# Patient Record
Sex: Male | Born: 1970
Health system: Southern US, Community
[De-identification: ages and names within clinical notes are randomized; demographics above are authoritative.]

## PROBLEM LIST (undated history)

## (undated) DIAGNOSIS — E559 Vitamin D deficiency, unspecified: Secondary | ICD-10-CM

## (undated) DIAGNOSIS — N189 Chronic kidney disease, unspecified: Secondary | ICD-10-CM

## (undated) DIAGNOSIS — I10 Essential (primary) hypertension: Secondary | ICD-10-CM

## (undated) HISTORY — PX: OTHER SURGICAL HISTORY: SHX169

## (undated) HISTORY — DX: Vitamin D deficiency, unspecified: E55.9

## (undated) HISTORY — DX: Essential (primary) hypertension: I10

## (undated) HISTORY — DX: Chronic kidney disease, unspecified: N18.9

---

## 2016-08-06 ENCOUNTER — Emergency Department (HOSPITAL_COMMUNITY)
Admission: EM | Admit: 2016-08-06 | Discharge: 2016-08-06 | Disposition: A | Discharge status: Home / Self Care | Payer: Self-pay | Attending: Emergency Medicine | Admitting: Emergency Medicine

## 2016-08-06 ENCOUNTER — Emergency Department (HOSPITAL_COMMUNITY): Payer: Self-pay

## 2016-08-06 DIAGNOSIS — I160 Hypertensive urgency: Secondary | ICD-10-CM | POA: Insufficient documentation

## 2016-08-06 DIAGNOSIS — I161 Hypertensive emergency: Secondary | ICD-10-CM

## 2016-08-06 DIAGNOSIS — M542 Cervicalgia: Secondary | ICD-10-CM | POA: Insufficient documentation

## 2016-08-06 LAB — CBC WITH DIFFERENTIAL/PLATELET
Basophils Absolute: 0 10*3/uL (ref 0.0–0.1)
Basophils Relative: 0 %
EOS ABS: 0.1 10*3/uL (ref 0.0–0.7)
Eosinophils Relative: 2 %
HCT: 42.8 % (ref 39.0–52.0)
HEMOGLOBIN: 14.2 g/dL (ref 13.0–17.0)
LYMPHS ABS: 1.8 10*3/uL (ref 0.7–4.0)
Lymphocytes Relative: 38 %
MCH: 27.3 pg (ref 26.0–34.0)
MCHC: 33.2 g/dL (ref 30.0–36.0)
MCV: 82.3 fL (ref 78.0–100.0)
MONOS PCT: 11 %
Monocytes Absolute: 0.6 10*3/uL (ref 0.1–1.0)
NEUTROS ABS: 2.4 10*3/uL (ref 1.7–7.7)
NEUTROS PCT: 49 %
Platelets: 260 10*3/uL (ref 150–400)
RBC: 5.2 MIL/uL (ref 4.22–5.81)
RDW: 14.2 % (ref 11.5–15.5)
WBC: 4.9 10*3/uL (ref 4.0–10.5)

## 2016-08-06 LAB — BASIC METABOLIC PANEL
Anion gap: 8 (ref 5–15)
BUN: 15 mg/dL (ref 6–20)
CHLORIDE: 103 mmol/L (ref 101–111)
CO2: 25 mmol/L (ref 22–32)
Calcium: 9.3 mg/dL (ref 8.9–10.3)
Creatinine, Ser: 1.58 mg/dL — ABNORMAL HIGH (ref 0.61–1.24)
GFR calc Af Amer: 59 mL/min — ABNORMAL LOW (ref >60)
GFR calc non Af Amer: 51 mL/min — ABNORMAL LOW (ref >60)
Glucose, Bld: 115 mg/dL — ABNORMAL HIGH (ref 65–99)
POTASSIUM: 4 mmol/L (ref 3.5–5.1)
SODIUM: 136 mmol/L (ref 135–145)

## 2016-08-06 LAB — I-STAT TROPONIN, ED: TROPONIN I, POC: 0.01 ng/mL (ref 0.00–0.08)

## 2016-08-06 LAB — URINALYSIS, ROUTINE W REFLEX MICROSCOPIC
Bilirubin Urine: NEGATIVE
Glucose, UA: NEGATIVE mg/dL
Hgb urine dipstick: NEGATIVE
Ketones, ur: NEGATIVE mg/dL
Leukocytes, UA: NEGATIVE
NITRITE: NEGATIVE
PH: 6 (ref 5.0–8.0)
Protein, ur: NEGATIVE mg/dL
SPECIFIC GRAVITY, URINE: 1.01 (ref 1.005–1.030)

## 2016-08-06 MED ORDER — CLONIDINE HCL 0.2 MG PO TABS
0.2000 mg | ORAL_TABLET | Freq: Once | ORAL | Status: AC
  Administered 2016-08-06: 0.2 mg via ORAL
  Filled 2016-08-06: qty 1

## 2016-08-06 MED ORDER — AMLODIPINE BESYLATE 5 MG PO TABS: 5.0000 mg | ORAL_TABLET | Freq: Every day | ORAL | 0 refills | Status: DC

## 2016-08-06 NOTE — ED Provider Notes (Signed)
[facility] DEPT Provider Note   CSN: [id number] Arrival date & time: 08/06/16  1059     History   Chief Complaint Chief Complaint  Patient presents with  . Generalized Body Aches  . Headache  . Neck Pain    HPI David [name] is a 46 y.o. male.  The history is provided by the patient and medical records. No language interpreter was used.  Headache    Neck Pain   Associated symptoms include headaches. Pertinent negatives include no numbness and no weakness.   David [name] is a 46 y.o. male who presents to the Emergency Department complaining of headache and neck pain that began this morning. Patient states that when he woke up this morning, he didn't feel well. He states that he just felt achy all over, most notably headache and neck pain. Body aches and neck pain feel improved, but headache has been persistent. Headache is aching, left sided. Worse when leaning forward. No visual changes, chest pain, shortness of breath, abdominal pain, n/v, numbness, tingling, weakness, slurred speech, syncopal episodes. No medications taken prior to arrival. He has been told he had high blood pressure back in 2013 in [place]. No other known medical problems. He was started on medication which he took for a month or two, but quit taking it and has not followed up on this. Just moved to [place] and working on getting [employer]. + ppd smoker. No ETOH or elicit drug use.  No past medical history on file.  There are no active problems to display for this patient.   No past surgical history on file.     Home Medications    Prior to Admission medications   Medication Sig Start Date End Date Taking? Authorizing Provider  acetaminophen (TYLENOL) 500 MG tablet Take 1,000 mg by mouth every 6 (six) hours as needed for headache.   Yes [provider]  amLODipine (NORVASC) 5 MG tablet Take 1 tablet (5 mg total) by mouth daily. Take 2.5 mg (half tablet) by mouth daily for 3 days, then  increase to 5 mg (1 tablet) by mouth daily. 08/06/16   Ward, Ozella Almond, PA-C    Family History No family history on file.  Social History Social History  Substance Use Topics  . Smoking status: Not on file  . Smokeless tobacco: Not on file  . Alcohol use Not on file     Allergies   Patient has no known allergies.   Review of Systems Review of Systems  Musculoskeletal: Positive for neck pain.  Neurological: Positive for headaches. Negative for dizziness, syncope, speech difficulty, weakness and numbness.     Physical Exam Updated Vital Signs BP (!) 193/111   Pulse (!) 51   Temp 98.3 F (36.8 C) (Oral)   Resp 16   Ht 5\' 11"  (1.803 m)   Wt 136.1 kg (300 lb)   SpO2 95%   BMI 41.84 kg/m   Physical Exam  Constitutional: He is oriented to person, place, and time. He appears well-developed and well-nourished. No distress.  HENT:  Head: Normocephalic and atraumatic.  Cardiovascular: Normal rate, regular rhythm and normal heart sounds.   No murmur heard. Pulmonary/Chest: Effort normal and breath sounds normal. No respiratory distress. He has no wheezes. He has no rales.  Abdominal: Soft. He exhibits no distension. There is no tenderness.  Musculoskeletal: He exhibits no edema.  Neurological: He is alert and oriented to person, place, and time.  Alert, oriented, thought content appropriate, able to  give a coherent history. Speech is clear and goal oriented, able to follow commands.  Cranial Nerves:  II:  Peripheral visual fields grossly normal, pupils equal, round, reactive to light III, IV, VI: EOM intact bilaterally, ptosis not present V,VII: smile symmetric, eyes kept closed tightly against resistance, facial light touch sensation equal VIII: hearing grossly normal IX, X: symmetric soft palate movement, uvula elevates symmetrically  XI: bilateral shoulder shrug symmetric and strong XII: midline tongue extension 5/5 muscle strength in upper and lower extremities  bilaterally including strong and equal grip strength and dorsiflexion/plantar flexion Sensory to light touch normal in all four extremities.  Normal finger-to-nose and rapid alternating movements; no drift.  Skin: Skin is warm and dry.  Nursing note and vitals reviewed.    ED Treatments / Results  Labs (all labs ordered are listed, but only abnormal results are displayed) Labs Reviewed  BASIC METABOLIC PANEL - Abnormal; Notable for the following:       Result Value   Glucose, Bld 115 (*)    Creatinine, Ser 1.58 (*)    GFR calc non Af Amer 51 (*)    GFR calc Af Amer 59 (*)    All other components within normal limits  URINALYSIS, ROUTINE W REFLEX MICROSCOPIC - Abnormal; Notable for the following:    Color, Urine STRAW (*)    All other components within normal limits  CBC WITH DIFFERENTIAL/PLATELET  I-STAT TROPONIN, ED    EKG  EKG Interpretation None       Radiology Ct Head Wo Contrast  Result Date: 08/06/2016 CLINICAL DATA:  Headache and elevated blood pressure. EXAM: CT HEAD WITHOUT CONTRAST TECHNIQUE: Contiguous axial images were obtained from the base of the skull through the vertex without intravenous contrast. COMPARISON:  None. FINDINGS: Brain: No evidence of acute infarction, hemorrhage, hydrocephalus, extra-axial collection or mass lesion/mass effect. Vascular: No hyperdense vessel or unexpected calcification. Skull: Normal. Negative for fracture or focal lesion. Sinuses/Orbits: No acute finding. Other: None. IMPRESSION: 1. No acute intracranial abnormalities identified.  Normal brain. Electronically Signed   By: Kerby Moors M.D.   On: 08/06/2016 18:24    Procedures Procedures (including critical care time)  Medications Ordered in ED Medications  cloNIDine (CATAPRES) tablet 0.2 mg (0.2 mg Oral Given 08/06/16 1536)     Initial Impression / Assessment and Plan / ED Course  I have reviewed the triage vital signs and the nursing notes.  Pertinent labs & imaging  results that were available during my care of the patient were reviewed by me and considered in my medical decision making (see chart for details).    David [name] is a 46 y.o. male who presents to ED for headache. Hx of HTN and started on medication in [place] back in 2013. Unfortunately he does not know name of medication and took it only for a month or two, then never followed up on BP. Blood pressure of 200/115 upon arrival with pulse of 79. Afebrile. No neuro deficits, however does endorse headache. CT head negative. BMP with creatinine of 1.58, and no baseline comparison. Troponin negative. No protein in urine. Catapres given in ED with improvement of blood pressure to 175/108. Strongly encouraged patient to be admitted for hypertensive urgency. Patient is self-pay and trying to get Medicaid in [place]. He is very concerned about financial cost of admission. I have discussed my concerns as a provider and the possibility that this may worsen. We discussed the nature, risks and benefits to admission. Time was  given to allow the opportunity to ask questions and consider the options and after the discussion, the patient decided to refuse admission. Pt is A&Ox4, his own POA and states understanding of my concerns and the possible consequences. I spoke at length about reasons to return to emergency department immediately and that he could return at any time. Will start patient on Norvasc. Patient does not have a PCP in the area yet. I put in a consult for case management in the morning. Hopefully they can assist with getting PCP appointment sooner. PCP resources provided as well. All questions answered.   Patient discussed with Dr. Sherry Ruffing who agrees with treatment plan.    Final Clinical Impressions(s) / ED Diagnoses   Final diagnoses:  Hypertensive emergency    New Prescriptions New Prescriptions   AMLODIPINE (NORVASC) 5 MG TABLET    Take 1 tablet (5 mg total) by mouth daily. Take 2.5 mg  (half tablet) by mouth daily for 3 days, then increase to 5 mg (1 tablet) by mouth daily.     Ward, Ozella Almond, PA-C 08/06/16 2058    Tegeler, Gwenyth Allegra, MD 08/07/16 1236

## 2016-08-06 NOTE — Discharge Instructions (Signed)
It was my pleasure taking care of you today!    Your blood pressure was extremely elevated today. I recommended that you be admitted to the hospital, but understand your concerns. We are starting you on blood pressure medication but it is VERY important that you return to the ER for chest pain, trouble breathing, worsening/return of headache, new symptoms or any additional concerns.   You will also need to follow up with a primary care provider. Our case manager should be trying to get you an appointment, so be expecting a phone call some time Monday or Tuesday. If you do not hear from them, please see the information below to call and schedule an appointment.   To find a primary care or specialty doctor please call [phone] or [phone] to access "[facility] Doctor Service."  You may also go on the [facility] website at [email or web address]  There are also multiple [facility] practices throughout the [place] that are frequently accepting new patients. You may find a clinic that is close to your home and contact them.  [facility] - [place] [phone]  [facility] in [place] (also locations in [place] and [place]) - [place] [phone]  [place] [phone]

## 2016-08-06 NOTE — ED Triage Notes (Signed)
Pt. Stated, I woke up this morning with my body hurt all over , my neck and head.

## 2016-08-06 NOTE — ED Notes (Signed)
Pt resting at this time, st's headache is better.  No complaints voiced.

## 2016-08-06 NOTE — ED Notes (Signed)
Pt currently in bathroom

## 2016-08-06 NOTE — ED Notes (Signed)
Pt resting at this time with eyes closed 

## 2016-08-08 ENCOUNTER — Telehealth: Payer: Self-pay | Admitting: Emergency Medicine

## 2016-08-08 NOTE — Telephone Encounter (Signed)
CM consulted on pt with no ins and no PCP.  Called pt, [phone], who verified no ins or PCP and was agreeable with help to establish PCP.  Pt chose [facility] for follow up of the 3 clinic choices.  Appointment made for pt on Monday Aug 6th at 1 pm. Called pt back with appointment time.  No further CM needs noted at this time.

## 2016-08-15 ENCOUNTER — Encounter: Payer: Self-pay | Admitting: Family Medicine

## 2016-08-15 ENCOUNTER — Ambulatory Visit (INDEPENDENT_AMBULATORY_CARE_PROVIDER_SITE_OTHER): Payer: Self-pay | Admitting: Family Medicine

## 2016-08-15 VITALS — BP 140/96 | HR 68 | Temp 97.6°F | Resp 14 | Ht 71.0 in | Wt 226.0 lb

## 2016-08-15 DIAGNOSIS — I10 Essential (primary) hypertension: Secondary | ICD-10-CM

## 2016-08-15 DIAGNOSIS — R809 Proteinuria, unspecified: Secondary | ICD-10-CM

## 2016-08-15 LAB — LIPID PANEL
CHOL/HDL RATIO: 4.2 ratio (ref <5.0)
CHOLESTEROL: 202 mg/dL — AB (ref <200)
HDL: 48 mg/dL (ref >40)
LDL Cholesterol: 134 mg/dL — ABNORMAL HIGH (ref <100)
Triglycerides: 100 mg/dL (ref <150)
VLDL: 20 mg/dL (ref <30)

## 2016-08-15 LAB — POCT URINALYSIS DIP (DEVICE)
Bilirubin Urine: NEGATIVE
Glucose, UA: NEGATIVE mg/dL
HGB URINE DIPSTICK: NEGATIVE
Ketones, ur: NEGATIVE mg/dL
Leukocytes, UA: NEGATIVE
NITRITE: NEGATIVE
PH: 7 (ref 5.0–8.0)
PROTEIN: 30 mg/dL — AB
Specific Gravity, Urine: 1.015 (ref 1.005–1.030)
Urobilinogen, UA: 0.2 mg/dL (ref 0.0–1.0)

## 2016-08-15 LAB — BASIC METABOLIC PANEL
BUN: 13 mg/dL (ref 7–25)
CO2: 25 mmol/L (ref 20–32)
Calcium: 9.4 mg/dL (ref 8.6–10.3)
Chloride: 103 mmol/L (ref 98–110)
Creat: 1.38 mg/dL — ABNORMAL HIGH (ref 0.60–1.35)
GLUCOSE: 95 mg/dL (ref 65–99)
Potassium: 4.6 mmol/L (ref 3.5–5.3)
SODIUM: 137 mmol/L (ref 135–146)

## 2016-08-15 LAB — POCT GLYCOSYLATED HEMOGLOBIN (HGB A1C): HEMOGLOBIN A1C: 5.6

## 2016-08-15 MED ORDER — LISINOPRIL 10 MG PO TABS: 20.0000 mg | ORAL_TABLET | Freq: Every day | ORAL | 1 refills | Status: DC

## 2016-08-15 MED ORDER — AMLODIPINE BESYLATE 10 MG PO TABS: 10.0000 mg | ORAL_TABLET | Freq: Every day | ORAL | 1 refills | Status: DC

## 2016-08-15 MED ORDER — ASPIRIN EC 81 MG PO TBEC: 81.0000 mg | DELAYED_RELEASE_TABLET | Freq: Every day | ORAL | 2 refills | Status: DC

## 2016-08-15 MED ORDER — LISINOPRIL 10 MG PO TABS: 10.0000 mg | ORAL_TABLET | Freq: Every day | ORAL | 1 refills | Status: DC

## 2016-08-15 MED FILL — AMLODIPINE BESYLATE 10 MG T: 10 | 90 days supply | Qty: 90 | Fill #0

## 2016-08-15 MED FILL — LISINOPRIL 10 MG TABLET: 10 | 90 days supply | Qty: 180 | Fill #0

## 2016-08-15 NOTE — Patient Instructions (Addendum)
Hypertension: I am increasing your Amlodipine 10 mg once daily. I am also started on lisinopril 20 mg once daily for blood pressure. Cardiovascular health began taking 81 mg of aspirin once daily. I encourage you to stop smoking and increase vigorous physical activity, to a minimum 150 minutes per week.     Steps to Quit Smoking Smoking tobacco can be bad for your health. It can also affect almost every organ in your body. Smoking puts you and people around you at risk for many serious long-lasting (chronic) diseases. Quitting smoking is hard, but it is one of the best things that you can do for your health. It is never too late to quit. What are the benefits of quitting smoking? When you quit smoking, you lower your risk for getting serious diseases and conditions. They can include:  Lung cancer or lung disease.  Heart disease.  Stroke.  Heart attack.  Not being able to have children (infertility).  Weak bones (osteoporosis) and broken bones (fractures).  If you have coughing, wheezing, and shortness of breath, those symptoms may get better when you quit. You may also get sick less often. If you are pregnant, quitting smoking can help to lower your chances of having a baby of low birth weight. What can I do to help me quit smoking? Talk with your doctor about what can help you quit smoking. Some things you can do (strategies) include:  Quitting smoking totally, instead of slowly cutting back how much you smoke over a period of time.  Going to in-person counseling. You are more likely to quit if you go to many counseling sessions.  Using resources and support systems, such as: ? Database administrator with a Social worker. ? Phone quitlines. ? Careers information officer. ? Support groups or group counseling. ? Text messaging programs. ? Mobile phone apps or applications.  Taking medicines. Some of these medicines may have nicotine in them. If you are pregnant or breastfeeding, do not take  any medicines to quit smoking unless your doctor says it is okay. Talk with your doctor about counseling or other things that can help you.  Talk with your doctor about using more than one strategy at the same time, such as taking medicines while you are also going to in-person counseling. This can help make quitting easier. What things can I do to make it easier to quit? Quitting smoking might feel very hard at first, but there is a lot that you can do to make it easier. Take these steps:  Talk to your family and friends. Ask them to support and encourage you.  Call phone quitlines, reach out to support groups, or work with a Social worker.  Ask people who smoke to not smoke around you.  Avoid places that make you want (trigger) to smoke, such as: ? Bars. ? Parties. ? Smoke-break areas at work.  Spend time with people who do not smoke.  Lower the stress in your life. Stress can make you want to smoke. Try these things to help your stress: ? Getting regular exercise. ? Deep-breathing exercises. ? Yoga. ? Meditating. ? Doing a body scan. To do this, close your eyes, focus on one area of your body at a time from head to toe, and notice which parts of your body are tense. Try to relax the muscles in those areas.  Download or buy apps on your mobile phone or tablet that can help you stick to your quit plan. There are many free apps, such  as QuitGuide from the [employer] Office manager for Disease Control and Prevention). You can find more support from smokefree.gov and other websites.  This information is not intended to replace advice given to you by your health care provider. Make sure you discuss any questions you have with your health care provider. Document Released: 10/23/2008 Document Revised: 08/25/2015 Document Reviewed: 05/13/2014 Elsevier Interactive Patient Education  2018 [employer].  Hypertension Hypertension is another name for high blood pressure. High blood pressure forces your heart  to work harder to pump blood. This can cause problems over time. There are two numbers in a blood pressure reading. There is a top number (systolic) over a bottom number (diastolic). It is best to have a blood pressure below 120/80. Healthy choices can help lower your blood pressure. You may need medicine to help lower your blood pressure if:  Your blood pressure cannot be lowered with healthy choices.  Your blood pressure is higher than 130/80.  Follow these instructions at home: Eating and drinking  If directed, follow the DASH eating plan. This diet includes: ? Filling half of your plate at each meal with fruits and vegetables. ? Filling one quarter of your plate at each meal with whole grains. Whole grains include whole wheat pasta, brown rice, and whole grain bread. ? Eating or drinking low-fat dairy products, such as skim milk or low-fat yogurt. ? Filling one quarter of your plate at each meal with low-fat (lean) proteins. Low-fat proteins include fish, skinless chicken, eggs, beans, and tofu. ? Avoiding fatty meat, cured and processed meat, or chicken with skin. ? Avoiding premade or processed food.  Eat less than 1,500 mg of salt (sodium) a day.  Limit alcohol use to no more than 1 drink a day for nonpregnant women and 2 drinks a day for men. One drink equals 12 oz of beer, 5 oz of wine, or 1 oz of hard liquor. Lifestyle  Work with your doctor to stay at a healthy weight or to lose weight. Ask your doctor what the best weight is for you.  Get at least 30 minutes of exercise that causes your heart to beat faster (aerobic exercise) most days of the week. This may include walking, swimming, or biking.  Get at least 30 minutes of exercise that strengthens your muscles (resistance exercise) at least 3 days a week. This may include lifting weights or pilates.  Do not use any products that contain nicotine or tobacco. This includes cigarettes and e-cigarettes. If you need help  quitting, ask your doctor.  Check your blood pressure at home as told by your doctor.  Keep all follow-up visits as told by your doctor. This is important. Medicines  Take over-the-counter and prescription medicines only as told by your doctor. Follow directions carefully.  Do not skip doses of blood pressure medicine. The medicine does not work as well if you skip doses. Skipping doses also puts you at risk for problems.  Ask your doctor about side effects or reactions to medicines that you should watch for. Contact a doctor if:  You think you are having a reaction to the medicine you are taking.  You have headaches that keep coming back (recurring).  You feel dizzy.  You have swelling in your ankles.  You have trouble with your vision. Get help right away if:  You get a very bad headache.  You start to feel confused.  You feel weak or numb.  You feel faint.  You get very bad pain  in your: ? Chest. ? Belly (abdomen).  You throw up (vomit) more than once.  You have trouble breathing. Summary  Hypertension is another name for high blood pressure.  Making healthy choices can help lower blood pressure. If your blood pressure cannot be controlled with healthy choices, you may need to take medicine. This information is not intended to replace advice given to you by your health care provider. Make sure you discuss any questions you have with your health care provider. Document Released: 06/15/2007 Document Revised: 11/25/2015 Document Reviewed: 11/25/2015 Elsevier Interactive Patient Education  [employer].

## 2016-08-15 NOTE — Progress Notes (Signed)
Patient ID: David [name], male    DOB: 1970/11/28, 46 y.o.   MRN: [id number]  PCP: Scot Jun, FNP  Chief Complaint  Patient presents with  . Establish Care  . Hospitalization Follow-up    blood pressure    Subjective:  HPI David [name] is a 46 y.o. male presents to establish care and hospital follow-up. Pankaj Ruben reports at least a 10 year history of hypertension. He is current, everyday smoker. He previously was able to acquire medications from his home country, however ran out sometime ago. He is uncertain of medication he was previously prescribed for blood pressure control. David [name] presented to the emergency department on 08/06/2016 with a complaint of headache with associated neck pain. While at the ED, patient blood pressure was 193/111. He had a CT of head which was negative of acute findings. [name] was prescribed amlodipine 5 mg and discharged home. Denies experiencing any side effects of medication. He reports today improvement of headache. He is has not experienced any dizziness or visual disturbances.  Social History   Social History  . Marital status: Married    Spouse name: N/A  . Number of children: N/A  . Years of education: N/A   Occupational History  . Not on file.   Social History Main Topics  . Smoking status: Current Every Day Smoker  . Smokeless tobacco: Never Used  . Alcohol use No  . Drug use: No  . Sexual activity: Not on file   Other Topics Concern  . Not on file   Social History Narrative  . No narrative on file   Family History  Problem Relation Age of Onset  . Family history unknown: Yes   Review of Systems See HPI Prior to Admission medications   Medication Sig Start Date End Date Taking? Authorizing Provider  acetaminophen (TYLENOL) 500 MG tablet Take 1,000 mg by mouth every 6 (six) hours as needed for headache.   Yes [provider]  amLODipine (NORVASC) 5 MG tablet Take 1 tablet (5 mg total) by mouth daily. Take 2.5 mg  (half tablet) by mouth daily for 3 days, then increase to 5 mg (1 tablet) by mouth daily. 08/06/16  Yes Ward, Ozella Almond, PA-C   Past Medical, Surgical Family and Social History reviewed and updated.  Objective:   Today's Vitals   08/15/16 1306  BP: (!) 140/96  Pulse: 68  Resp: 14  Temp: 97.6 F (36.4 C)  TempSrc: Oral  SpO2: 99%  Weight: 226 lb (102.5 kg)  Height: 5\' 11"  (1.803 m)    Wt Readings from Last 3 Encounters:  08/15/16 226 lb (102.5 kg)  08/06/16 300 lb (136.1 kg)   Physical Exam  Constitutional: He is oriented to person, place, and time. He appears well-developed and well-nourished.  HENT:  Head: Normocephalic and atraumatic.  Eyes: Pupils are equal, round, and reactive to light. Conjunctivae and EOM are normal.  Neck: Normal range of motion. Neck supple.  Cardiovascular: Normal rate, regular rhythm, normal heart sounds and intact distal pulses.   Pulmonary/Chest: Effort normal and breath sounds normal.  Musculoskeletal: Normal range of motion.  Neurological: He is oriented to person, place, and time. Coordination normal.  Skin: Skin is warm and dry.  Psychiatric: He has a normal mood and affect. His behavior is normal. Thought content normal.   Assessment & Plan:  1. Essential hypertension, stable  - Basic metabolic panel - Thyroid Panel With TSH - Lipid panel - POCT glycosylated hemoglobin (Hb A1C)  Meds ordered this encounter  Medications  . amLODipine (NORVASC) 10 MG tablet    Sig: Take 1 tablet (10 mg total) by mouth daily.    Dispense:  90 tablet    Refill:  1    Order Specific Question:   Supervising Provider    Answer:   Tresa Garter [id number]  . DISCONTD: lisinopril (PRINIVIL,ZESTRIL) 10 MG tablet    Sig: Take 1 tablet (10 mg total) by mouth daily.    Dispense:  90 tablet    Refill:  1    Order Specific Question:   Supervising Provider    Answer:   Tresa Garter [id number]  . aspirin EC 81 MG tablet    Sig: Take 1 tablet  (81 mg total) by mouth daily.    Dispense:  90 tablet    Refill:  2    Order Specific Question:   Supervising Provider    Answer:   Tresa Garter [id number]  . lisinopril (PRINIVIL,ZESTRIL) 10 MG tablet    Sig: Take 2 tablets (20 mg total) by mouth daily.    Dispense:  90 tablet    Refill:  1    Order Specific Question:   Supervising Provider    Answer:   Tresa Garter [id number]    RTC: 4 weeks for blood pressure recheck and 3 months for hypertension follow-up  Carroll Sage. Kenton Kingfisher, MSN, FNP-C [facility]  [address] C[address] [place], [place] [address] [phone]

## 2016-08-15 NOTE — Progress Notes (Deleted)
   Patient ID: David [name], male    DOB: Apr 20, 1970, 46 y.o.   MRN: [id number]  PCP: Scot Jun, FNP    Subjective:  HPI  David [name] is a 46 y.o. male presents for evaluation of No chief complaint on file.    Social History   Social History  . Marital status: Married    Spouse name: N/A  . Number of children: N/A  . Years of education: N/A   Occupational History  . Not on file.   Social History Main Topics  . Smoking status: Current Every Day Smoker  . Smokeless tobacco: Never Used  . Alcohol use No  . Drug use: No  . Sexual activity: Not on file   Other Topics Concern  . Not on file   Social History Narrative  . No narrative on file    No family history on file.   Review of Systems  There are no active problems to display for this patient.   No Known Allergies  Prior to Admission medications   Medication Sig Start Date End Date Taking? Authorizing Provider  acetaminophen (TYLENOL) 500 MG tablet Take 1,000 mg by mouth every 6 (six) hours as needed for headache.   Yes [provider]  amLODipine (NORVASC) 5 MG tablet Take 1 tablet (5 mg total) by mouth daily. Take 2.5 mg (half tablet) by mouth daily for 3 days, then increase to 5 mg (1 tablet) by mouth daily. 08/06/16  Yes Ward, Ozella Almond, PA-C    Past Medical, Surgical Family and Social History reviewed and updated.    Objective:   Today's Vitals   08/15/16 1306  BP: (!) 140/96  Pulse: 68  Resp: 14  Temp: 97.6 F (36.4 C)  TempSrc: Oral  SpO2: 99%  Weight: 226 lb (102.5 kg)  Height: 5\' 11"  (1.803 m)    Wt Readings from Last 3 Encounters:  08/15/16 226 lb (102.5 kg)  08/06/16 300 lb (136.1 kg)    Physical Exam         Assessment & Plan:  There are no diagnoses linked to this encounter.   Carroll Sage. Kenton Kingfisher, MSN, FNP-C [facility]  [address] [place], [place] [address] [phone]

## 2016-08-16 LAB — THYROID PANEL WITH TSH
FREE THYROXINE INDEX: 2.2 (ref 1.4–3.8)
T3 Uptake: 26 % (ref 22–35)
T4 TOTAL: 8.4 ug/dL (ref 4.9–10.5)
TSH: 0.72 m[IU]/L (ref 0.40–4.50)

## 2016-08-18 ENCOUNTER — Other Ambulatory Visit: Payer: Self-pay | Admitting: Family Medicine

## 2016-08-18 MED ORDER — PRAVASTATIN SODIUM 40 MG PO TABS: 40.0000 mg | ORAL_TABLET | Freq: Every evening | ORAL | 2 refills | Status: DC

## 2016-11-18 ENCOUNTER — Ambulatory Visit: Payer: Self-pay | Admitting: Family Medicine

## 2018-02-26 ENCOUNTER — Encounter (HOSPITAL_COMMUNITY): Payer: Self-pay

## 2018-02-26 ENCOUNTER — Ambulatory Visit (HOSPITAL_COMMUNITY)
Admission: EM | Admit: 2018-02-26 | Discharge: 2018-02-26 | Disposition: A | Discharge status: Home / Self Care | Payer: Self-pay

## 2018-02-26 ENCOUNTER — Encounter (HOSPITAL_COMMUNITY): Payer: Self-pay | Admitting: Emergency Medicine

## 2018-02-26 ENCOUNTER — Other Ambulatory Visit: Payer: Self-pay

## 2018-02-26 ENCOUNTER — Inpatient Hospital Stay (HOSPITAL_COMMUNITY): Payer: Self-pay

## 2018-02-26 ENCOUNTER — Inpatient Hospital Stay (HOSPITAL_COMMUNITY)
Admission: EM | Admit: 2018-02-26 | Discharge: 2018-03-03 | DRG: 305 | Disposition: A | Discharge status: Home / Self Care | Payer: Self-pay | Attending: Internal Medicine | Admitting: Internal Medicine

## 2018-02-26 DIAGNOSIS — Z8249 Family history of ischemic heart disease and other diseases of the circulatory system: Secondary | ICD-10-CM

## 2018-02-26 DIAGNOSIS — R310 Gross hematuria: Secondary | ICD-10-CM | POA: Diagnosis present

## 2018-02-26 DIAGNOSIS — E7220 Disorder of urea cycle metabolism, unspecified: Secondary | ICD-10-CM | POA: Diagnosis present

## 2018-02-26 DIAGNOSIS — D696 Thrombocytopenia, unspecified: Secondary | ICD-10-CM | POA: Diagnosis present

## 2018-02-26 DIAGNOSIS — E875 Hyperkalemia: Secondary | ICD-10-CM | POA: Diagnosis present

## 2018-02-26 DIAGNOSIS — I161 Hypertensive emergency: Principal | ICD-10-CM

## 2018-02-26 DIAGNOSIS — Z9114 Patient's other noncompliance with medication regimen: Secondary | ICD-10-CM

## 2018-02-26 DIAGNOSIS — R319 Hematuria, unspecified: Secondary | ICD-10-CM | POA: Diagnosis present

## 2018-02-26 DIAGNOSIS — N179 Acute kidney failure, unspecified: Secondary | ICD-10-CM

## 2018-02-26 DIAGNOSIS — Z823 Family history of stroke: Secondary | ICD-10-CM

## 2018-02-26 DIAGNOSIS — I129 Hypertensive chronic kidney disease with stage 1 through stage 4 chronic kidney disease, or unspecified chronic kidney disease: Secondary | ICD-10-CM | POA: Diagnosis present

## 2018-02-26 DIAGNOSIS — N183 Chronic kidney disease, stage 3 (moderate): Secondary | ICD-10-CM | POA: Diagnosis present

## 2018-02-26 DIAGNOSIS — Z716 Tobacco abuse counseling: Secondary | ICD-10-CM

## 2018-02-26 DIAGNOSIS — I10 Essential (primary) hypertension: Secondary | ICD-10-CM

## 2018-02-26 DIAGNOSIS — E876 Hypokalemia: Secondary | ICD-10-CM | POA: Diagnosis present

## 2018-02-26 DIAGNOSIS — F17200 Nicotine dependence, unspecified, uncomplicated: Secondary | ICD-10-CM | POA: Diagnosis present

## 2018-02-26 DIAGNOSIS — F17210 Nicotine dependence, cigarettes, uncomplicated: Secondary | ICD-10-CM | POA: Diagnosis present

## 2018-02-26 DIAGNOSIS — N289 Disorder of kidney and ureter, unspecified: Secondary | ICD-10-CM

## 2018-02-26 LAB — URINALYSIS, ROUTINE W REFLEX MICROSCOPIC

## 2018-02-26 LAB — CBC WITH DIFFERENTIAL/PLATELET
Abs Immature Granulocytes: 0.02 10*3/uL (ref 0.00–0.07)
Basophils Absolute: 0 10*3/uL (ref 0.0–0.1)
Basophils Relative: 0 %
Eosinophils Absolute: 0.1 10*3/uL (ref 0.0–0.5)
Eosinophils Relative: 1 %
HCT: 44.3 % (ref 39.0–52.0)
Hemoglobin: 14.1 g/dL (ref 13.0–17.0)
Immature Granulocytes: 0 %
Lymphocytes Relative: 26 %
Lymphs Abs: 1.5 10*3/uL (ref 0.7–4.0)
MCH: 27.4 pg (ref 26.0–34.0)
MCHC: 31.8 g/dL (ref 30.0–36.0)
MCV: 86 fL (ref 80.0–100.0)
Monocytes Absolute: 0.8 10*3/uL (ref 0.1–1.0)
Monocytes Relative: 14 %
Neutro Abs: 3.4 10*3/uL (ref 1.7–7.7)
Neutrophils Relative %: 59 %
Platelets: 100 10*3/uL — ABNORMAL LOW (ref 150–400)
RBC: 5.15 MIL/uL (ref 4.22–5.81)
RDW: 15.1 % (ref 11.5–15.5)
WBC: 5.7 10*3/uL (ref 4.0–10.5)
nRBC: 0 % (ref 0.0–0.2)

## 2018-02-26 LAB — URINALYSIS, MICROSCOPIC (REFLEX): Squamous Epithelial / LPF: NONE SEEN (ref 0–5)

## 2018-02-26 LAB — COMPREHENSIVE METABOLIC PANEL
ALT: 12 U/L (ref 0–44)
AST: 21 U/L (ref 15–41)
Albumin: 3.9 g/dL (ref 3.5–5.0)
Alkaline Phosphatase: 66 U/L (ref 38–126)
Anion gap: 11 (ref 5–15)
BUN: 35 mg/dL — AB (ref 6–20)
CO2: 26 mmol/L (ref 22–32)
CREATININE: 4.06 mg/dL — AB (ref 0.61–1.24)
Calcium: 9.1 mg/dL (ref 8.9–10.3)
Chloride: 97 mmol/L — ABNORMAL LOW (ref 98–111)
GFR calc Af Amer: 19 mL/min — ABNORMAL LOW (ref >60)
GFR calc non Af Amer: 16 mL/min — ABNORMAL LOW (ref >60)
Glucose, Bld: 159 mg/dL — ABNORMAL HIGH (ref 70–99)
POTASSIUM: 3.2 mmol/L — AB (ref 3.5–5.1)
SODIUM: 134 mmol/L — AB (ref 135–145)
Total Bilirubin: 1.1 mg/dL (ref 0.3–1.2)
Total Protein: 6.5 g/dL (ref 6.5–8.1)

## 2018-02-26 LAB — CK TOTAL AND CKMB (NOT AT ARMC)
CK, MB: 2.4 ng/mL (ref 0.5–5.0)
Relative Index: 1.7 (ref 0.0–2.5)
Total CK: 141 U/L (ref 49–397)

## 2018-02-26 MED ORDER — HEPARIN SODIUM (PORCINE) 5000 UNIT/ML IJ SOLN
5000.0000 [IU] | Freq: Three times a day (TID) | INTRAMUSCULAR | Status: DC
  Administered 2018-02-26 – 2018-03-03 (×13): 5000 [IU] via SUBCUTANEOUS
  Filled 2018-02-26 (×13): qty 1

## 2018-02-26 MED ORDER — LABETALOL HCL 5 MG/ML IV SOLN
20.0000 mg | Freq: Once | INTRAVENOUS | Status: AC
  Administered 2018-02-26: 20 mg via INTRAVENOUS
  Filled 2018-02-26: qty 4

## 2018-02-26 MED ORDER — NICARDIPINE HCL IN NACL 20-0.86 MG/200ML-% IV SOLN
3.0000 mg/h | INTRAVENOUS | Status: DC
  Administered 2018-02-26 – 2018-02-27 (×3): 5 mg/h via INTRAVENOUS
  Administered 2018-02-27: 2.5 mg/h via INTRAVENOUS
  Filled 2018-02-26 (×5): qty 200

## 2018-02-26 MED ORDER — POTASSIUM CHLORIDE 20 MEQ PO PACK
20.0000 meq | PACK | Freq: Once | ORAL | Status: AC
  Administered 2018-02-26: 20 meq via ORAL
  Filled 2018-02-26: qty 1

## 2018-02-26 MED ORDER — SODIUM CHLORIDE 0.9% FLUSH
3.0000 mL | Freq: Two times a day (BID) | INTRAVENOUS | Status: DC
  Administered 2018-02-26 – 2018-03-03 (×9): 3 mL via INTRAVENOUS

## 2018-02-26 MED ORDER — KETOROLAC TROMETHAMINE 15 MG/ML IJ SOLN
10.0000 mg | Freq: Once | INTRAMUSCULAR | Status: AC
  Administered 2018-02-26: 10 mg via INTRAVENOUS
  Filled 2018-02-26: qty 1

## 2018-02-26 MED ORDER — HYDROMORPHONE HCL 1 MG/ML IJ SOLN
0.5000 mg | Freq: Once | INTRAMUSCULAR | Status: AC
  Administered 2018-02-26: 0.5 mg via INTRAVENOUS
  Filled 2018-02-26: qty 1

## 2018-02-26 MED ORDER — SODIUM CHLORIDE 0.9 % IV BOLUS
500.0000 mL | Freq: Once | INTRAVENOUS | Status: AC
  Administered 2018-02-26: 500 mL via INTRAVENOUS

## 2018-02-26 MED ORDER — SODIUM CHLORIDE 0.9% FLUSH
3.0000 mL | Freq: Once | INTRAVENOUS | Status: AC
  Administered 2018-02-26: 3 mL via INTRAVENOUS

## 2018-02-26 NOTE — ED Notes (Signed)
Pt instructed to use urinal or bathroom to provide urine sample when possible. Pt verbalized understanding.

## 2018-02-26 NOTE — ED Notes (Signed)
Paged admitting to [facility] @ [phone]

## 2018-02-26 NOTE — ED Triage Notes (Signed)
Pt sts HA and hematuria x 3 days

## 2018-02-26 NOTE — ED Triage Notes (Signed)
Pt reports he has had nightly fever and over the weekend he began noticing blood in his urine. Vitals stable in triage, afebrile.

## 2018-02-26 NOTE — ED Triage Notes (Signed)
Pt to go to ED for further eval per MM

## 2018-02-26 NOTE — H&P (Addendum)
History and Physical   David [name] BUL:[id number] DOB: Jan 30, 1970 DOA: 02/26/2018  PCP: Scot Jun, FNP  Chief Complaint: Headache and peeing blood  HPI: This is a 48 year old man with medical problems including current everyday smoker and hypertension presenting with gross hematuria and headaches.  History is obtained via chart review as well as patient and family report in addition to talking to the emergency medicine team.  The patient reports his native country as being the [place] in [place].  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  Lives at home with his 5 children and spouse, does not drink alcohol, is a current everyday smoker.  He reports being diagnosed with hypertension in the distant past but has not been taking medications in over a year.  He reports not having any physical symptoms that affect his daily life.  However, he developed onset of headache 2 days prior to admission, this recurred despite use of acetaminophen.  He started to develop what he perceived as gross hematuria which prompted him to seek medical care.  He presented to the urgent care who recommended emergency department evaluation for further management.  He specifically denies chest pain, shortness of breath, fevers, chills, nausea, vomiting, diarrhea.  He reports that his mother and father both died of problems related to hypertension.  He reports being fluent in [place] and prefers to converse in this language when interpreter services are offered.  Chart review reveals a creatinine of 1.4 in August 2018.    ED Course: Presenting blood pressure was 226/149, normal heart rate.  CMP remarkable for potassium of 3.2, creatinine of 4.1, BUN of 35.  CK within normal limits.  CBC with plt of 100,000. The patient was given IV hydromorphone, Toradol, IV labetalol, and hospital medicine was consulted for further management.  Urinalysis revealed red appearance, there was  interference to the test, but RBCs were noted.  Review of Systems: A complete ROS was obtained; pertinent positives negatives are denoted in the HPI. Otherwise, all systems are negative.   Past Medical History:  Diagnosis Date  . Hypertension    Social History   Socioeconomic History  . Marital status: Married    Spouse name: Not on file  . Number of children: Not on file  . Years of education: Not on file  . Highest education level: Not on file  Occupational History  . Not on file  Social Needs  . Financial resource strain: Not on file  . Food insecurity:    Worry: Not on file    Inability: Not on file  . Transportation needs:    Medical: Not on file    Non-medical: Not on file  Tobacco Use  . Smoking status: Current Every Day Smoker  . Smokeless tobacco: Never Used  Substance and Sexual Activity  . Alcohol use: No  . Drug use: No  . Sexual activity: Not on file  Lifestyle  . Physical activity:    Days per week: Not on file    Minutes per session: Not on file  . Stress: Not on file  Relationships  . Social connections:    Talks on phone: Not on file    Gets together: Not on file    Attends religious service: Not on file    Active member of club or organization: Not on file    Attends meetings of clubs or organizations: Not on file    Relationship status: Not  on file  . Intimate partner violence:    Fear of current or ex partner: Not on file    Emotionally abused: Not on file    Physically abused: Not on file    Forced sexual activity: Not on file  Other Topics Concern  . Not on file  Social History Narrative  . Not on file   Family hx: Father died at the age of 26 from complications related to high blood pressure, mother died at age 69 of complications from hypertension.  Physical Exam: Vitals:   02/26/18 2115 02/26/18 2230 02/26/18 2245 02/26/18 2300  BP: (!) 195/171 (!) 206/133 (!) 177/129 (!) 178/121  Pulse: 74 61 70 73  Resp:      Temp:        TempSrc:      SpO2: 95% 99% 99% 99%   General: Appears calm and comfortable, pleasant black man ENT: Grossly normal hearing, MMM. Cardiovascular: S1, S2 present, questionable S4, no M/R/G. No LE edema.  Respiratory: CTA bilaterally. No wheezes or crackles. Normal respiratory effort.  Breathing room air. Abdomen: Soft, non-tender no guarding or rebound. Skin: No rash or induration seen on limited exam. Musculoskeletal: Grossly normal tone BUE/BLE. Appropriate ROM.  Psychiatric: Grossly normal mood and affect.  Is pleasant and cooperative. Neurologic: Moves all extremities in coordinated fashion.  I have personally reviewed the following labs, culture data, and imaging studies.  Assessment/Plan:  #Hypertensive emergency with acute kidney injury / acute renal failure, suspected Course: Cr in 3818 of 1.5; uncertain recent baseline, presenting with SBP in 220-230 mm Hg range with associated end organ damage of Cr of 4.5, gross hematuria per report, RBCs found on UA. A/P: After discussing the case with nephrology consult service, have initiated parenteral anti-HTN with nicardipine with goal of decreasing SBP by 10-20% in first 24 hours.  Will obtain EKG, as well as other studies as outlined by nephrology team (aldosterone, renin, TSH, cortisol, renal duplex).  Renal diet.  Strict I and Os, Foley catheter for close monitoring.  Avoidance of nephrotoxins.  While some of his elevated Cr can be attributed to possible CKD, believe there is a component related to HTN renal crisis / emergency at this time.  After writing admission orders, nursing staff relayed that the ICU setting is required for therapeutic titratable nicardipine gtt; therefore, ICU team consulted who will evaluate the patient and take to the ICU setting if they deem appropriate.  #Other problems: -Hypokalemia, mild: K of 3.2 on admission; will provide 30 MEQ and continue to monitor -Smoking: continue cessation  efforts -Thrombocytopenia: mild at 100,000; monitor, normal hb, clinical picture fits more HTN process than TMA - nephrology concurs  DVT prophylaxis: Subq heparin Code Status: full Disposition Plan: Anticipate D/C home when medically ready Consults called: nephrology and ICU team Admission status: originally admitted to progressive care / step down unit, but ICU team evaluating for transfer / re-triage to the ICU setting   Cheri Rous, MD [facility] Page:[phone]  If 7PM-7AM, please contact night-coverage [email or web address] Password [email or web address]  This document was created using the aid of voice recognition / dication software.

## 2018-02-26 NOTE — Consult Note (Signed)
Reason for Consult: AKI/CKD in setting of hypertensive emergency Referring Physician: Stana Bunting, MD  David [name] is an 48 y.o. male.  HPI: David [name] is a 48 yo male from the [place] with a PMH significant for longstanding, poorly controlled HTN and CKD stage 3 (Cr 1.4-1.6 in 2018) who presented to Urgent Care earlier this morning with a  3 day history of headache not relieved with tylenol and gross hematuria last night.  He reports that he wasn't feeling well Saturday and then had an headache that radiated from the top of his head down to his neck.  He took tylenol with little benefit and then it persisted into Sunday and today.  He noticed blood in his urine around midnight and knew that he needed to see someone so he went to Urgent Care this morning and was noted to have a BP of 226/149 and was transferred to [facility] ED for further evaluation and management.  We were consulted due to the development of gross hematuria, AKI/CKD, and assistance with managing his hypertensive emergency.  The trend in Scr is seen below.    He denies any family history of CKD but both of his parents died from strokes related to HTN.  He does smoke 1 1/2 packs of cigarettes a day but denies any etoh or illicit drugs.  He stopped drinking coffee a month ago.  He also stopped his BP meds over a year ago and has not seen a physician since.  He denies any epistaxis, blurred vision, nausea, vomiting, chest pain/pressure, shortness of breath, dysuria, pyuria, urgency, frequency, retention, or flank pain.  He also denies any edema or NSAID use.  Trend in Creatinine: Creatinine, Ser  Date/Time Value Ref Range Status  02/26/2018 05:15 PM 4.06 (H) 0.61 - 1.24 mg/dL Final  08/15/2016 01:32 PM 1.38 (H) 0.60 - 1.35 mg/dL Final  08/06/2016 11:35 AM 1.58 (H) 0.61 - 1.24 mg/dL Final    PMH:   Past Medical History:  Diagnosis Date  . Hypertension     PSH:  History reviewed. No pertinent surgical history.  Allergies: No Known  Allergies  Medications:   Prior to Admission medications   Medication Sig Start Date End Date Taking? Authorizing Provider  amLODipine (NORVASC) 10 MG tablet Take 1 tablet (10 mg total) by mouth daily. Patient not taking: Reported on 02/26/2018 08/15/16   Scot Jun, FNP  aspirin EC 81 MG tablet Take 1 tablet (81 mg total) by mouth daily. Patient not taking: Reported on 02/26/2018 08/15/16   Scot Jun, FNP  lisinopril (PRINIVIL,ZESTRIL) 10 MG tablet Take 2 tablets (20 mg total) by mouth daily. Patient not taking: Reported on 02/26/2018 08/15/16   Scot Jun, FNP    Inpatient medications: . heparin  5,000 Units Subcutaneous Q8H  . sodium chloride flush  3 mL Intravenous Q12H    Discontinued Meds:   Medications Discontinued During This Encounter  Medication Reason  . acetaminophen (TYLENOL) 500 MG tablet Patient Preference  . pravastatin (PRAVACHOL) 40 MG tablet Patient Preference    Social History:  reports that he has been smoking. He has never used smokeless tobacco. He reports that he does not drink alcohol or use drugs.  Family History:   Family History  Family history unknown: Yes    Pertinent items are noted in HPI. Weight change:  No intake or output data in the 24 hours ending 02/26/18 2212 BP (!) 191/130   Pulse 61   Temp 98.4 F (36.9 C) (  Oral)   Resp 18   SpO2 98%  Vitals:   02/26/18 1845 02/26/18 1915 02/26/18 1930 02/26/18 1945  BP: (!) 217/148 (!) 194/133 (!) 193/131 (!) 191/130  Pulse: 68 (!) 59 62 61  Resp:      Temp:      TempSrc:      SpO2: 100% 98% 98% 98%     General appearance: alert, cooperative and no distress Head: Normocephalic, without obvious abnormality, atraumatic Eyes: negative findings: lids and lashes normal, conjunctivae and sclerae normal, corneas clear, pupils equal, round, reactive to light and accomodation and optic nerve appearance unremarkable, positive findings: fundi: A-V nicking both eyes, copper wiring but  no hemorrhages Neck: no adenopathy, no carotid bruit, no JVD, supple, symmetrical, trachea midline and thyroid not enlarged, symmetric, no tenderness/mass/nodules Resp: clear to auscultation bilaterally Cardio: regular rate and rhythm and no rub GI: soft, non-tender; bowel sounds normal; no masses,  no organomegaly Extremities: extremities normal, atraumatic, no cyanosis or edema  Labs: Basic Metabolic Panel: Recent Labs  Lab 02/26/18 1715  NA 134*  K 3.2*  CL 97*  CO2 26  GLUCOSE 159*  BUN 35*  CREATININE 4.06*  ALBUMIN 3.9  CALCIUM 9.1   Liver Function Tests: Recent Labs  Lab 02/26/18 1715  AST 21  ALT 12  ALKPHOS 66  BILITOT 1.1  PROT 6.5  ALBUMIN 3.9   No results for input(s): LIPASE, AMYLASE in the last 168 hours. No results for input(s): AMMONIA in the last 168 hours. CBC: Recent Labs  Lab 02/26/18 1302  WBC 5.7  NEUTROABS 3.4  HGB 14.1  HCT 44.3  MCV 86.0  PLT 100*   PT/INR: @LABRCNTIP (inr:5) Cardiac Enzymes: ) Recent Labs  Lab 02/26/18 1820  CKTOTAL 141  CKMB 2.4   CBG: No results for input(s): GLUCAP in the last 168 hours.  Iron Studies: No results for input(s): IRON, TIBC, TRANSFERRIN, FERRITIN in the last 168 hours.  Xrays/Other Studies: No results found.   Assessment/Plan: 1.  AKI/CKD vs progressive CKD due to poorly controlled HTN.  Unclear if this will be reversible and his renal function will likely get worse with BP control.  No indication for HD at this time and will continue to follow closely.  Unfortunately he also received toradol in the ED and would avoid any further use of NSAIDs, or COX-II I's.   2. Gross hematuria- presumably due to hypertensive renal emergency.  Will order renal US and consider renal artery duplex to r/o RAS.  Need to slowly lower BP to prevent ischemic ATN.  Will hold off on acute GN workup for now and follow.  3. Hypertensive emergency- agree with admission to SDU/ICU to initiate nicardipine drip with goal  BP of 190/120 for the first hour and gradual reduction to 180/100 for the next 23 hours to help prevent worsening renal function and ischemic damage.  Ok to use 5mg /hr and titrate as needed.  Avoid sudden drops in BP.  Hopefully can convert to oral agents in the next 24 hours.  He will also need further cardiac workup and smoking cessation.  I also stressed the importance of compliance with BP medications and physician follow up. 4. Thrombocytopenia- presumably due to hypertensive emergency (TMA).  Doubt HUS/TTP as he is otherwise asymptomatic.  Continue to follow with BP control. Further workup per primary svc 5. Hypokalemia- will check renin and aldosterone levels, replete gently and follow.    Donetta Potts, MD [facility] [phone] 02/26/2018, 10:12 PM

## 2018-02-26 NOTE — Consult Note (Signed)
NAME:  Rohan Juenger, MRN:  [id number], DOB:  September 11, 1970, LOS: 0 ADMISSION DATE:  02/26/2018, CONSULTATION DATE:  02/26/18 REFERRING MD:  Shana Chute, CHIEF COMPLAINT:  HA  Brief History   48 year old male with PMH who presented to the ED 2/17 with HA and hematuria, after initially presenting to Urgent Care and noted to have BP 226/149. He was given 1x labetalol and later started on cardene gtt. PCCM asked to consult   History of present illness   48 year old male PMH HTN CKD and tobacco use disorder who presented to ED 2/17 with HA and hematuria. HA began two 2/15, and did not resolve with acetaminophen. He later developed hematuria at which time he presented to urgent care. Urgent care referred patient to ED for further care for BP 226/149 Of note the patient ceased taking antihypertensive medications over 1 year ago. He smokes 1.5 ppd. He denies chest pain, dyspnea, blurred vision, slurred speech, urinary frequency, n/v.  In the ED patient found to be hypertensive. Patient given 1x labetalol and later started on cardene gtt for hypertension.   PCCM asked to consult after patient started on cardene gtt.    Past Medical History  HTN  Significant Hospital Events   2/17 > admitted  Consults:  PCCM  Nephrology  Procedures:    Significant Diagnostic Tests:  2/17 Renal US >>>   Micro Data:  2/17 UCx>>>   Antimicrobials:    Interim history/subjective:  PCCM asked to consult after patient started on cardene gtt for BP 178/121  Objective   Blood pressure (!) 177/119, pulse 76, temperature 98.4 F (36.9 C), temperature source Oral, resp. rate 18, SpO2 98 %.       No intake or output data in the 24 hours ending 02/26/18 2338 There were no vitals filed for this visit.  Examination: General: WDWN adult male, NAD  HENT: NCAT, mmm, anicteric sclera, trachea midline  Lungs: CTA bilaterally, no accessory muscle recruitment or increased work of breathing Cardiovascular: RRR, s1s2  no r/g/m. 2+ radial pulses Abdomen: soft, non-tender, non-distended, normoactive x4  Extremities: symmetrical bulk and one, no obvious joint deformity, no peripheral edema Neuro: AAO x4 following commands. 5/5 BUE BLE strength.  Skin: clean, dry, warm, intact   Resolved Hospital Problem list     Assessment & Plan:   Hypertensive Crisis  -underlying HTN, non-compliant with home medication  -initial SBP>220, received 1x labetalol then started on cardene gtt P -Admit to ICU, anticipate transfer to SDU after able to control BP on PO regimen, hopefully tomorrow -continue cardene gtt, with goal of reducing BP by 10-20% in first 24 hours -Goal SBP for next 23 hours 180 -plan to convert to PO reg in AM, will stay on cardene gtt overnight  -As per nephrology, follow up aldosterone, renin, TSH, cortisol -Continue tele monitoring  -ECHO -Will need plan for outpatient management of HTN  (previously taking norvasc 10mg  qD and lisinopril 20mg  qD, however has not taken >1 year)    Acute Kidney Injury -possible underlying CKD vs end organ damage associated with hypertensive emergency  -Presenting Cr 4.5 (prior from 2018 1.5, unknown recent baseline however) -hematuria, with RBCs on UA P Nephrology following, appreciate recs  SBP goals as above, important to slowly lower BP to goal to prevent ischemic damage. Renal US ordered to rule out RAS Avoid nephrotoxic medication, including further use of NSAIDs/Cox II-inhibitors  Trend CMP Strict I/O  If requiring fluids, favor PO however if requiring  IVF, favor LR instead of NS Renal Diet   Electrolyte Abnormality -Hypokalemia P -s/p repletion -follow up CMP -Replace PRN   Thrombocytopenia -possibly due to hypertensive emergency  -Some RBC in urine, no known bleeding otherwise  P Follow CBC If worsening, may warrant further investigation or OP workup.   Best practice:  Diet: Renal diet  Pain/Anxiety/Delirium protocol (if indicated):  n/a VAP protocol (if indicated): n/a DVT prophylaxis: heparin sq GI prophylaxis: n/a Glucose control: monitor Mobility: assist Code Status: Full Family Communication: none at bedside Disposition: admit to ICU  Labs   CBC: Recent Labs  Lab 02/26/18 1302  WBC 5.7  NEUTROABS 3.4  HGB 14.1  HCT 44.3  MCV 86.0  PLT 100*    Basic Metabolic Panel: Recent Labs  Lab 02/26/18 1715  NA 134*  K 3.2*  CL 97*  CO2 26  GLUCOSE 159*  BUN 35*  CREATININE 4.06*  CALCIUM 9.1   GFR: CrCl cannot be calculated (Unknown ideal weight.). Recent Labs  Lab 02/26/18 1302  WBC 5.7    Liver Function Tests: Recent Labs  Lab 02/26/18 1715  AST 21  ALT 12  ALKPHOS 66  BILITOT 1.1  PROT 6.5  ALBUMIN 3.9   No results for input(s): LIPASE, AMYLASE in the last 168 hours. No results for input(s): AMMONIA in the last 168 hours.  ABG No results found for: PHART, PCO2ART, PO2ART, HCO3, TCO2, ACIDBASEDEF, O2SAT   Coagulation Profile: No results for input(s): INR, PROTIME in the last 168 hours.  Cardiac Enzymes: Recent Labs  Lab 02/26/18 1820  CKTOTAL 141  CKMB 2.4    HbA1C: Hemoglobin A1C  Date/Time Value Ref Range Status  08/15/2016 01:27 PM 5.6  Final    CBG: No results for input(s): GLUCAP in the last 168 hours.  Review of Systems:   Endorses HA beginning 2 days ago Endorses hematuria Denies dizziness, weakness, confusion, slurred speech Denies chest pain, palpitations, shortness of breath Denies urinary urgency, urinary frequency Denies epistaxis Denies n/v/d Denies recent illness or exposure to ill persons   Past Medical History  He,  has a past medical history of Hypertension.   Surgical History   History reviewed. No pertinent surgical history.   Social History   reports that he has been smoking. He has never used smokeless tobacco. He reports that he does not drink alcohol or use drugs.   Family History   His Family history is unknown by patient.    Allergies No Known Allergies   Home Medications  Prior to Admission medications   Medication Sig Start Date End Date Taking? Authorizing Provider  amLODipine (NORVASC) 10 MG tablet Take 1 tablet (10 mg total) by mouth daily. Patient not taking: Reported on 02/26/2018 08/15/16   Scot Jun, FNP  aspirin EC 81 MG tablet Take 1 tablet (81 mg total) by mouth daily. Patient not taking: Reported on 02/26/2018 08/15/16   Scot Jun, FNP  lisinopril (PRINIVIL,ZESTRIL) 10 MG tablet Take 2 tablets (20 mg total) by mouth daily. Patient not taking: Reported on 02/26/2018 08/15/16   Scot Jun, FNP     Critical care time: 30 minutes     Eliseo Gum MSN, AGACNP-BC [place] 02/26/2018, 11:38 PM

## 2018-02-27 ENCOUNTER — Inpatient Hospital Stay (HOSPITAL_COMMUNITY): Payer: Self-pay

## 2018-02-27 DIAGNOSIS — I161 Hypertensive emergency: Secondary | ICD-10-CM

## 2018-02-27 DIAGNOSIS — N179 Acute kidney failure, unspecified: Secondary | ICD-10-CM | POA: Diagnosis present

## 2018-02-27 DIAGNOSIS — N171 Acute kidney failure with acute cortical necrosis: Secondary | ICD-10-CM

## 2018-02-27 LAB — CBC
HCT: 42.3 % (ref 39.0–52.0)
Hemoglobin: 13.8 g/dL (ref 13.0–17.0)
MCH: 27.5 pg (ref 26.0–34.0)
MCHC: 32.6 g/dL (ref 30.0–36.0)
MCV: 84.3 fL (ref 80.0–100.0)
PLATELETS: 97 10*3/uL — AB (ref 150–400)
RBC: 5.02 MIL/uL (ref 4.22–5.81)
RDW: 15 % (ref 11.5–15.5)
WBC: 4.8 10*3/uL (ref 4.0–10.5)
nRBC: 0 % (ref 0.0–0.2)

## 2018-02-27 LAB — COMPREHENSIVE METABOLIC PANEL
ALT: 13 U/L (ref 0–44)
ANION GAP: 11 (ref 5–15)
AST: 20 U/L (ref 15–41)
Albumin: 4 g/dL (ref 3.5–5.0)
Alkaline Phosphatase: 70 U/L (ref 38–126)
BUN: 31 mg/dL — ABNORMAL HIGH (ref 6–20)
CO2: 25 mmol/L (ref 22–32)
Calcium: 9.3 mg/dL (ref 8.9–10.3)
Chloride: 99 mmol/L (ref 98–111)
Creatinine, Ser: 3.77 mg/dL — ABNORMAL HIGH (ref 0.61–1.24)
GFR calc Af Amer: 21 mL/min — ABNORMAL LOW (ref >60)
GFR, EST NON AFRICAN AMERICAN: 18 mL/min — AB (ref >60)
Glucose, Bld: 117 mg/dL — ABNORMAL HIGH (ref 70–99)
Potassium: 3.1 mmol/L — ABNORMAL LOW (ref 3.5–5.1)
Sodium: 135 mmol/L (ref 135–145)
Total Bilirubin: 1.2 mg/dL (ref 0.3–1.2)
Total Protein: 7.2 g/dL (ref 6.5–8.1)

## 2018-02-27 LAB — HIV ANTIBODY (ROUTINE TESTING W REFLEX): HIV Screen 4th Generation wRfx: NONREACTIVE

## 2018-02-27 LAB — ECHOCARDIOGRAM COMPLETE
Height: 67.323 in
Weight: 3365.1 oz

## 2018-02-27 LAB — URINE CULTURE: Culture: NO GROWTH

## 2018-02-27 LAB — CORTISOL: Cortisol, Plasma: 10 ug/dL

## 2018-02-27 LAB — PROTIME-INR
INR: 1
Prothrombin Time: 13.1 seconds (ref 11.4–15.2)

## 2018-02-27 LAB — TSH: TSH: 3.071 u[IU]/mL (ref 0.350–4.500)

## 2018-02-27 LAB — MRSA PCR SCREENING: MRSA BY PCR: NEGATIVE

## 2018-02-27 LAB — GLUCOSE, CAPILLARY: Glucose-Capillary: 127 mg/dL — ABNORMAL HIGH (ref 70–99)

## 2018-02-27 MED ORDER — CARVEDILOL 12.5 MG PO TABS
12.5000 mg | ORAL_TABLET | Freq: Two times a day (BID) | ORAL | Status: DC
  Administered 2018-02-27 – 2018-02-28 (×3): 12.5 mg via ORAL
  Filled 2018-02-27 (×3): qty 1

## 2018-02-27 MED ORDER — AMLODIPINE BESYLATE 10 MG PO TABS
10.0000 mg | ORAL_TABLET | Freq: Every day | ORAL | Status: DC
  Administered 2018-02-27 – 2018-02-28 (×2): 10 mg via ORAL
  Filled 2018-02-27 (×2): qty 1

## 2018-02-27 MED ORDER — HYDRALAZINE HCL 20 MG/ML IJ SOLN
10.0000 mg | INTRAMUSCULAR | Status: DC | PRN
  Administered 2018-02-27 – 2018-02-28 (×2): 10 mg via INTRAVENOUS
  Filled 2018-02-27 (×4): qty 1

## 2018-02-27 NOTE — Progress Notes (Signed)
NAME:  David [name], MRN:  [id number], DOB:  Apr 25, 1970, LOS: 1 ADMISSION DATE:  02/26/2018, CONSULTATION DATE:  02/26/18 REFERRING MD:  Shana Chute, CHIEF COMPLAINT:  HA  Brief History   48 year old male with PMH who presented to the ED 2/17 with HA and hematuria, after initially presenting to Urgent Care and noted to have BP 226/149. He was given 1x labetalol and later started on cardene gtt.   Past Medical History  HTN, CKD, tobacco abuse  Significant Hospital Events   2/17 > admitted  Consults:  PCCM  Nephrology  Procedures:    Significant Diagnostic Tests:  2/17 Renal US >>>  1. Diffusely increased echogenicity within the renal parenchyma, compatible with medical renal disease. 2. No hydronephrosis.  Micro Data:  2/17 UCx>>>  2/17 MRSA PCR >> neg  Antimicrobials:   Interim history/subjective:  Almost off cardene, SBP at goal No complaints from patient  Objective   Blood pressure (!) 196/125, pulse 84, temperature 98.4 F (36.9 C), temperature source Oral, resp. rate 20, height 5' 7.32" (1.71 m), weight 95.4 kg, SpO2 98 %.        Intake/Output Summary (Last 24 hours) at 02/27/2018 1244 Last data filed at 02/27/2018 1136 Gross per 24 hour  Intake 450 ml  Output 1000 ml  Net -550 ml   Filed Weights   02/27/18 0306  Weight: 95.4 kg    Examination: General:  AA adult male sitting upright in bed in NAD HEENT: MM pink/moist Neuro: Awake, oriented, MAE, non focal CV:  rrr, no m/r/g PULM: even/non-labored, lungs bilaterally clear, on room air CH:ENID, non-tender, bs active  Extremities: warm/dry, no peripheral edema  Skin: no rashes   Resolved Hospital Problem list     Assessment & Plan:   Hypertensive Crisis  -underlying HTN, non-compliant with home medication  -initial SBP>220, received 1x labetalol then started on cardene gtt - neg trop P tele monitoring continue cardene gtt, with goal of reducing BP by 10-20% in first 24 hours with SBP goal  ~180, on minimal dose currently- will stop after po meds start norvasc 10 mg daily/ coreg 12.5mg  daily per renal  TTE ordered  ongoing education given prior non-compliance with meds  Tobacco cessation counseling  Acute Kidney Injury -possible underlying CKD vs end organ damage associated with hypertensive emergency  -Presenting Cr 4.5 (prior from 2018 1.5, unknown recent baseline however) -hematuria, with RBCs on UA - renal US showing diffuse increased echogenicity, no hydronephrosis  P Nephrology following, appreciate recs  SBP goals as above Pending aldosterone, renin TSH 3.071, cortisol 10 Trend renal function/ UOP Avoid nephrotoxic meds   Electrolyte Abnormality -Hypokalemia P -s/p repletion Trend on BMP  Thrombocytopenia -possibly due to hypertensive emergency  -Some RBC in urine, no known bleeding otherwise  P Follow CBC If worsening, may warrant further investigation or OP workup.  Best practice:  Diet: Renal diet  Pain/Anxiety/Delirium protocol (if indicated): n/a VAP protocol (if indicated): n/a DVT prophylaxis: heparin sq GI prophylaxis: n/a Glucose control: monitor Mobility: assist Code Status: Full Family Communication: none at bedside Disposition: once Cardene gtt is off, can transfer to tele and TRH  Labs   CBC: Recent Labs  Lab 02/26/18 1302 02/27/18 0319  WBC 5.7 4.8  NEUTROABS 3.4  --   HGB 14.1 13.8  HCT 44.3 42.3  MCV 86.0 84.3  PLT 100* 97*    Basic Metabolic Panel: Recent Labs  Lab 02/26/18 1715 02/27/18 0319  NA 134* 135  K  3.2* 3.1*  CL 97* 99  CO2 26 25  GLUCOSE 159* 117*  BUN 35* 31*  CREATININE 4.06* 3.77*  CALCIUM 9.1 9.3   GFR: Estimated Creatinine Clearance: 26.5 mL/min (A) (by C-G formula based on SCr of 3.77 mg/dL (H)). Recent Labs  Lab 02/26/18 1302 02/27/18 0319  WBC 5.7 4.8    Liver Function Tests: Recent Labs  Lab 02/26/18 1715 02/27/18 0319  AST 21 20  ALT 12 13  ALKPHOS 66 70  BILITOT 1.1 1.2   PROT 6.5 7.2  ALBUMIN 3.9 4.0   No results for input(s): LIPASE, AMYLASE in the last 168 hours. No results for input(s): AMMONIA in the last 168 hours.  ABG No results found for: PHART, PCO2ART, PO2ART, HCO3, TCO2, ACIDBASEDEF, O2SAT   Coagulation Profile: Recent Labs  Lab 02/27/18 0319  INR 1.00    Cardiac Enzymes: Recent Labs  Lab 02/26/18 1820  CKTOTAL 141  CKMB 2.4    HbA1C: Hemoglobin A1C  Date/Time Value Ref Range Status  08/15/2016 01:27 PM 5.6  Final    CBG: Recent Labs  Lab 02/27/18 0305  GLUCAP 127*     Critical care time: 30 minutes    Kennieth Rad, MSN, AGACNP-BC [facility] Pgr: [phone] or if no answer [phone] 02/27/2018, 1:13 PM

## 2018-02-27 NOTE — Progress Notes (Signed)
eLink Physician-Brief Progress Note Patient Name: Jermie Hippe DOB: 05/12/1970 MRN: [id number]   Date of Service  02/27/2018  HPI/Events of Note  Hypertension - BP = 182/121.  eICU Interventions  Will order: 1. Hydralazine 10 mg IV Q 4 hours PRN SBP > 170 or DBP > 100.      Intervention Category Major Interventions: Hypertension - evaluation and management  Sommer,Steven Eugene 02/27/2018, 11:35 PM

## 2018-02-27 NOTE — Progress Notes (Addendum)
Pt is very modest, has refused to remove boxers upon admission, and has refused insertion of urinary catheter.

## 2018-02-27 NOTE — ED Provider Notes (Signed)
[facility] PROGRESSIVE CARE Provider Note   CSN: [id number] Arrival date & time: 02/26/18  1230    History   Chief Complaint Chief Complaint  Patient presents with  . Fever  . Hematuria    HPI Braulio Kiedrowski is a 48 y.o. male.     HPI   48 year old male with headache and hematuria.  Onset about 3 days ago.  Persistent since then.  Denies any trauma.  No fever.  Describes his headache is diffuse, perhaps somewhat worse in the occipital region.  No appreciable exacerbating living factors.  No neck pain or neck stiffness.  No acute neurologic complaints otherwise.  He also noticed that his urine has been extremely dark, possibly bloody.  No dysuria.  No abdominal or back pain.  Noted to be extremely hypertensive.  He has been told this previously.  He has been previously prescribed medications but he did not like the idea of taking them.  Past Medical History:  Diagnosis Date  . Hypertension     Patient Active Problem List   Diagnosis Date Noted  . ARF (acute renal failure) ([place])   . Hypertensive emergency 02/26/2018    History reviewed. No pertinent surgical history.      Home Medications    Prior to Admission medications   Medication Sig Start Date End Date Taking? Authorizing Provider  amLODipine (NORVASC) 10 MG tablet Take 1 tablet (10 mg total) by mouth daily. Patient not taking: Reported on 02/26/2018 08/15/16   Scot Jun, FNP  aspirin EC 81 MG tablet Take 1 tablet (81 mg total) by mouth daily. Patient not taking: Reported on 02/26/2018 08/15/16   Scot Jun, FNP  lisinopril (PRINIVIL,ZESTRIL) 10 MG tablet Take 2 tablets (20 mg total) by mouth daily. Patient not taking: Reported on 02/26/2018 08/15/16   Scot Jun, FNP    Family History Family History  Family history unknown: Yes    Social History Social History   Tobacco Use  . Smoking status: Current Every Day Smoker  . Smokeless tobacco: Never Used  Substance Use Topics  . Alcohol  use: No  . Drug use: No     Allergies   Patient has no known allergies.   Review of Systems Review of Systems All systems reviewed and negative, other than as noted in HPI.   Physical Exam Updated Vital Signs BP (!) 190/120 (BP Location: Right Arm)   Pulse 61   Temp 99.1 F (37.3 C) (Oral)   Resp 18   Ht 5' 7.32" (1.71 m)   Wt 95.4 kg   SpO2 100%   BMI 32.63 kg/m   Physical Exam Vitals signs and nursing note reviewed.  Constitutional:      General: He is not in acute distress.    Appearance: He is well-developed.  HENT:     Head: Normocephalic and atraumatic.  Eyes:     General:        Right eye: No discharge.        Left eye: No discharge.     Conjunctiva/sclera: Conjunctivae normal.  Neck:     Musculoskeletal: Neck supple.  Cardiovascular:     Rate and Rhythm: Normal rate and regular rhythm.     Heart sounds: Normal heart sounds. No murmur. No friction rub. No gallop.   Pulmonary:     Effort: Pulmonary effort is normal. No respiratory distress.     Breath sounds: Normal breath sounds.  Abdominal:     General: There is  no distension.     Palpations: Abdomen is soft.     Tenderness: There is no abdominal tenderness.  Musculoskeletal:        General: No tenderness.  Skin:    General: Skin is warm and dry.  Neurological:     General: No focal deficit present.     Mental Status: He is alert.     Sensory: No sensory deficit.     Motor: No weakness.     Coordination: Coordination normal.  Psychiatric:        Behavior: Behavior normal.        Thought Content: Thought content normal.      ED Treatments / Results  Labs (all labs ordered are listed, but only abnormal results are displayed) Labs Reviewed  CBC WITH DIFFERENTIAL/PLATELET - Abnormal; Notable for the following components:      Result Value   Platelets 100 (*)    All other components within normal limits  URINALYSIS, ROUTINE W REFLEX MICROSCOPIC - Abnormal; Notable for the following  components:   Color, Urine RED (*)    APPearance TURBID (*)    Glucose, UA   (*)    Value: TEST NOT REPORTED DUE TO COLOR INTERFERENCE OF URINE PIGMENT   Hgb urine dipstick   (*)    Value: TEST NOT REPORTED DUE TO COLOR INTERFERENCE OF URINE PIGMENT   Bilirubin Urine   (*)    Value: TEST NOT REPORTED DUE TO COLOR INTERFERENCE OF URINE PIGMENT   Ketones, ur   (*)    Value: TEST NOT REPORTED DUE TO COLOR INTERFERENCE OF URINE PIGMENT   Protein, ur   (*)    Value: TEST NOT REPORTED DUE TO COLOR INTERFERENCE OF URINE PIGMENT   Nitrite   (*)    Value: TEST NOT REPORTED DUE TO COLOR INTERFERENCE OF URINE PIGMENT   Leukocytes,Ua   (*)    Value: TEST NOT REPORTED DUE TO COLOR INTERFERENCE OF URINE PIGMENT   All other components within normal limits  COMPREHENSIVE METABOLIC PANEL - Abnormal; Notable for the following components:   Sodium 134 (*)    Potassium 3.2 (*)    Chloride 97 (*)    Glucose, Bld 159 (*)    BUN 35 (*)    Creatinine, Ser 4.06 (*)    GFR calc non Af Amer 16 (*)    GFR calc Af Amer 19 (*)    All other components within normal limits  URINALYSIS, MICROSCOPIC (REFLEX) - Abnormal; Notable for the following components:   Bacteria, UA FEW (*)    All other components within normal limits  COMPREHENSIVE METABOLIC PANEL - Abnormal; Notable for the following components:   Potassium 3.1 (*)    Glucose, Bld 117 (*)    BUN 31 (*)    Creatinine, Ser 3.77 (*)    GFR calc non Af Amer 18 (*)    GFR calc Af Amer 21 (*)    All other components within normal limits  CBC - Abnormal; Notable for the following components:   Platelets 97 (*)    All other components within normal limits  GLUCOSE, CAPILLARY - Abnormal; Notable for the following components:   Glucose-Capillary 127 (*)    All other components within normal limits  URINE CULTURE  MRSA PCR SCREENING  CK TOTAL AND CKMB (NOT AT [facility])  HIV ANTIBODY (ROUTINE TESTING W REFLEX)  PROTIME-INR  TSH  CORTISOL  ALDOSTERONE +  RENIN ACTIVITY W/ RATIO    EKG None  Radiology US Renal  Result Date: 02/27/2018 CLINICAL DATA:  Initial evaluation for acute renal failure EXAM: RENAL / URINARY TRACT ULTRASOUND COMPLETE COMPARISON:  None. FINDINGS: Right Kidney: Renal measurements: 10.0 x 4.4 x 5.5 cm = volume: 126.1 mL. Diffusely increased echogenicity within the renal parenchyma. No mass or hydronephrosis visualized. Left Kidney: Renal measurements: 9.9 x 7.6 x 5.3 cm = volume: 206.9 mL. Diffusely increased echogenicity within the renal parenchyma. No mass or hydronephrosis visualized. Bladder: Appears normal for degree of bladder distention. IMPRESSION: 1. Diffusely increased echogenicity within the renal parenchyma, compatible with medical renal disease. 2. No hydronephrosis. Electronically Signed   By: Jeannine Boga M.D.   On: 02/27/2018 04:58    Procedures Procedures (including critical care time)  Medications Ordered in ED Medications  heparin injection 5,000 Units (5,000 Units Subcutaneous Given 02/27/18 1349)  sodium chloride flush (NS) 0.9 % injection 3 mL (3 mLs Intravenous Not Given 02/27/18 0853)  amLODipine (NORVASC) tablet 10 mg (10 mg Oral Given 02/27/18 1348)  carvedilol (COREG) tablet 12.5 mg (12.5 mg Oral Given 02/27/18 1348)  sodium chloride flush (NS) 0.9 % injection 3 mL (3 mLs Intravenous Given 02/26/18 1754)  sodium chloride 0.9 % bolus 500 mL (0 mLs Intravenous Stopped 02/26/18 1846)  HYDROmorphone (DILAUDID) injection 0.5 mg (0.5 mg Intravenous Given 02/26/18 1754)  ketorolac (TORADOL) 15 MG/ML injection 10 mg (10 mg Intravenous Given 02/26/18 1753)  labetalol (NORMODYNE,TRANDATE) injection 20 mg (20 mg Intravenous Given 02/26/18 1846)  potassium chloride (KLOR-CON) packet 20 mEq (20 mEq Oral Given 02/26/18 2351)     Initial Impression / Assessment and Plan / ED Course  I have reviewed the triage vital signs and the nursing notes.  Pertinent labs & imaging results that were available during  my care of the patient were reviewed by me and considered in my medical decision making (see chart for details).        48 year old male with headache and hematuria.  Significant renal impairment.  Little labs for comparison and none in over a year.  Change from previous although I suspect that chronic release to some degree given his persistent market hypertension.  UA too dark to do much analysis.  Headache with a nonfocal neuro exam.  Unsure if related to his market hypertension or not.  Will admit for ongoing blood pressure management and further work-up.  Discussed with hospitalist service for admission. Final Clinical Impressions(s) / ED Diagnoses   Final diagnoses:  Accelerated hypertension  Kidney disease    ED Discharge Orders    None       Virgel Manifold, MD 02/27/18 [phone]

## 2018-02-27 NOTE — Progress Notes (Signed)
Pt probably moved.

## 2018-02-27 NOTE — Progress Notes (Signed)
Admit: 02/26/2018 LOS: 1  62M presenting with acute on chronic renal failure vs progressive CKD, hypertensive emergency secondary to medication nonadherence, gross hematuria  Subjective:  . In ICU, on nicardipine, blood pressures have improved now systolic 606Y to 045T / diastolic 977S to 142L . Serum creatinine improved from 4.06-3.77, potassium 3.1 with serum bicarbonate 25.  Platelet count stable at 97.  No anemia. . Renal ultrasound with normal-sized kidneys bilaterally, diffusely increased echogenicity bilaterally.  No hydronephrosis.  No mass identified bilaterally. Marland Kitchen 0.5 L urine output since admission  02/17 0701 - 02/18 0700 In: 250 [I.V.:250] Out: -   Filed Weights   02/27/18 0306  Weight: 95.4 kg    Scheduled Meds: . heparin  5,000 Units Subcutaneous Q8H  . sodium chloride flush  3 mL Intravenous Q12H   Continuous Infusions: . niCARDipine 5 mg/hr (02/27/18 0600)   PRN Meds:.  Current Labs: reviewed  Aldo and renin levels from 2/18 pending  Physical Exam:  Blood pressure (!) 168/120, pulse 73, temperature (!) 97.4 F (36.3 C), temperature source Oral, resp. rate 16, height 5' 7.32" (1.71 m), weight 95.4 kg, SpO2 96 %. NAD, well-appearing, well-developed RRR, normal S1 and S2 Clear bilaterally, normal work of breathing No peripheral edema No abdominal bruits, soft, nontender No rashes or lesions Nonfocal neurological exam  A 1. 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 Hypertensive emergency on nicardipine drip 3. Medication nonadherence 4. Gross hematuria, renal ultrasound negative 5. Tobacco user 6. TCP likely related to #1 7. Hypokalemia, etiology unclear, renin and aldosterone levels pending.  P . Start oral regimen, carvedilol 12.5 mg twice daily, amlodipine 10 mg daily; wean off nicardipine if SBP less than 180 . Renin and aldosterone levels pending, K repleted . We will continue to  follow . Medication Issues; o Preferred narcotic agents for pain control are hydromorphone, fentanyl, and methadone. Morphine should not be used.  o Baclofen should be avoided o Avoid oral sodium phosphate and magnesium citrate based laxatives / bowel preps    Pearson Grippe MD 02/27/2018, 10:10 AM  Recent Labs  Lab 02/26/18 1715 02/27/18 0319  NA 134* 135  K 3.2* 3.1*  CL 97* 99  CO2 26 25  GLUCOSE 159* 117*  BUN 35* 31*  CREATININE 4.06* 3.77*  CALCIUM 9.1 9.3   Recent Labs  Lab 02/26/18 1302 02/27/18 0319  WBC 5.7 4.8  NEUTROABS 3.4  --   HGB 14.1 13.8  HCT 44.3 42.3  MCV 86.0 84.3  PLT 100* 97*

## 2018-02-27 NOTE — Progress Notes (Signed)
David [name] [id number] Admission Data: 02/27/2018 8:07 PM Attending Provider: Juanito Doom, MD  [id number], Carroll Sage, FNP Consults/ Treatment Team: Treatment Team:  Donato Heinz, MD  David [name] is a 48 y.o. male patient admitted from ED awake, alert  & orientated  X 3,  Full Code, VSS - Blood pressure (!) 190/120, pulse 61, temperature 99.1 F (37.3 C), temperature source Oral, resp. rate 18, height 5' 7.32" (1.71 m), weight 95.4 kg, SpO2 100 %., O2     RA nasal cannular, no c/o shortness of breath, no c/o chest pain, no distress noted. Tele # [phone] placed and pt is currently running:normal sinus rhythm.   IV site WDL:  antecubital right, condition patent and no redness with a transparent dsg that's clean dry and intact.  Allergies:  No Known Allergies   Past Medical History:  Diagnosis Date  . Hypertension       Pt orientation to unit, room and routine. Information packet given to patient/family and safety video watched.  Admission INP armband ID verified with patient/family, and in place. SR up x 2, fall risk assessment complete with Patient and family verbalizing understanding of risks associated with falls. Pt verbalizes an understanding of how to use the call bell and to call for help before getting out of bed.  Skin, clean-dry- intact without evidence of bruising, or skin tears.   No evidence of skin break down noted on exam. no rashes, no ecchymoses, no petechiae, no nodules, no jaundice, no purpura, no wounds    Will cont to monitor and assist as needed.  Tresa Endo, RN 02/27/2018 8:07 PM

## 2018-02-27 NOTE — Progress Notes (Signed)
  Echocardiogram 2D Echocardiogram has been performed.  David [name] 02/27/2018, 2:41 PM

## 2018-02-27 NOTE — Progress Notes (Signed)
eLink Physician-Brief Progress Note Patient Name: David [name] DOB: August 27, 1970 MRN: [id number]   Date of Service  02/27/2018  HPI/Events of Note  K+ = 3.1 and Creatinine = 3.77.  eICU Interventions  Will not replace K+ d/t severe renal dysfunction.      Intervention Category Major Interventions: Electrolyte abnormality - evaluation and management  [name] 02/27/2018, 6:17 AM

## 2018-02-27 NOTE — Progress Notes (Signed)
PIV consult: Discussed with pt's nurse. Currently has one IV med ordered. Nira Conn, RN will re-enter consult if additional IV is needed.

## 2018-02-27 NOTE — Plan of Care (Signed)
  Problem: Education: Goal: Knowledge of General Education information will improve Description Including pain rating scale, medication(s)/side effects and non-pharmacologic comfort measures Outcome: Progressing   

## 2018-02-28 ENCOUNTER — Other Ambulatory Visit: Payer: Self-pay

## 2018-02-28 DIAGNOSIS — F17200 Nicotine dependence, unspecified, uncomplicated: Secondary | ICD-10-CM | POA: Diagnosis present

## 2018-02-28 DIAGNOSIS — R319 Hematuria, unspecified: Secondary | ICD-10-CM | POA: Diagnosis present

## 2018-02-28 LAB — RENAL FUNCTION PANEL
Albumin: 4 g/dL (ref 3.5–5.0)
Anion gap: 10 (ref 5–15)
BUN: 32 mg/dL — ABNORMAL HIGH (ref 6–20)
CHLORIDE: 102 mmol/L (ref 98–111)
CO2: 23 mmol/L (ref 22–32)
Calcium: 9.3 mg/dL (ref 8.9–10.3)
Creatinine, Ser: 3.83 mg/dL — ABNORMAL HIGH (ref 0.61–1.24)
GFR calc Af Amer: 20 mL/min — ABNORMAL LOW (ref >60)
GFR calc non Af Amer: 17 mL/min — ABNORMAL LOW (ref >60)
Glucose, Bld: 112 mg/dL — ABNORMAL HIGH (ref 70–99)
POTASSIUM: 3.8 mmol/L (ref 3.5–5.1)
Phosphorus: 3.5 mg/dL (ref 2.5–4.6)
Sodium: 135 mmol/L (ref 135–145)

## 2018-02-28 MED ORDER — HYDRALAZINE HCL 50 MG PO TABS
100.0000 mg | ORAL_TABLET | Freq: Three times a day (TID) | ORAL | Status: DC
  Administered 2018-02-28 (×3): 100 mg via ORAL
  Filled 2018-02-28 (×4): qty 2

## 2018-02-28 MED ORDER — POTASSIUM CHLORIDE CRYS ER 20 MEQ PO TBCR
40.0000 meq | EXTENDED_RELEASE_TABLET | Freq: Once | ORAL | Status: AC
  Administered 2018-02-28: 40 meq via ORAL
  Filled 2018-02-28: qty 2

## 2018-02-28 MED ORDER — ISOSORBIDE MONONITRATE ER 30 MG PO TB24
30.0000 mg | ORAL_TABLET | Freq: Every day | ORAL | Status: DC
  Administered 2018-02-28 – 2018-03-02 (×3): 30 mg via ORAL
  Filled 2018-02-28 (×3): qty 1

## 2018-02-28 MED ORDER — ACETAMINOPHEN 325 MG PO TABS
650.0000 mg | ORAL_TABLET | Freq: Four times a day (QID) | ORAL | Status: DC | PRN
  Administered 2018-03-01: 650 mg via ORAL
  Filled 2018-02-28 (×2): qty 2

## 2018-02-28 NOTE — Care Management Note (Signed)
Case Management Note  Patient Details  Name: Tomie Elko MRN: [id number] Date of Birth: 1970/02/08  Subjective/Objective:   HTN emergency. Resides with wife. Independent with ADL's , no DME usage.        PCP: Molli Barrows  Action/Plan: Transition to home when medically stable.  Hospital f/u: [facility]     [address] [place] [address]    Next Steps: Go on 03/22/2018    Instructions: 9:30 am, Molli Barrows NP     Pt with transportation to home.  Expected Discharge Date:  03/01/18               Expected Discharge Plan:  Home/Self Care  In-House Referral:  NA  Discharge planning Services  CM Consult  Post Acute Care Choice:  NA Choice offered to:  NA  DME Arranged:  N/A DME Agency:  NA  HH Arranged:  NA HH Agency:  NA  Status of Service:  Completed, signed off  If discussed at [place] of Stay Meetings, dates discussed:    Additional Comments:  Sharin Mons, RN 02/28/2018, 10:48 AM

## 2018-02-28 NOTE — Progress Notes (Signed)
PROGRESS NOTE                                                                                                                                                                                                             Patient Demographics:    David [name], is a 48 y.o. male, DOB - 10-Dec-1970, OZD:[id number]  Admit date - 02/26/2018   Admitting Physician Aldean Jewett, MD  Outpatient Primary MD for the patient is Scot Jun, FNP  LOS - 2  Chief Complaint  Patient presents with  . Fever  . Hematuria       Brief Narrative  This is a 48 year old man with medical problems including current everyday smoker and hypertension presenting with gross hematuria and headaches.  History is obtained via chart review as well as patient and family report in addition to talking to the emergency medicine team.  The patient reports his native country as being the [place] in [place].  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    In the ER he was diagnosed with renal failure, hematuria and hypertensive crisis.   Subjective:    [name] today has, No headache, No chest pain, No abdominal pain - No Nausea, No new weakness tingling or numbness, No Cough - SOB.     Assessment  & Plan :     1.  Hypertensive crisis.  Blood pressure still poorly controlled, have added 100 of hydralazine 3 times a day, Imdur 30 daily, continue Norvasc and Coreg at present dose and monitor.  2.  Renal failure.  Likely ARF on CKD.  Baseline renal function unknown however ultrasound does suggest chronic medical disease stage unknown, nephrology following.  For dialysis.  3.  HematuriaI.  Renal ultrasound stable.  Defer further management to nephrology.  Could have Nephritic syndrome.  4. Smoker counseled to quit.    Family Communication  :  None  Code Status :  Full  Disposition Plan  :  TBD  Consults  :  Renal, PCCM  Procedures  :    Renal US -  Chr. medical kidney disease.  DVT Prophylaxis  :    Heparin    Lab Results  Component Value Date   PLT 97 (L) 02/27/2018    Diet :  Diet Order            Diet renal/carb modified with fluid restriction Diet-HS Snack? Nothing; Fluid restriction: 1200  mL Fluid; Room service appropriate? Yes; Fluid consistency: Thin  Diet effective now               Inpatient Medications Scheduled Meds: . amLODipine  10 mg Oral Daily  . carvedilol  12.5 mg Oral BID WC  . heparin  5,000 Units Subcutaneous Q8H  . hydrALAZINE  100 mg Oral Q8H  . isosorbide mononitrate  30 mg Oral Daily  . sodium chloride flush  3 mL Intravenous Q12H   Continuous Infusions: PRN Meds:.hydrALAZINE  Antibiotics  :   Anti-infectives (From admission, onward)   None          Objective:   Vitals:   02/28/18 0523 02/28/18 0743 02/28/18 1118 02/28/18 1120  BP: (!) 204/119 (!) 204/117 (!) 182/121 (!) 162/117  Pulse: 66 65 68 70  Resp: 18     Temp: 98.1 F (36.7 C)     TempSrc: Oral     SpO2: 100%     Weight: 94.1 kg     Height:        Wt Readings from Last 3 Encounters:  02/28/18 94.1 kg  08/15/16 102.5 kg  08/06/16 136.1 kg     Intake/Output Summary (Last 24 hours) at 02/28/2018 1134 Last data filed at 02/27/2018 2100 Gross per 24 hour  Intake 240 ml  Output 500 ml  Net -260 ml     Physical Exam  Awake Alert, Oriented X 3, No new F.N deficits, Normal affect [place].AT,PERRAL Supple Neck,No JVD, No cervical lymphadenopathy appriciated.  Symmetrical Chest wall movement, Good air movement bilaterally, CTAB RRR,No Gallops,Rubs or new Murmurs, No Parasternal Heave +ve B.Sounds, Abd Soft, No tenderness, No organomegaly appriciated, No rebound - guarding or rigidity. No Cyanosis, Clubbing or edema, No new Rash or bruise       Data Review:    CBC Recent Labs  Lab 02/26/18 1302 02/27/18 0319  WBC 5.7 4.8  HGB 14.1 13.8  HCT 44.3 42.3  PLT 100* 97*  MCV 86.0 84.3  MCH 27.4 27.5  MCHC 31.8  32.6  RDW 15.1 15.0  LYMPHSABS 1.5  --   MONOABS 0.8  --   EOSABS 0.1  --   BASOSABS 0.0  --     Chemistries  Recent Labs  Lab 02/26/18 1715 02/27/18 0319  NA 134* 135  K 3.2* 3.1*  CL 97* 99  CO2 26 25  GLUCOSE 159* 117*  BUN 35* 31*  CREATININE 4.06* 3.77*  CALCIUM 9.1 9.3  AST 21 20  ALT 12 13  ALKPHOS 66 70  BILITOT 1.1 1.2   ------------------------------------------------------------------------------------------------------------------ No results for input(s): CHOL, HDL, LDLCALC, TRIG, CHOLHDL, LDLDIRECT in the last 72 hours.  Lab Results  Component Value Date   HGBA1C 5.6 08/15/2016   ------------------------------------------------------------------------------------------------------------------ Recent Labs    02/27/18 0021  TSH 3.071   ------------------------------------------------------------------------------------------------------------------ No results for input(s): VITAMINB12, FOLATE, FERRITIN, TIBC, IRON, RETICCTPCT in the last 72 hours.  Coagulation profile Recent Labs  Lab 02/27/18 0319  INR 1.00    No results for input(s): DDIMER in the last 72 hours.  Cardiac Enzymes Recent Labs  Lab 02/26/18 1820  CKMB 2.4   ------------------------------------------------------------------------------------------------------------------ No results found for: BNP  Micro Results Recent Results (from the past 240 hour(s))  Urine culture     Status: None   Collection Time: 02/26/18  5:57 PM  Result Value Ref Range Status   Specimen Description URINE, RANDOM  Final   Special Requests NONE  Final   Culture  Final    NO GROWTH Performed at [facility], [place] [address], [place], [place] [address]    Report Status 02/27/2018 FINAL  Final  MRSA PCR Screening     Status: None   Collection Time: 02/27/18  3:37 AM  Result Value Ref Range Status   MRSA by PCR NEGATIVE NEGATIVE Final    Comment:        The GeneXpert MRSA Assay  (FDA approved for NASAL specimens only), is one component of a comprehensive MRSA colonization surveillance program. It is not intended to diagnose MRSA infection nor to guide or monitor treatment for MRSA infections. Performed at [facility], [place] [address] [place], [place] [address]     Radiology Reports US Renal  Result Date: 02/27/2018 CLINICAL DATA:  Initial evaluation for acute renal failure EXAM: RENAL / URINARY TRACT ULTRASOUND COMPLETE COMPARISON:  None. FINDINGS: Right Kidney: Renal measurements: 10.0 x 4.4 x 5.5 cm = volume: 126.1 mL. Diffusely increased echogenicity within the renal parenchyma. No mass or hydronephrosis visualized. Left Kidney: Renal measurements: 9.9 x 7.6 x 5.3 cm = volume: 206.9 mL. Diffusely increased echogenicity within the renal parenchyma. No mass or hydronephrosis visualized. Bladder: Appears normal for degree of bladder distention. IMPRESSION: 1. Diffusely increased echogenicity within the renal parenchyma, compatible with medical renal disease. 2. No hydronephrosis. Electronically Signed   By: Jeannine Boga M.D.   On: 02/27/2018 04:58    Time Spent in minutes  30   Lala Lund M.D on 02/28/2018 at 11:34 AM  To page go to [email or web address] - password [facility]

## 2018-02-28 NOTE — Progress Notes (Signed)
Admit: 02/26/2018 LOS: 2  40M presenting with acute on chronic renal failure vs progressive CKD, hypertensive emergency secondary to medication nonadherence, gross hematuria  Subjective:  Marland Kitchen Blood pressures rebounded after transfer out of ICU, hydralazine added . Serum creatinine 3.8 today with potassium 3.8 and bicarbonate 23. Marland Kitchen Renin and aldosterone levels not resulted . Adequate urine output  02/18 0701 - 02/19 0700 In: 440 [P.O.:240; I.V.:200] Out: 1000 [Urine:1000]  Filed Weights   02/27/18 0306 02/28/18 0523  Weight: 95.4 kg 94.1 kg    Scheduled Meds: . amLODipine  10 mg Oral Daily  . carvedilol  12.5 mg Oral BID WC  . heparin  5,000 Units Subcutaneous Q8H  . hydrALAZINE  100 mg Oral Q8H  . isosorbide mononitrate  30 mg Oral Daily  . sodium chloride flush  3 mL Intravenous Q12H   Continuous Infusions:  PRN Meds:.  Current Labs: reviewed  Aldo and renin levels from 2/18 pending  Physical Exam:  Blood pressure (!) 162/117, pulse 70, temperature 98.1 F (36.7 C), temperature source Oral, resp. rate 18, height 5' 7.32" (1.71 m), weight 94.1 kg, SpO2 100 %. NAD, well-appearing, well-developed RRR, normal S1 and S2 Clear bilaterally, normal work of breathing No peripheral edema No abdominal bruits, soft, nontender No rashes or lesions Nonfocal neurological exam  A 1. 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 Hypertensive emergency off nicardipine; uptitrating oral medications; not yet controlled 3. Medication nonadherence 4. Gross hematuria, renal ultrasound negative; will need urology follow-up as outpatient especially given #5 5. Tobacco user 6. TCP likely related to #1 7. Hypokalemia, etiology unclear, improved.  Renin and aldosterone levels pending.  P . Agree with increased oral regimen, follow along to see effect . Renin and aldosterone levels pending, K repleted . We will continue to follow . Will need  nephrology follow-up at discharge . Medication Issues; o Preferred narcotic agents for pain control are hydromorphone, fentanyl, and methadone. Morphine should not be used.  o Baclofen should be avoided o Avoid oral sodium phosphate and magnesium citrate based laxatives / bowel preps    Pearson Grippe MD 02/28/2018, 1:43 PM  Recent Labs  Lab 02/26/18 1715 02/27/18 0319 02/28/18 1134  NA 134* 135 135  K 3.2* 3.1* 3.8  CL 97* 99 102  CO2 26 25 23   GLUCOSE 159* 117* 112*  BUN 35* 31* 32*  CREATININE 4.06* 3.77* 3.83*  CALCIUM 9.1 9.3 9.3  PHOS  --   --  3.5   Recent Labs  Lab 02/26/18 1302 02/27/18 0319  WBC 5.7 4.8  NEUTROABS 3.4  --   HGB 14.1 13.8  HCT 44.3 42.3  MCV 86.0 84.3  PLT 100* 97*

## 2018-02-28 NOTE — Plan of Care (Signed)

## 2018-03-01 LAB — BASIC METABOLIC PANEL
Anion gap: 12 (ref 5–15)
BUN: 35 mg/dL — ABNORMAL HIGH (ref 6–20)
CO2: 23 mmol/L (ref 22–32)
Calcium: 9.3 mg/dL (ref 8.9–10.3)
Chloride: 101 mmol/L (ref 98–111)
Creatinine, Ser: 4.38 mg/dL — ABNORMAL HIGH (ref 0.61–1.24)
GFR calc Af Amer: 17 mL/min — ABNORMAL LOW (ref >60)
GFR calc non Af Amer: 15 mL/min — ABNORMAL LOW (ref >60)
Glucose, Bld: 110 mg/dL — ABNORMAL HIGH (ref 70–99)
Potassium: 3.6 mmol/L (ref 3.5–5.1)
Sodium: 136 mmol/L (ref 135–145)

## 2018-03-01 LAB — CBC
HCT: 41.2 % (ref 39.0–52.0)
HEMOGLOBIN: 13.2 g/dL (ref 13.0–17.0)
MCH: 27 pg (ref 26.0–34.0)
MCHC: 32 g/dL (ref 30.0–36.0)
MCV: 84.4 fL (ref 80.0–100.0)
Platelets: 127 10*3/uL — ABNORMAL LOW (ref 150–400)
RBC: 4.88 MIL/uL (ref 4.22–5.81)
RDW: 15.1 % (ref 11.5–15.5)
WBC: 5.3 10*3/uL (ref 4.0–10.5)
nRBC: 0 % (ref 0.0–0.2)

## 2018-03-01 MED ORDER — HYDRALAZINE HCL 50 MG PO TABS
100.0000 mg | ORAL_TABLET | Freq: Three times a day (TID) | ORAL | Status: DC
  Administered 2018-03-02 – 2018-03-03 (×3): 100 mg via ORAL
  Filled 2018-03-01 (×6): qty 2

## 2018-03-01 MED ORDER — CLONIDINE HCL 0.2 MG PO TABS
0.2000 mg | ORAL_TABLET | Freq: Three times a day (TID) | ORAL | Status: DC
  Administered 2018-03-01 – 2018-03-02 (×4): 0.2 mg via ORAL
  Filled 2018-03-01 (×5): qty 1

## 2018-03-01 MED ORDER — LABETALOL HCL 5 MG/ML IV SOLN
5.0000 mg | INTRAVENOUS | Status: AC | PRN
  Administered 2018-03-01 – 2018-03-02 (×2): 5 mg via INTRAVENOUS
  Filled 2018-03-01 (×2): qty 4

## 2018-03-01 MED ORDER — AMLODIPINE BESYLATE 10 MG PO TABS
10.0000 mg | ORAL_TABLET | Freq: Every day | ORAL | Status: DC
  Administered 2018-03-01 – 2018-03-03 (×3): 10 mg via ORAL
  Filled 2018-03-01 (×3): qty 1

## 2018-03-01 MED ORDER — CARVEDILOL 12.5 MG PO TABS
12.5000 mg | ORAL_TABLET | Freq: Two times a day (BID) | ORAL | Status: DC
  Administered 2018-03-01 – 2018-03-03 (×3): 12.5 mg via ORAL
  Filled 2018-03-01 (×4): qty 1

## 2018-03-01 NOTE — Progress Notes (Signed)
PROGRESS NOTE                                                                                                                                                                                                             Patient Demographics:    David [name], is a 48 y.o. male, DOB - 1970/10/15, XJO:[id number]  Admit date - 02/26/2018   Admitting Physician Aldean Jewett, MD  Outpatient Primary MD for the patient is Scot Jun, FNP  LOS - 3  Chief Complaint  Patient presents with  . Fever  . Hematuria       Brief Narrative  This is a 48 year old man with medical problems including current everyday smoker and hypertension presenting with gross hematuria and headaches.  History is obtained via chart review as well as patient and family report in addition to talking to the emergency medicine team.  The patient reports his [place] as [place].  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    In the ER he was diagnosed with renal failure, hematuria and hypertensive crisis.   Subjective:   Patient in bed, appears comfortable, denies any headache, no fever, no chest pain or pressure, no shortness of breath , no abdominal pain. No focal weakness.    Assessment  & Plan :     1.  Hypertensive crisis.  Pressure still poorly controlled despite being on high doses of Coreg, Norvasc, hydralazine, Imdur, have added moderate dose Catapres 3 times daily on 03/01/2018, continue to monitor.  2.  Renal failure.  Likely ARF on CKD.  Baseline renal function unknown however ultrasound does suggest chronic medical disease stage unknown, nephrology following.  Currently no need for dialysis.  3.  HematuriaI.  Renal ultrasound stable.  Defer further management to nephrology.  Could have Nephritic syndrome.  4. Smoker counseled to quit.    Family Communication  :  None  Code Status :  Full  Disposition Plan  :   TBD  Consults  :  Renal, PCCM  Procedures  :    Renal US - Chr. medical kidney disease.  DVT Prophylaxis  :    Heparin    Lab Results  Component Value Date   PLT 127 (L) 03/01/2018    Diet :  Diet Order            Diet renal/carb modified with fluid restriction Diet-HS Snack? Nothing;  Fluid restriction: 1200 mL Fluid; Room service appropriate? Yes; Fluid consistency: Thin  Diet effective now               Inpatient Medications Scheduled Meds: . amLODipine  10 mg Oral Daily  . carvedilol  12.5 mg Oral BID WC  . cloNIDine  0.2 mg Oral TID  . heparin  5,000 Units Subcutaneous Q8H  . hydrALAZINE  100 mg Oral Q8H  . isosorbide mononitrate  30 mg Oral Daily  . sodium chloride flush  3 mL Intravenous Q12H   Continuous Infusions: PRN Meds:.acetaminophen, hydrALAZINE, labetalol  Antibiotics  :   Anti-infectives (From admission, onward)   None          Objective:   Vitals:   02/28/18 2145 02/28/18 2146 03/01/18 0457 03/01/18 0820  BP: (!) 168/115 (!) 177/106 (!) 189/119 (!) 195/30  Pulse: 73 72 92 72  Resp: 18     Temp: 98.3 F (36.8 C)  98.3 F (36.8 C)   TempSrc: Oral  Oral   SpO2: 98% 96% 100%   Weight:      Height:        Wt Readings from Last 3 Encounters:  02/28/18 94.1 kg  08/15/16 102.5 kg  08/06/16 136.1 kg     Intake/Output Summary (Last 24 hours) at 03/01/2018 0919 Last data filed at 02/28/2018 1300 Gross per 24 hour  Intake 69.58 ml  Output -  Net 69.58 ml     Physical Exam  Awake Alert, Oriented X 3, No new F.N deficits, Normal affect [place].AT,PERRAL Supple Neck,No JVD, No cervical lymphadenopathy appriciated.  Symmetrical Chest wall movement, Good air movement bilaterally, CTAB RRR,No Gallops, Rubs or new Murmurs, No Parasternal Heave +ve B.Sounds, Abd Soft, No tenderness, No organomegaly appriciated, No rebound - guarding or rigidity. No Cyanosis, Clubbing or edema, No new Rash or bruise    Data Review:    CBC Recent Labs    Lab 02/26/18 1302 02/27/18 0319 03/01/18 0454  WBC 5.7 4.8 5.3  HGB 14.1 13.8 13.2  HCT 44.3 42.3 41.2  PLT 100* 97* 127*  MCV 86.0 84.3 84.4  MCH 27.4 27.5 27.0  MCHC 31.8 32.6 32.0  RDW 15.1 15.0 15.1  LYMPHSABS 1.5  --   --   MONOABS 0.8  --   --   EOSABS 0.1  --   --   BASOSABS 0.0  --   --     Chemistries  Recent Labs  Lab 02/26/18 1715 02/27/18 0319 02/28/18 1134  NA 134* 135 135  K 3.2* 3.1* 3.8  CL 97* 99 102  CO2 26 25 23   GLUCOSE 159* 117* 112*  BUN 35* 31* 32*  CREATININE 4.06* 3.77* 3.83*  CALCIUM 9.1 9.3 9.3  AST 21 20  --   ALT 12 13  --   ALKPHOS 66 70  --   BILITOT 1.1 1.2  --    ------------------------------------------------------------------------------------------------------------------ No results for input(s): CHOL, HDL, LDLCALC, TRIG, CHOLHDL, LDLDIRECT in the last 72 hours.  Lab Results  Component Value Date   HGBA1C 5.6 08/15/2016   ------------------------------------------------------------------------------------------------------------------ Recent Labs    02/27/18 0021  TSH 3.071   ------------------------------------------------------------------------------------------------------------------ No results for input(s): VITAMINB12, FOLATE, FERRITIN, TIBC, IRON, RETICCTPCT in the last 72 hours.  Coagulation profile Recent Labs  Lab 02/27/18 0319  INR 1.00    No results for input(s): DDIMER in the last 72 hours.  Cardiac Enzymes Recent Labs  Lab 02/26/18 1820  CKMB 2.4   ------------------------------------------------------------------------------------------------------------------  No results found for: BNP  Micro Results Recent Results (from the past 240 hour(s))  Urine culture     Status: None   Collection Time: 02/26/18  5:57 PM  Result Value Ref Range Status   Specimen Description URINE, RANDOM  Final   Special Requests NONE  Final   Culture   Final    NO GROWTH Performed at [facility],  [address], [place], [place] [address]    Report Status 02/27/2018 FINAL  Final  MRSA PCR Screening     Status: None   Collection Time: 02/27/18  3:37 AM  Result Value Ref Range Status   MRSA by PCR NEGATIVE NEGATIVE Final    Comment:        The GeneXpert MRSA Assay (FDA approved for NASAL specimens only), is one component of a comprehensive MRSA colonization surveillance program. It is not intended to diagnose MRSA infection nor to guide or monitor treatment for MRSA infections. Performed at [facility], [place] [address], [place], [place] [address]     Radiology Reports US Renal  Result Date: 02/27/2018 CLINICAL DATA:  Initial evaluation for acute renal failure EXAM: RENAL / URINARY TRACT ULTRASOUND COMPLETE COMPARISON:  None. FINDINGS: Right Kidney: Renal measurements: 10.0 x 4.4 x 5.5 cm = volume: 126.1 mL. Diffusely increased echogenicity within the renal parenchyma. No mass or hydronephrosis visualized. Left Kidney: Renal measurements: 9.9 x 7.6 x 5.3 cm = volume: 206.9 mL. Diffusely increased echogenicity within the renal parenchyma. No mass or hydronephrosis visualized. Bladder: Appears normal for degree of bladder distention. IMPRESSION: 1. Diffusely increased echogenicity within the renal parenchyma, compatible with medical renal disease. 2. No hydronephrosis. Electronically Signed   By: Jeannine Boga M.D.   On: 02/27/2018 04:58    Time Spent in minutes  30   Lala Lund M.D on 03/01/2018 at 9:19 AM  To page go to [email or web address] - password [facility]

## 2018-03-01 NOTE — Plan of Care (Signed)

## 2018-03-01 NOTE — Plan of Care (Signed)
Discussed with patient plan of care for the evening, pain management and taking bedtime medications between 2130 and 2200 as his preference with some teach back displayed.

## 2018-03-01 NOTE — Progress Notes (Signed)
Admit: 02/26/2018 LOS: 3  46M presenting with acute on chronic renal failure vs progressive CKD, hypertensive emergency secondary to medication nonadherence, gross hematuria  Subjective:  Marland Kitchen Blood pressures remain elevated, though improved.  Clonidine added this morning . Renal function with creatinine 4.38, potassium 3.6, BUN 35 . Patient without complaints, denies headache, vision changes, chest pain, shortness of breath, back pain, abdominal pain . Renin and aldosterone levels not resulted  02/19 0701 - 02/20 0700 In: 69.6 [I.V.:69.6] Out: -   Filed Weights   02/27/18 0306 02/28/18 0523  Weight: 95.4 kg 94.1 kg    Scheduled Meds: . amLODipine  10 mg Oral Daily  . carvedilol  12.5 mg Oral BID WC  . cloNIDine  0.2 mg Oral TID  . heparin  5,000 Units Subcutaneous Q8H  . hydrALAZINE  100 mg Oral Q8H  . isosorbide mononitrate  30 mg Oral Daily  . sodium chloride flush  3 mL Intravenous Q12H   Continuous Infusions:  PRN Meds:.  Current Labs: reviewed  Aldo and renin levels from 2/18 pending  Physical Exam:  Blood pressure (!) 195/30, pulse 72, temperature 98.3 F (36.8 C), temperature source Oral, resp. rate 18, height 5' 7.32" (1.71 m), weight 94.1 kg, SpO2 100 %. NAD, well-appearing, well-developed RRR, normal S1 and S2 Clear bilaterally, normal work of breathing No peripheral edema No abdominal bruits, soft, nontender No rashes or lesions Nonfocal neurological exam  A 1. 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 Hypertensive emergency off nicardipine; uptitrating oral medications; not yet controlled  3. Medication nonadherence 4. Gross hematuria, renal ultrasound negative; will need urology follow-up as outpatient especially given #5 5. Tobacco user 6. TCP likely related to #1 7. Hypokalemia, etiology unclear, improved.  Renin and aldosterone levels pending.  P . Agree with increased oral regimen, follow along to  see effect . Renin and aldosterone levels pending, K repleted . We will continue to follow . Will need nephrology follow-up at discharge . Medication Issues; o Preferred narcotic agents for pain control are hydromorphone, fentanyl, and methadone. Morphine should not be used.  o Baclofen should be avoided o Avoid oral sodium phosphate and magnesium citrate based laxatives / bowel preps    Pearson Grippe MD 03/01/2018, 11:19 AM  Recent Labs  Lab 02/27/18 0319 02/28/18 1134 03/01/18 0454  NA 135 135 136  K 3.1* 3.8 3.6  CL 99 102 101  CO2 25 23 23   GLUCOSE 117* 112* 110*  BUN 31* 32* 35*  CREATININE 3.77* 3.83* 4.38*  CALCIUM 9.3 9.3 9.3  PHOS  --  3.5  --    Recent Labs  Lab 02/26/18 1302 02/27/18 0319 03/01/18 0454  WBC 5.7 4.8 5.3  NEUTROABS 3.4  --   --   HGB 14.1 13.8 13.2  HCT 44.3 42.3 41.2  MCV 86.0 84.3 84.4  PLT 100* 97* 127*

## 2018-03-02 LAB — BASIC METABOLIC PANEL
Anion gap: 10 (ref 5–15)
BUN: 37 mg/dL — ABNORMAL HIGH (ref 6–20)
CO2: 27 mmol/L (ref 22–32)
Calcium: 9.1 mg/dL (ref 8.9–10.3)
Chloride: 98 mmol/L (ref 98–111)
Creatinine, Ser: 4.62 mg/dL — ABNORMAL HIGH (ref 0.61–1.24)
GFR calc non Af Amer: 14 mL/min — ABNORMAL LOW (ref >60)
GFR, EST AFRICAN AMERICAN: 16 mL/min — AB (ref >60)
Glucose, Bld: 106 mg/dL — ABNORMAL HIGH (ref 70–99)
Potassium: 3.4 mmol/L — ABNORMAL LOW (ref 3.5–5.1)
Sodium: 135 mmol/L (ref 135–145)

## 2018-03-02 MED ORDER — CLONIDINE HCL 0.2 MG PO TABS
0.3000 mg | ORAL_TABLET | Freq: Three times a day (TID) | ORAL | Status: DC
  Administered 2018-03-02 – 2018-03-03 (×3): 0.3 mg via ORAL
  Filled 2018-03-02 (×3): qty 1

## 2018-03-02 MED ORDER — ISOSORBIDE MONONITRATE ER 60 MG PO TB24: 60.0000 mg | ORAL_TABLET | Freq: Every day | ORAL | Status: DC

## 2018-03-02 MED ORDER — FUROSEMIDE 10 MG/ML IJ SOLN
20.0000 mg | Freq: Once | INTRAMUSCULAR | Status: AC
  Administered 2018-03-02: 20 mg via INTRAVENOUS
  Filled 2018-03-02: qty 2

## 2018-03-02 MED ORDER — SPIRONOLACTONE 25 MG PO TABS
25.0000 mg | ORAL_TABLET | Freq: Every day | ORAL | Status: DC
  Administered 2018-03-02 – 2018-03-03 (×2): 25 mg via ORAL
  Filled 2018-03-02 (×2): qty 1

## 2018-03-02 NOTE — Progress Notes (Signed)
Admit: 02/26/2018 LOS: 4  59M presenting with acute on chronic renal failure vs progressive CKD, hypertensive emergency secondary to medication nonadherence, gross hematuria  Subjective:  Marland Kitchen BPs improving, still elevated . SCr up to 4.6, K 3.4, HCO3 27 . UOP not documented . Renin and aldosterone levels not resulted . TRH started MRB, long acting nitrate  02/20 0701 - 02/21 0700 In: 123 [P.O.:120; I.V.:3] Out: -   Filed Weights   02/27/18 0306 02/28/18 0523 03/02/18 0500  Weight: 95.4 kg 94.1 kg 95 kg    Scheduled Meds: . amLODipine  10 mg Oral Daily  . carvedilol  12.5 mg Oral BID WC  . cloNIDine  0.3 mg Oral TID  . heparin  5,000 Units Subcutaneous Q8H  . hydrALAZINE  100 mg Oral Q8H  . [START ON 03/03/2018] isosorbide mononitrate  60 mg Oral Daily  . sodium chloride flush  3 mL Intravenous Q12H  . spironolactone  25 mg Oral Daily   Continuous Infusions:  PRN Meds:.  Current Labs: reviewed  Aldo and renin levels from 2/18 pending  Physical Exam:  Blood pressure (!) 155/100, pulse (!) 53, temperature 98.2 F (36.8 C), temperature source Oral, resp. rate 20, height 5' 7.32" (1.71 m), weight 95 kg, SpO2 100 %. NAD, well-appearing, well-developed RRR, normal S1 and S2 Clear bilaterally, normal work of breathing No peripheral edema No abdominal bruits, soft, nontender No rashes or lesions Nonfocal neurological exam  A 1. 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 Hypertensive emergency off nicardipine; uptitrating oral medications; not yet controlled but much improved. Caution with rapid correction. Stop long acting nitrate. Follow labs closey with MRB added 3. Medication nonadherence 4. Gross hematuria, renal ultrasound negative; will need urology follow-up as outpatient especially given #5 5. Tobacco user 6. TCP likely related to #1 7. Hypokalemia, etiology unclear,  improved.  Renin and aldosterone levels pending.  P . Agree with increased oral regimen, follow along to see effect . Renin and aldosterone levels pending, K repleted . We will continue to follow . Will need nephrology follow-up at discharge . Medication Issues; o Preferred narcotic agents for pain control are hydromorphone, fentanyl, and methadone. Morphine should not be used.  o Baclofen should be avoided o Avoid oral sodium phosphate and magnesium citrate based laxatives / bowel preps    Pearson Grippe MD 03/02/2018, 12:39 PM  Recent Labs  Lab 02/28/18 1134 03/01/18 0454 03/02/18 0356  NA 135 136 135  K 3.8 3.6 3.4*  CL 102 101 98  CO2 23 23 27   GLUCOSE 112* 110* 106*  BUN 32* 35* 37*  CREATININE 3.83* 4.38* 4.62*  CALCIUM 9.3 9.3 9.1  PHOS 3.5  --   --    Recent Labs  Lab 02/26/18 1302 02/27/18 0319 03/01/18 0454  WBC 5.7 4.8 5.3  NEUTROABS 3.4  --   --   HGB 14.1 13.8 13.2  HCT 44.3 42.3 41.2  MCV 86.0 84.3 84.4  PLT 100* 97* 127*

## 2018-03-02 NOTE — Progress Notes (Signed)
Unable to administer Carvedilol throughout day as patient's heart rate remains in 50's.

## 2018-03-02 NOTE — Progress Notes (Signed)
PROGRESS NOTE                                                                                                                                                                                                             Patient Demographics:    David [name], is a 48 y.o. male, DOB - Jun 27, 1970, WPY:[id number]  Admit date - 02/26/2018   Admitting Physician Aldean Jewett, MD  Outpatient Primary MD for the patient is Scot Jun, FNP  LOS - 4  Chief Complaint  Patient presents with  . Fever  . Hematuria       Brief Narrative  This is a 48 year old man with medical problems including current everyday smoker and hypertension presenting with gross hematuria and headaches.  History is obtained via chart review as well as patient and family report in addition to talking to the emergency medicine team.  The patient reports his native country as being [place].  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    In the ER he was diagnosed with renal failure, hematuria and hypertensive crisis.   Subjective:   Patient in bed, appears comfortable, denies any headache, no fever, no chest pain or pressure, no shortness of breath , no abdominal pain. No focal weakness.   Assessment  & Plan :     1.  Hypertensive crisis.  Patiently managed in ICU, blood pressure improving but still quite high, currently on high doses of Norvasc, Coreg, hydralazine, Catapres and Imdur.  Have increased Catapres and Imdur dose on 03/02/2018.  Monitor.  Will also give trial of Aldactone and Lasix.  Aldosterone/renin ratio pending.  2.  Renal failure.  Likely ARF on CKD.  Baseline renal function unknown however ultrasound does suggest chronic medical disease stage unknown, nephrology following.  Currently no need for dialysis.  3.  Hematuria.  Renal ultrasound stable.  Defer further management to nephrology.  Could have Nephritic syndrome.  4.  Smoker counseled to quit.    Family Communication  :  None  Code Status :  Full  Disposition Plan  :  TBD  Consults  :  Renal, PCCM  Procedures  :    Renal US - Chr. medical kidney disease.  DVT Prophylaxis  :    Heparin    Lab Results  Component Value Date   PLT 127 (L) 03/01/2018    Diet :  Diet Order  Diet renal/carb modified with fluid restriction Diet-HS Snack? Nothing; Fluid restriction: 1200 mL Fluid; Room service appropriate? Yes; Fluid consistency: Thin  Diet effective now               Inpatient Medications Scheduled Meds: . amLODipine  10 mg Oral Daily  . carvedilol  12.5 mg Oral BID WC  . cloNIDine  0.3 mg Oral TID  . heparin  5,000 Units Subcutaneous Q8H  . hydrALAZINE  100 mg Oral Q8H  . [START ON 03/03/2018] isosorbide mononitrate  60 mg Oral Daily  . sodium chloride flush  3 mL Intravenous Q12H  . spironolactone  25 mg Oral Daily   Continuous Infusions: PRN Meds:.acetaminophen, hydrALAZINE  Antibiotics  :   Anti-infectives (From admission, onward)   None          Objective:   Vitals:   03/02/18 0500 03/02/18 0525 03/02/18 0820 03/02/18 0821  BP:  (!) 156/105  (!) 175/117  Pulse:  (!) 56 (!) 55 (!) 55  Resp:      Temp:  98.5 F (36.9 C)    TempSrc:  Oral    SpO2:  100%    Weight: 95 kg     Height:        Wt Readings from Last 3 Encounters:  03/02/18 95 kg  08/15/16 102.5 kg  08/06/16 136.1 kg     Intake/Output Summary (Last 24 hours) at 03/02/2018 0954 Last data filed at 03/02/2018 0300 Gross per 24 hour  Intake 123 ml  Output -  Net 123 ml     Physical Exam  Awake Alert, Oriented X 3, No new F.N deficits, Normal affect [place].AT,PERRAL Supple Neck,No JVD, No cervical lymphadenopathy appriciated.  Symmetrical Chest wall movement, Good air movement bilaterally, CTAB RRR,No Gallops, Rubs or new Murmurs, No Parasternal Heave +ve B.Sounds, Abd Soft, No tenderness, No organomegaly appriciated, No rebound -  guarding or rigidity. No Cyanosis, Clubbing or edema, No new Rash or bruise    Data Review:    CBC Recent Labs  Lab 02/26/18 1302 02/27/18 0319 03/01/18 0454  WBC 5.7 4.8 5.3  HGB 14.1 13.8 13.2  HCT 44.3 42.3 41.2  PLT 100* 97* 127*  MCV 86.0 84.3 84.4  MCH 27.4 27.5 27.0  MCHC 31.8 32.6 32.0  RDW 15.1 15.0 15.1  LYMPHSABS 1.5  --   --   MONOABS 0.8  --   --   EOSABS 0.1  --   --   BASOSABS 0.0  --   --     Chemistries  Recent Labs  Lab 02/26/18 1715 02/27/18 0319 02/28/18 1134 03/01/18 0454 03/02/18 0356  NA 134* 135 135 136 135  K 3.2* 3.1* 3.8 3.6 3.4*  CL 97* 99 102 101 98  CO2 26 25 23 23 27   GLUCOSE 159* 117* 112* 110* 106*  BUN 35* 31* 32* 35* 37*  CREATININE 4.06* 3.77* 3.83* 4.38* 4.62*  CALCIUM 9.1 9.3 9.3 9.3 9.1  AST 21 20  --   --   --   ALT 12 13  --   --   --   ALKPHOS 66 70  --   --   --   BILITOT 1.1 1.2  --   --   --    ------------------------------------------------------------------------------------------------------------------ No results for input(s): CHOL, HDL, LDLCALC, TRIG, CHOLHDL, LDLDIRECT in the last 72 hours.  Lab Results  Component Value Date   HGBA1C 5.6 08/15/2016   ------------------------------------------------------------------------------------------------------------------ No results for input(s): TSH, T4TOTAL, T3FREE,  THYROIDAB in the last 72 hours.  Invalid input(s): FREET3 ------------------------------------------------------------------------------------------------------------------ No results for input(s): VITAMINB12, FOLATE, FERRITIN, TIBC, IRON, RETICCTPCT in the last 72 hours.  Coagulation profile Recent Labs  Lab 02/27/18 0319  INR 1.00    No results for input(s): DDIMER in the last 72 hours.  Cardiac Enzymes Recent Labs  Lab 02/26/18 1820  CKMB 2.4   ------------------------------------------------------------------------------------------------------------------ No results found for:  BNP  Micro Results Recent Results (from the past 240 hour(s))  Urine culture     Status: None   Collection Time: 02/26/18  5:57 PM  Result Value Ref Range Status   Specimen Description URINE, RANDOM  Final   Special Requests NONE  Final   Culture   Final    NO GROWTH Performed at [facility], [place] [address], [place], [place] [address]    Report Status 02/27/2018 FINAL  Final  MRSA PCR Screening     Status: None   Collection Time: 02/27/18  3:37 AM  Result Value Ref Range Status   MRSA by PCR NEGATIVE NEGATIVE Final    Comment:        The GeneXpert MRSA Assay (FDA approved for NASAL specimens only), is one component of a comprehensive MRSA colonization surveillance program. It is not intended to diagnose MRSA infection nor to guide or monitor treatment for MRSA infections. Performed at [facility], [place] [address] [place], [place] [address]     Radiology Reports US Renal  Result Date: 02/27/2018 CLINICAL DATA:  Initial evaluation for acute renal failure EXAM: RENAL / URINARY TRACT ULTRASOUND COMPLETE COMPARISON:  None. FINDINGS: Right Kidney: Renal measurements: 10.0 x 4.4 x 5.5 cm = volume: 126.1 mL. Diffusely increased echogenicity within the renal parenchyma. No mass or hydronephrosis visualized. Left Kidney: Renal measurements: 9.9 x 7.6 x 5.3 cm = volume: 206.9 mL. Diffusely increased echogenicity within the renal parenchyma. No mass or hydronephrosis visualized. Bladder: Appears normal for degree of bladder distention. IMPRESSION: 1. Diffusely increased echogenicity within the renal parenchyma, compatible with medical renal disease. 2. No hydronephrosis. Electronically Signed   By: Jeannine Boga M.D.   On: 02/27/2018 04:58    Time Spent in minutes  30   Lala Lund M.D on 03/02/2018 at 9:54 AM  To page go to [email or web address] - password [facility]

## 2018-03-03 LAB — ALDOSTERONE + RENIN ACTIVITY W/ RATIO
ALDO / PRA Ratio: 3.6 (ref 0.0–30.0)
Aldosterone: 20.9 ng/dL (ref 0.0–30.0)
PRA LC/MS/MS: 5.821 ng/mL/hr — ABNORMAL HIGH (ref 0.167–5.380)

## 2018-03-03 MED ORDER — CARVEDILOL 12.5 MG PO TABS: 12.5000 mg | ORAL_TABLET | Freq: Two times a day (BID) | ORAL | 0 refills | Status: DC

## 2018-03-03 MED ORDER — AMLODIPINE BESYLATE 10 MG PO TABS: 10.0000 mg | ORAL_TABLET | Freq: Every day | ORAL | 0 refills | Status: DC

## 2018-03-03 MED ORDER — SPIRONOLACTONE 25 MG PO TABS: 25.0000 mg | ORAL_TABLET | Freq: Every day | ORAL | 0 refills | Status: DC

## 2018-03-03 MED ORDER — CLONIDINE HCL 0.3 MG PO TABS: 0.3000 mg | ORAL_TABLET | Freq: Three times a day (TID) | ORAL | 0 refills | Status: DC

## 2018-03-03 MED ORDER — HYDRALAZINE HCL 100 MG PO TABS: 100.0000 mg | ORAL_TABLET | Freq: Three times a day (TID) | ORAL | 0 refills | Status: DC

## 2018-03-03 MED ORDER — POTASSIUM CHLORIDE CRYS ER 20 MEQ PO TBCR: 20.0000 meq | EXTENDED_RELEASE_TABLET | Freq: Once | ORAL | Status: DC

## 2018-03-03 NOTE — Discharge Instructions (Signed)
Follow with Primary MD Scot Jun, FNP in 7 days   Get CBC, CMP,  checked  by Primary MD  in 5-7 days   Activity: As tolerated with Full fall precautions use walker/cane & assistance as needed  Disposition Home   Diet: Heart Healthy Low Carb, 1.5lit/day fluid restriction.  Special Instructions: If you have smoked or chewed Tobacco  in the last 2 yrs please stop smoking, stop any regular Alcohol  and or any Recreational drug use.  On your next visit with your primary care physician please Get Medicines reviewed and adjusted.  Please request your Prim.MD to go over all Hospital Tests and Procedure/Radiological results at the follow up, please get all Hospital records sent to your Prim MD by signing hospital release before you go home.  If you experience worsening of your admission symptoms, develop shortness of breath, life threatening emergency, suicidal or homicidal thoughts you must seek medical attention immediately by calling 911 or calling your MD immediately  if symptoms less severe.  You Must read complete instructions/literature along with all the possible adverse reactions/side effects for all the Medicines you take and that have been prescribed to you. Take any new Medicines after you have completely understood and accpet all the possible adverse reactions/side effects.

## 2018-03-03 NOTE — Discharge Summary (Signed)
David [name] IRJ:[id number] DOB: 05-Jul-1970 DOA: 02/26/2018  PCP: Scot Jun, FNP  Admit date: 02/26/2018  Discharge date: 03/03/2018  Admitted From: Home  Disposition:  Home   Recommendations for Outpatient Follow-up:   Follow up with PCP in 1-2 weeks  PCP Please obtain BMP/CBC, 2 view CXR in 1week,  (see Discharge instructions)   PCP Please follow up on the following pending results:    Home Health: None   Equipment/Devices: None  Consultations: Renal Discharge Condition: Stable   CODE STATUS: Full   Diet Recommendation: Heart Healthy - Low Carb    Chief Complaint  Patient presents with  . Fever  . Hematuria     Brief history of present illness from the day of admission and additional interim summary    This is a 48 year old man with medical problems including current everyday smoker and hypertension presenting with gross hematuria and headaches. History is obtained via chart review as well as patient and family report in addition to talking to the emergency medicine team.  The patient reports his [place]. 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   In the ER he was diagnosed with renal failure, hematuria and hypertensive crisis.                                                                  Hospital Course     1.  Hypertensive crisis.  Pressure was somewhat hard to control finally blood pressure stable on multiple high-dose blood pressure medications which include Norvasc, Coreg, hydralazine and Catapres.    Blood pressure now stable, continue this combination follow closely outpatient with renal and PCP.  Aldosterone/renin ratio pending.  But he had hyperkalemia hence suspect hyperammonemia, low-dose  Aldactone has been started.  PCP and nephrology to monitor renal function, potassium and blood pressure closely.  2.  Renal failure.  Likely ARF on CKD.  Baseline renal function unknown however ultrasound does suggest chronic medical disease stage unknown, nephrology following.  Currently no need for dialysis.  3.  Hematuria.  Renal ultrasound stable.  Defer further management to nephrology.  Could have Nephritic syndrome.  4. Smoker counseled to quit.   Discharge diagnosis     Principal Problem:   Hypertensive emergency Active Problems:   ARF (acute renal failure) (HCC)   Smoker   Hematuria    Discharge instructions    Discharge Instructions    Diet - low sodium heart healthy   Complete by:  As directed    Discharge instructions   Complete by:  As directed    Follow with Primary MD Scot Jun, FNP in 7 days   Get CBC, CMP,  checked  by Primary MD  in 5-7 days   Activity: As tolerated with Full fall precautions use walker/cane & assistance as needed  Disposition Home   Diet: Heart Healthy Low Carb, 1.5lit/day fluid restriction.  Special Instructions: If you have smoked or chewed Tobacco  in the last 2 yrs please stop smoking, stop any regular Alcohol  and or any Recreational drug use.  On your next visit with your primary care physician please Get Medicines reviewed and adjusted.  Please request your Prim.MD to go over all Hospital Tests and Procedure/Radiological results at the follow up, please get all Hospital records sent to your Prim MD by signing hospital release before you go home.  If you experience worsening of your admission symptoms, develop shortness of breath, life threatening emergency, suicidal or homicidal thoughts you must seek medical attention immediately by calling 911 or calling your MD immediately  if symptoms less severe.  You Must read complete instructions/literature along with all the possible adverse reactions/side effects for all the  Medicines you take and that have been prescribed to you. Take any new Medicines after you have completely understood and accpet all the possible adverse reactions/side effects.   Increase activity slowly   Complete by:  As directed       Discharge Medications   Allergies as of 03/03/2018   No Known Allergies     Medication List    STOP taking these medications   lisinopril 10 MG tablet Commonly known as:  PRINIVIL,ZESTRIL     TAKE these medications   amLODipine 10 MG tablet Commonly known as:  NORVASC Take 1 tablet (10 mg total) by mouth daily.   aspirin EC 81 MG tablet Take 1 tablet (81 mg total) by mouth daily.   carvedilol 12.5 MG tablet Commonly known as:  COREG Take 1 tablet (12.5 mg total) by mouth 2 (two) times daily with a meal.   cloNIDine 0.3 MG tablet Commonly known as:  CATAPRES Take 1 tablet (0.3 mg total) by mouth 3 (three) times daily.   hydrALAZINE 100 MG tablet Commonly known as:  APRESOLINE Take 1 tablet (100 mg total) by mouth every 8 (eight) hours.   spironolactone 25 MG tablet Commonly known as:  ALDACTONE Take 1 tablet (25 mg total) by mouth daily. Start taking on:  March 04, 2018       Follow-up Information    [facility]. Go on 03/22/2018.   Why:  9:30 am, Molli Barrows NP Contact information: [address] [place] [address]       Scot Jun, FNP. Schedule an appointment as soon as possible for a visit in 1 week(s).   Specialty:  Family Medicine Contact information: [place] [address] [phone]        Rexene Agent, MD. Schedule an appointment as soon as possible for a visit in 1 week(s).   Specialty:  Nephrology Contact information: [place] [address] [phone]           Major procedures and Radiology Reports - PLEASE review detailed and final reports thoroughly  -       US Renal  Result Date:  02/27/2018 CLINICAL DATA:  Initial evaluation for acute renal failure EXAM: RENAL / URINARY TRACT ULTRASOUND COMPLETE COMPARISON:  None. FINDINGS: Right Kidney: Renal measurements: 10.0 x 4.4 x 5.5 cm = volume: 126.1 mL. Diffusely increased echogenicity within the renal parenchyma. No mass or hydronephrosis visualized. Left Kidney: Renal measurements: 9.9 x 7.6  x 5.3 cm = volume: 206.9 mL. Diffusely increased echogenicity within the renal parenchyma. No mass or hydronephrosis visualized. Bladder: Appears normal for degree of bladder distention. IMPRESSION: 1. Diffusely increased echogenicity within the renal parenchyma, compatible with medical renal disease. 2. No hydronephrosis. Electronically Signed   By: Jeannine Boga M.D.   On: 02/27/2018 04:58    Micro Results    Recent Results (from the past 240 hour(s))  Urine culture     Status: None   Collection Time: 02/26/18  5:57 PM  Result Value Ref Range Status   Specimen Description URINE, RANDOM  Final   Special Requests NONE  Final   Culture   Final    NO GROWTH Performed at [facility], [address], [place], [place] [address]    Report Status 02/27/2018 FINAL  Final  MRSA PCR Screening     Status: None   Collection Time: 02/27/18  3:37 AM  Result Value Ref Range Status   MRSA by PCR NEGATIVE NEGATIVE Final    Comment:        The GeneXpert MRSA Assay (FDA approved for NASAL specimens only), is one component of a comprehensive MRSA colonization surveillance program. It is not intended to diagnose MRSA infection nor to guide or monitor treatment for MRSA infections. Performed at [facility], [place] [address], [place], [place] [address]     Today   [place] today has no headache,no chest abdominal pain,no new weakness tingling or numbness, feels much better wants to go home today.     Objective   Blood pressure (!) 160/93, pulse 61, temperature 98.1 F (36.7 C), temperature source Oral,  resp. rate 16, height 5' 7.32" (1.71 m), weight 94.8 kg, SpO2 100 %.  No intake or output data in the 24 hours ending 03/03/18 0919  Exam Awake Alert, Oriented x 3, No new F.N deficits, Normal affect [place].AT,PERRAL Supple Neck,No JVD, No cervical lymphadenopathy appriciated.  Symmetrical Chest wall movement, Good air movement bilaterally, CTAB RRR,No Gallops,Rubs or new Murmurs, No Parasternal Heave +ve B.Sounds, Abd Soft, Non tender, No organomegaly appriciated, No rebound -guarding or rigidity. No Cyanosis, Clubbing or edema, No new Rash or bruise   Data Review   CBC w Diff:  Lab Results  Component Value Date   WBC 5.3 03/01/2018   HGB 13.2 03/01/2018   HCT 41.2 03/01/2018   PLT 127 (L) 03/01/2018   LYMPHOPCT 26 02/26/2018   MONOPCT 14 02/26/2018   EOSPCT 1 02/26/2018   BASOPCT 0 02/26/2018    CMP:  Lab Results  Component Value Date   NA 135 03/02/2018   K 3.4 (L) 03/02/2018   CL 98 03/02/2018   CO2 27 03/02/2018   BUN 37 (H) 03/02/2018   CREATININE 4.62 (H) 03/02/2018   CREATININE 1.38 (H) 08/15/2016   PROT 7.2 02/27/2018   ALBUMIN 4.0 02/28/2018   BILITOT 1.2 02/27/2018   ALKPHOS 70 02/27/2018   AST 20 02/27/2018   ALT 13 02/27/2018     Total Time in preparing paper work, data evaluation and todays exam - 61 minutes  Lala Lund M.D on 03/03/2018 at 9:19 AM  [facility]   Office  [phone]

## 2018-03-03 NOTE — Progress Notes (Signed)
Patient provided with [facility] letter.

## 2018-03-03 NOTE — Progress Notes (Signed)
Nsg Discharge Note  Admit Date:  02/26/2018 Discharge date: 03/03/2018   David [name] to be D/C'd Home per MD order.  AVS completed.  Copy for chart, and copy for patient signed, and dated. Patient/caregiver able to verbalize understanding.  Discharge Medication: Allergies as of 03/03/2018   No Known Allergies     Medication List    STOP taking these medications   lisinopril 10 MG tablet Commonly known as:  PRINIVIL,ZESTRIL     TAKE these medications   amLODipine 10 MG tablet Commonly known as:  NORVASC Take 1 tablet (10 mg total) by mouth daily.   aspirin EC 81 MG tablet Take 1 tablet (81 mg total) by mouth daily.   carvedilol 12.5 MG tablet Commonly known as:  COREG Take 1 tablet (12.5 mg total) by mouth 2 (two) times daily with a meal.   cloNIDine 0.3 MG tablet Commonly known as:  CATAPRES Take 1 tablet (0.3 mg total) by mouth 3 (three) times daily.   hydrALAZINE 100 MG tablet Commonly known as:  APRESOLINE Take 1 tablet (100 mg total) by mouth every 8 (eight) hours.   spironolactone 25 MG tablet Commonly known as:  ALDACTONE Take 1 tablet (25 mg total) by mouth daily. Start taking on:  March 04, 2018       Discharge Assessment: Vitals:   03/03/18 0631 03/03/18 0821  BP: (!) 136/94 (!) 160/93  Pulse: 63 61  Resp: 16   Temp: 98.1 F (36.7 C)   SpO2: 100%    Skin clean, dry and intact without evidence of skin break down, no evidence of skin tears noted. IV catheter discontinued intact. Site without signs and symptoms of complications - no redness or edema noted at insertion site, patient denies c/o pain - only slight tenderness at site.  Dressing with slight pressure applied.  D/c Instructions-Education: Discharge instructions given to patient/family with verbalized understanding. D/c education completed with patient/family including follow up instructions, medication list, d/c activities limitations if indicated, with other d/c instructions as indicated  by MD - patient able to verbalize understanding, all questions fully answered. Patient instructed to return to ED, call 911, or call MD for any changes in condition.  Patient escorted via [place], and D/C home via private auto.  Tresa Endo, RN 03/03/2018 11:31 AM

## 2018-03-07 ENCOUNTER — Ambulatory Visit (INDEPENDENT_AMBULATORY_CARE_PROVIDER_SITE_OTHER): Payer: Self-pay | Admitting: Family Medicine

## 2018-03-07 ENCOUNTER — Encounter: Payer: Self-pay | Admitting: Family Medicine

## 2018-03-07 VITALS — BP 128/96 | HR 72 | Temp 98.4°F | Ht 67.32 in | Wt 221.0 lb

## 2018-03-07 DIAGNOSIS — Z131 Encounter for screening for diabetes mellitus: Secondary | ICD-10-CM

## 2018-03-07 DIAGNOSIS — Z7689 Persons encountering health services in other specified circumstances: Secondary | ICD-10-CM | POA: Insufficient documentation

## 2018-03-07 DIAGNOSIS — Z0000 Encounter for general adult medical examination without abnormal findings: Secondary | ICD-10-CM

## 2018-03-07 DIAGNOSIS — Z125 Encounter for screening for malignant neoplasm of prostate: Secondary | ICD-10-CM

## 2018-03-07 DIAGNOSIS — Z09 Encounter for follow-up examination after completed treatment for conditions other than malignant neoplasm: Secondary | ICD-10-CM

## 2018-03-07 DIAGNOSIS — I10 Essential (primary) hypertension: Secondary | ICD-10-CM

## 2018-03-07 LAB — POCT GLYCOSYLATED HEMOGLOBIN (HGB A1C): Hemoglobin A1C: 5.2 % (ref 4.0–5.6)

## 2018-03-07 LAB — POCT URINALYSIS DIP (MANUAL ENTRY)
Bilirubin, UA: NEGATIVE
Blood, UA: NEGATIVE
Glucose, UA: NEGATIVE mg/dL
Ketones, POC UA: NEGATIVE mg/dL
Leukocytes, UA: NEGATIVE
Nitrite, UA: NEGATIVE
Protein Ur, POC: 100 mg/dL — AB
Spec Grav, UA: 1.02 (ref 1.010–1.025)
Urobilinogen, UA: 0.2 E.U./dL
pH, UA: 5.5 (ref 5.0–8.0)

## 2018-03-07 NOTE — Progress Notes (Signed)
Patient [facility] Follow Up--Re-establish Care   Subjective:  Patient ID: David [name], male    DOB: [birth year]  Age: 48 y.o. MRN: [id number]  CC:  Chief Complaint  Patient presents with  . Establish Care  . Hospitalization Follow-up    HTN     HPI David [name] is a 48 year old male who presents for Hospital Follow Up and to Re-establish Care today.   Past Medical History:  Diagnosis Date  . Hypertension    Current Status: Since his last office visit, he has a Hospital Admission 02/26/2018-03/03/2018 for elevated blood pressures. Today he is doing well with no complaints. He frequently travels to [place]. His last visit abroad was 6 months ago. He usually travels abroad there every 2 years or so. He denies visual changes, chest pain, cough, shortness of breath, heart palpitations, and falls. He has occasional headaches and dizziness with position changes. Denies severe headaches, confusion, seizures, double vision, and blurred vision, nausea and vomiting. His anxiety is mild today. He denies suicidal ideations, homicidal ideations, or auditory hallucinations.   He denies fevers, chills, fatigue, recent infections, weight loss, and night sweats. No reports of GI problems such as diarrhea, and constipation. She has no reports of blood in stools, dysuria and hematuria. He denies pain today.   No past surgical history on file.  Family History  Family history unknown: Yes    Social History   Socioeconomic History  . Marital status: Married    Spouse name: Not on file  . Number of children: Not on file  . Years of education: Not on file  . Highest education level: Not on file  Occupational History  . Not on file  Social Needs  . Financial resource strain: Not on file  . Food insecurity:    Worry: Not on file    Inability: Not on file  . Transportation needs:    Medical: Not on file    Non-medical: Not on file    Tobacco Use  . Smoking status: Current Every Day Smoker  . Smokeless tobacco: Never Used  Substance and Sexual Activity  . Alcohol use: No  . Drug use: No  . Sexual activity: Not on file  Lifestyle  . Physical activity:    Days per week: Not on file    Minutes per session: Not on file  . Stress: Not on file  Relationships  . Social connections:    Talks on phone: Not on file    Gets together: Not on file    Attends religious service: Not on file    Active member of club or organization: Not on file    Attends meetings of clubs or organizations: Not on file    Relationship status: Not on file  . Intimate partner violence:    Fear of current or ex partner: Not on file    Emotionally abused: Not on file    Physically abused: Not on file    Forced sexual activity: Not on file  Other Topics Concern  . Not on file  Social History Narrative  . Not on file    Outpatient Medications Prior to Visit  Medication Sig Dispense Refill  . amLODipine (NORVASC) 10 MG tablet Take 1 tablet (10 mg total) by mouth daily. 90 tablet 0  . aspirin EC 81 MG tablet Take 1 tablet (81 mg total) by mouth daily. 90 tablet 2  . carvedilol (COREG)  12.5 MG tablet Take 1 tablet (12.5 mg total) by mouth 2 (two) times daily with a meal. 60 tablet 0  . cloNIDine (CATAPRES) 0.3 MG tablet Take 1 tablet (0.3 mg total) by mouth 3 (three) times daily. 90 tablet 0  . hydrALAZINE (APRESOLINE) 100 MG tablet Take 1 tablet (100 mg total) by mouth every 8 (eight) hours. 90 tablet 0  . spironolactone (ALDACTONE) 25 MG tablet Take 1 tablet (25 mg total) by mouth daily. 30 tablet 0   No facility-administered medications prior to visit.     No Known Allergies  ROS Review of Systems  Constitutional: Positive for fatigue (increased ).       Increased tiredness  HENT: Negative.   Eyes: Negative.   Respiratory: Negative.   Cardiovascular: Negative.   Gastrointestinal: Negative.   Endocrine: Negative.   Genitourinary:  Negative.   Musculoskeletal: Negative.   Skin: Negative.   Allergic/Immunologic: Negative.   Neurological: Positive for dizziness, syncope, weakness, light-headedness and headaches.  Hematological: Negative.   Psychiatric/Behavioral: Positive for sleep disturbance (night awakening).   Objective:    Physical Exam  Constitutional: He is oriented to person, place, and time. He appears well-developed and well-nourished.  HENT:  Head: Normocephalic and atraumatic.  Eyes: Conjunctivae are normal.  Neck: Normal range of motion. Neck supple.  Cardiovascular: Normal rate, regular rhythm, normal heart sounds and intact distal pulses.  Pulmonary/Chest: Effort normal and breath sounds normal.  Abdominal: Soft. Bowel sounds are normal.  Musculoskeletal: Normal range of motion.  Neurological: He is alert and oriented to person, place, and time. He has normal reflexes.  Skin: Skin is warm and dry.  Psychiatric: He has a normal mood and affect. His behavior is normal. Judgment and thought content normal.  Nursing note and vitals reviewed.   BP (!) 128/96   Pulse 72   Temp 98.4 F (36.9 C) (Oral)   Ht 5' 7.32" (1.71 m)   Wt 221 lb (100.2 kg)   SpO2 100%   BMI 34.29 kg/m  Wt Readings from Last 3 Encounters:  03/07/18 221 lb (100.2 kg)  03/03/18 208 lb 15.9 oz (94.8 kg)  08/15/16 226 lb (102.5 kg)     Health Maintenance Due  Topic Date Due  . INFLUENZA VACCINE  08/10/2017    There are no preventive care reminders to display for this patient.  Lab Results  Component Value Date   TSH 3.071 02/27/2018   Lab Results  Component Value Date   WBC 5.3 03/01/2018   HGB 13.2 03/01/2018   HCT 41.2 03/01/2018   MCV 84.4 03/01/2018   PLT 127 (L) 03/01/2018   Lab Results  Component Value Date   NA 135 03/02/2018   K 3.4 (L) 03/02/2018   CO2 27 03/02/2018   GLUCOSE 106 (H) 03/02/2018   BUN 37 (H) 03/02/2018   CREATININE 4.62 (H) 03/02/2018   BILITOT 1.2 02/27/2018   ALKPHOS 70  02/27/2018   AST 20 02/27/2018   ALT 13 02/27/2018   PROT 7.2 02/27/2018   ALBUMIN 4.0 02/28/2018   CALCIUM 9.1 03/02/2018   ANIONGAP 10 03/02/2018   Lab Results  Component Value Date   CHOL 202 (H) 08/15/2016   Lab Results  Component Value Date   HDL 48 08/15/2016   Lab Results  Component Value Date   LDLCALC 134 (H) 08/15/2016   Lab Results  Component Value Date   TRIG 100 08/15/2016   Lab Results  Component Value Date   CHOLHDL 4.2 08/15/2016  Lab Results  Component Value Date   HGBA1C 5.2 03/07/2018   Assessment & Plan:   1. Hospital discharge follow-up  2. Encounter to establish care  3. Essential hypertension Blood pressure at 128/96 today. He will follow up for blood pressure recheck in 1 week.  - Lipid Panel - CBC with Differential - Comprehensive metabolic panel - Vitamin D, 25-hydroxy - Vitamin B12  4. Screening for diabetes mellitus Hgb A1c is stable at 5.2 today. He will continue to decrease foods/beverages high in sugars and carbs and follow Heart Healthy or DASH diet. Increase physical activity to at least 30 minutes cardio exercise daily.  - POCT glycosylated hemoglobin (Hb A1C) - POCT urinalysis dipstick  5. Healthcare maintenance - Lipid Panel - CBC with Differential - Comprehensive metabolic panel - Vitamin D, 25-hydroxy - Vitamin B12 - PSA  6. Prostate cancer screening Results are pending.  - PSA  7. Follow up He will follow up in 1 week for blood pressure recheck.  He will follow up in 1 month for office visit.   No orders of the defined types were placed in this encounter.   Orders Placed This Encounter  Procedures  . Lipid Panel  . CBC with Differential  . Comprehensive metabolic panel  . Vitamin D, 25-hydroxy  . Vitamin B12  . PSA  . POCT glycosylated hemoglobin (Hb A1C)  . POCT urinalysis dipstick   Referral Orders  No referral(s) requested today    Kathe Becton,  MSN, FNP-C [facility] [address] [place], [place] [address] [phone]   Problem List Items Addressed This Visit    None    Visit Diagnoses    Hospital discharge follow-up    -  Primary   Encounter to establish care       Essential hypertension       Relevant Orders   Lipid Panel   CBC with Differential   Comprehensive metabolic panel   Vitamin D, 25-hydroxy   Vitamin B12   Screening for diabetes mellitus       Relevant Orders   POCT glycosylated hemoglobin (Hb A1C) (Completed)   POCT urinalysis dipstick (Completed)   Healthcare maintenance       Relevant Orders   Lipid Panel   CBC with Differential   Comprehensive metabolic panel   Vitamin D, 25-hydroxy   Vitamin B12   PSA   Prostate cancer screening       Relevant Orders   PSA   Follow up          No orders of the defined types were placed in this encounter.   Follow-up: Return in about 1 month (around 04/05/2018).    Azzie Glatter, FNP

## 2018-03-08 ENCOUNTER — Telehealth: Payer: Self-pay

## 2018-03-08 LAB — CBC WITH DIFFERENTIAL/PLATELET
Basophils Absolute: 0 10*3/uL (ref 0.0–0.2)
Basos: 1 %
EOS (ABSOLUTE): 0.1 10*3/uL (ref 0.0–0.4)
Eos: 2 %
Hematocrit: 37.5 % (ref 37.5–51.0)
Hemoglobin: 12.4 g/dL — ABNORMAL LOW (ref 13.0–17.7)
Immature Grans (Abs): 0 10*3/uL (ref 0.0–0.1)
Immature Granulocytes: 0 %
Lymphocytes Absolute: 1.1 10*3/uL (ref 0.7–3.1)
Lymphs: 24 %
MCH: 28.2 pg (ref 26.6–33.0)
MCHC: 33.1 g/dL (ref 31.5–35.7)
MCV: 85 fL (ref 79–97)
Monocytes Absolute: 0.7 10*3/uL (ref 0.1–0.9)
Monocytes: 15 %
Neutrophils Absolute: 2.7 10*3/uL (ref 1.4–7.0)
Neutrophils: 58 %
Platelets: 299 10*3/uL (ref 150–450)
RBC: 4.39 x10E6/uL (ref 4.14–5.80)
RDW: 14.3 % (ref 11.6–15.4)
WBC: 4.7 10*3/uL (ref 3.4–10.8)

## 2018-03-08 LAB — COMPREHENSIVE METABOLIC PANEL
ALT: 8 IU/L (ref 0–44)
AST: 10 IU/L (ref 0–40)
Albumin/Globulin Ratio: 2 (ref 1.2–2.2)
Albumin: 4.4 g/dL (ref 4.0–5.0)
Alkaline Phosphatase: 76 IU/L (ref 39–117)
BUN/Creatinine Ratio: 10 (ref 9–20)
BUN: 43 mg/dL — ABNORMAL HIGH (ref 6–24)
Bilirubin Total: <0.2 mg/dL (ref 0.0–1.2)
CO2: 21 mmol/L (ref 20–29)
Calcium: 9.4 mg/dL (ref 8.7–10.2)
Chloride: 98 mmol/L (ref 96–106)
Creatinine, Ser: 4.3 mg/dL — ABNORMAL HIGH (ref 0.76–1.27)
GFR calc Af Amer: 18 mL/min/{1.73_m2} — ABNORMAL LOW (ref >59)
GFR calc non Af Amer: 15 mL/min/{1.73_m2} — ABNORMAL LOW (ref >59)
Globulin, Total: 2.2 g/dL (ref 1.5–4.5)
Glucose: 102 mg/dL — ABNORMAL HIGH (ref 65–99)
Potassium: 4.7 mmol/L (ref 3.5–5.2)
Sodium: 134 mmol/L (ref 134–144)
Total Protein: 6.6 g/dL (ref 6.0–8.5)

## 2018-03-08 LAB — LIPID PANEL
Chol/HDL Ratio: 3.8 ratio (ref 0.0–5.0)
Cholesterol, Total: 200 mg/dL — ABNORMAL HIGH (ref 100–199)
HDL: 53 mg/dL (ref >39)
LDL Calculated: 135 mg/dL — ABNORMAL HIGH (ref 0–99)
Triglycerides: 61 mg/dL (ref 0–149)
VLDL Cholesterol Cal: 12 mg/dL (ref 5–40)

## 2018-03-08 LAB — VITAMIN D 25 HYDROXY (VIT D DEFICIENCY, FRACTURES): Vit D, 25-Hydroxy: 21.8 ng/mL — ABNORMAL LOW (ref 30.0–100.0)

## 2018-03-08 LAB — PSA: Prostate Specific Ag, Serum: 0.4 ng/mL (ref 0.0–4.0)

## 2018-03-08 LAB — VITAMIN B12: Vitamin B-12: 817 pg/mL (ref 232–1245)

## 2018-03-08 NOTE — Telephone Encounter (Signed)
Left a vm for patient to callback 

## 2018-03-08 NOTE — Telephone Encounter (Signed)
-----   Message from Azzie Glatter, [place] sent at 03/07/2018 11:12 PM EST ----- Regarding: "Blood Pressure Meds" Please inform patient to continue all bp medications as prescribed. Schedule to have him come in for Nurse visit only for blood pressure re-check in 1 week.

## 2018-03-13 ENCOUNTER — Telehealth: Payer: Self-pay

## 2018-03-13 ENCOUNTER — Other Ambulatory Visit: Payer: Self-pay | Admitting: Family Medicine

## 2018-03-13 DIAGNOSIS — R7989 Other specified abnormal findings of blood chemistry: Secondary | ICD-10-CM

## 2018-03-13 NOTE — Telephone Encounter (Signed)
-----   Message from Azzie Glatter, [place] sent at 03/07/2018 11:12 PM EST ----- Regarding: "Blood Pressure Meds" Please inform patient to continue all bp medications as prescribed. Schedule to have him come in for Nurse visit only for blood pressure re-check in 1 week.

## 2018-03-13 NOTE — Telephone Encounter (Signed)
Patient schedule for Friday to come in for blood pressure check

## 2018-03-16 ENCOUNTER — Ambulatory Visit: Payer: Self-pay | Admitting: Family Medicine

## 2018-03-16 VITALS — BP 162/80 | HR 64 | Ht 67.0 in | Wt 221.0 lb

## 2018-03-16 DIAGNOSIS — I10 Essential (primary) hypertension: Secondary | ICD-10-CM

## 2018-03-16 DIAGNOSIS — Z09 Encounter for follow-up examination after completed treatment for conditions other than malignant neoplasm: Secondary | ICD-10-CM

## 2018-03-16 DIAGNOSIS — E559 Vitamin D deficiency, unspecified: Secondary | ICD-10-CM

## 2018-03-16 MED ORDER — CLONIDINE HCL 0.1 MG PO TABS
0.2000 mg | ORAL_TABLET | Freq: Once | ORAL | Status: AC
  Administered 2018-03-16: 0.2 mg via ORAL

## 2018-03-16 MED ORDER — CALCIUM CARBONATE-VITAMIN D 500-200 MG-UNIT PO TABS: 1.0000 | ORAL_TABLET | Freq: Every day | ORAL | 3 refills | Status: DC

## 2018-03-16 NOTE — Progress Notes (Signed)
Patient [facility]  Established Patient Office Visit  Subjective:  Patient ID: David [name], male    DOB: 07-16-1970  Age: 48 y.o. MRN: [id number]  CC:  Chief Complaint  Patient presents with  . Blood Pressure Check    HPI David [name] is a 48 year old male who presents for follow up of Hypertension.   Past Medical History:  Diagnosis Date  . Hypertension   . Vitamin D deficiency    Current Status: Since her last office visit, she is doing well with no complaints. Referral to Nephology made on 03/13/2018 and patient followed up on 03/15/2018. He denies fevers, chills, fatigue, recent infections, weight loss, and night sweats. He denies visual changes, chest pain, cough, shortness of breath, heart palpitations, and falls. He has occasional headaches and dizziness with position changes. Denies severe headaches, confusion, seizures, double vision, and blurred vision, nausea and vomiting. No reports of GI problems such as nausea, vomiting, diarrhea, and constipation. He has no reports of blood in stools, dysuria and hematuria. No depression or anxiety, and denies suicidal ideations, homicidal ideations, or auditory hallucinations. He denies pain today.   No past surgical history on file.  Family History  Family history unknown: Yes    Social History   Socioeconomic History  . Marital status: Married    Spouse name: Not on file  . Number of children: Not on file  . Years of education: Not on file  . Highest education level: Not on file  Occupational History  . Not on file  Social Needs  . Financial resource strain: Not on file  . Food insecurity:    Worry: Not on file    Inability: Not on file  . Transportation needs:    Medical: Not on file    Non-medical: Not on file  Tobacco Use  . Smoking status: Current Every Day Smoker  . Smokeless tobacco: Never Used  Substance and Sexual Activity  . Alcohol use: No  . Drug use: No  . Sexual  activity: Not on file  Lifestyle  . Physical activity:    Days per week: Not on file    Minutes per session: Not on file  . Stress: Not on file  Relationships  . Social connections:    Talks on phone: Not on file    Gets together: Not on file    Attends religious service: Not on file    Active member of club or organization: Not on file    Attends meetings of clubs or organizations: Not on file    Relationship status: Not on file  . Intimate partner violence:    Fear of current or ex partner: Not on file    Emotionally abused: Not on file    Physically abused: Not on file    Forced sexual activity: Not on file  Other Topics Concern  . Not on file  Social History Narrative  . Not on file    Outpatient Medications Prior to Visit  Medication Sig Dispense Refill  . amLODipine (NORVASC) 10 MG tablet Take 1 tablet (10 mg total) by mouth daily. 90 tablet 0  . aspirin EC 81 MG tablet Take 1 tablet (81 mg total) by mouth daily. 90 tablet 2  . carvedilol (COREG) 12.5 MG tablet Take 1 tablet (12.5 mg total) by mouth 2 (two) times daily with a meal. 60 tablet 0  . cloNIDine (CATAPRES) 0.3 MG tablet Take 1 tablet (0.3 mg  total) by mouth 3 (three) times daily. 90 tablet 0  . hydrALAZINE (APRESOLINE) 100 MG tablet Take 1 tablet (100 mg total) by mouth every 8 (eight) hours. 90 tablet 0  . spironolactone (ALDACTONE) 25 MG tablet Take 1 tablet (25 mg total) by mouth daily. 30 tablet 0   No facility-administered medications prior to visit.     No Known Allergies  ROS Review of Systems  Constitutional: Negative.   HENT: Negative.   Eyes: Negative.   Respiratory: Negative.   Cardiovascular: Negative.   Gastrointestinal: Negative.   Endocrine: Negative.   Genitourinary: Negative.   Musculoskeletal: Negative.   Skin: Negative.   Allergic/Immunologic: Negative.   Neurological: Positive for dizziness and headaches.  Hematological: Negative.   Psychiatric/Behavioral: Negative.         Objective:    Physical Exam  Constitutional: He is oriented to person, place, and time. He appears well-developed and well-nourished.  HENT:  Head: Normocephalic and atraumatic.  Eyes: Conjunctivae are normal.  Neck: Normal range of motion. Neck supple.  Cardiovascular: Normal rate, regular rhythm, normal heart sounds and intact distal pulses.  Pulmonary/Chest: Effort normal and breath sounds normal.  Abdominal: Soft. Bowel sounds are normal.  Musculoskeletal: Normal range of motion.  Neurological: He is alert and oriented to person, place, and time. He has normal reflexes.  Skin: Skin is warm and dry.  Psychiatric: He has a normal mood and affect. His behavior is normal. Judgment and thought content normal.   BP (!) 162/80   Pulse 64   Ht 5\' 7"  (1.702 m)   Wt 221 lb (100.2 kg)   SpO2 100%   BMI 34.61 kg/m  Wt Readings from Last 3 Encounters:  03/16/18 221 lb (100.2 kg)  03/07/18 221 lb (100.2 kg)  03/03/18 208 lb 15.9 oz (94.8 kg)     Health Maintenance Due  Topic Date Due  . INFLUENZA VACCINE  08/10/2017    There are no preventive care reminders to display for this patient.  Lab Results  Component Value Date   TSH 3.071 02/27/2018   Lab Results  Component Value Date   WBC 4.7 03/07/2018   HGB 12.4 (L) 03/07/2018   HCT 37.5 03/07/2018   MCV 85 03/07/2018   PLT 299 03/07/2018   Lab Results  Component Value Date   NA 134 03/07/2018   K 4.7 03/07/2018   CO2 21 03/07/2018   GLUCOSE 102 (H) 03/07/2018   BUN 43 (H) 03/07/2018   CREATININE 4.30 (H) 03/07/2018   BILITOT <0.2 03/07/2018   ALKPHOS 76 03/07/2018   AST 10 03/07/2018   ALT 8 03/07/2018   PROT 6.6 03/07/2018   ALBUMIN 4.4 03/07/2018   CALCIUM 9.4 03/07/2018   ANIONGAP 10 03/02/2018   Lab Results  Component Value Date   CHOL 200 (H) 03/07/2018   Lab Results  Component Value Date   HDL 53 03/07/2018   Lab Results  Component Value Date   LDLCALC 135 (H) 03/07/2018   Lab Results   Component Value Date   TRIG 61 03/07/2018   Lab Results  Component Value Date   CHOLHDL 3.8 03/07/2018   Lab Results  Component Value Date   HGBA1C 5.2 03/07/2018      Assessment & Plan:   1. Essential hypertension Blood pressure is elevated today. Clonidine given today in office. Stabilized today. He will report to ED if he eperiences severe headaches, confusion, seizures, double vision, and blurred vision, nausea and vomiting. - cloNIDine (CATAPRES) tablet  0.2 mg  2. Vitamin D deficiency - calcium-vitamin D (OSCAL WITH D) 500-200 MG-UNIT tablet; Take 1 tablet by mouth daily with breakfast.  Dispense: 30 tablet; Refill: 3  3. Follow up She will follow up in 2 months.  Meds ordered this encounter  Medications  . calcium-vitamin D (OSCAL WITH D) 500-200 MG-UNIT tablet    Sig: Take 1 tablet by mouth daily with breakfast.    Dispense:  30 tablet    Refill:  3  . cloNIDine (CATAPRES) tablet 0.2 mg    No orders of the defined types were placed in this encounter.   Referral Orders  No referral(s) requested today    Kathe Becton,  MSN, FNP-C [facility] [place], [place] [address] [phone]   Problem List Items Addressed This Visit    None    Visit Diagnoses    Essential hypertension    -  Primary   Relevant Medications   cloNIDine (CATAPRES) tablet 0.2 mg (Completed)   Vitamin D deficiency       Relevant Medications   calcium-vitamin D (OSCAL WITH D) 500-200 MG-UNIT tablet   Follow up          Meds ordered this encounter  Medications  . calcium-vitamin D (OSCAL WITH D) 500-200 MG-UNIT tablet    Sig: Take 1 tablet by mouth daily with breakfast.    Dispense:  30 tablet    Refill:  3  . cloNIDine (CATAPRES) tablet 0.2 mg    Follow-up: Return in about 2 months (around 05/16/2018).    Azzie Glatter, FNP

## 2018-03-22 ENCOUNTER — Inpatient Hospital Stay: Payer: Self-pay | Admitting: Family Medicine

## 2018-04-06 ENCOUNTER — Ambulatory Visit: Payer: Self-pay | Admitting: Family Medicine

## 2018-05-16 ENCOUNTER — Ambulatory Visit: Payer: Self-pay | Admitting: Family Medicine

## 2018-05-29 ENCOUNTER — Telehealth: Payer: Self-pay

## 2018-05-29 ENCOUNTER — Other Ambulatory Visit: Payer: Self-pay

## 2018-05-29 MED ORDER — SPIRONOLACTONE 25 MG PO TABS: 25.0000 mg | ORAL_TABLET | Freq: Every day | ORAL | 0 refills | Status: DC

## 2018-05-29 MED ORDER — CARVEDILOL 12.5 MG PO TABS: 12.5000 mg | ORAL_TABLET | Freq: Two times a day (BID) | ORAL | 0 refills | Status: DC

## 2018-05-29 MED ORDER — AMLODIPINE BESYLATE 10 MG PO TABS: 10.0000 mg | ORAL_TABLET | Freq: Every day | ORAL | 0 refills | Status: DC

## 2018-05-29 NOTE — Telephone Encounter (Signed)
Patient given a 30 day supply of medication. He is coming in for a office visit on 5/22

## 2018-06-01 ENCOUNTER — Ambulatory Visit: Payer: Self-pay | Admitting: Family Medicine

## 2018-06-05 ENCOUNTER — Ambulatory Visit: Payer: Self-pay | Admitting: Family Medicine

## 2018-06-11 ENCOUNTER — Telehealth: Payer: Self-pay

## 2018-06-11 NOTE — Telephone Encounter (Signed)
Patient was negative for the Cov-19 screening. Patient will be coming into office.

## 2018-06-12 ENCOUNTER — Other Ambulatory Visit: Payer: Self-pay

## 2018-06-12 ENCOUNTER — Ambulatory Visit (INDEPENDENT_AMBULATORY_CARE_PROVIDER_SITE_OTHER): Payer: Self-pay | Admitting: Family Medicine

## 2018-06-12 ENCOUNTER — Encounter: Payer: Self-pay | Admitting: Family Medicine

## 2018-06-12 VITALS — BP 172/112 | HR 70 | Temp 98.4°F | Ht 67.0 in | Wt 229.0 lb

## 2018-06-12 DIAGNOSIS — I10 Essential (primary) hypertension: Secondary | ICD-10-CM

## 2018-06-12 DIAGNOSIS — I160 Hypertensive urgency: Secondary | ICD-10-CM

## 2018-06-12 DIAGNOSIS — Z09 Encounter for follow-up examination after completed treatment for conditions other than malignant neoplasm: Secondary | ICD-10-CM

## 2018-06-12 LAB — POCT URINALYSIS DIP (MANUAL ENTRY)
Bilirubin, UA: NEGATIVE
Glucose, UA: NEGATIVE mg/dL
Ketones, POC UA: NEGATIVE mg/dL
Leukocytes, UA: NEGATIVE
Nitrite, UA: NEGATIVE
Protein Ur, POC: 100 mg/dL — AB
Spec Grav, UA: 1.02 (ref 1.010–1.025)
Urobilinogen, UA: 0.2 E.U./dL
pH, UA: 5.5 (ref 5.0–8.0)

## 2018-06-12 MED ORDER — AMLODIPINE BESYLATE 10 MG PO TABS: 10.0000 mg | ORAL_TABLET | Freq: Every day | ORAL | 1 refills | Status: DC

## 2018-06-12 MED ORDER — CLONIDINE HCL 0.3 MG PO TABS: 0.3000 mg | ORAL_TABLET | Freq: Three times a day (TID) | ORAL | 1 refills | Status: DC

## 2018-06-12 MED ORDER — CARVEDILOL 12.5 MG PO TABS: 12.5000 mg | ORAL_TABLET | Freq: Two times a day (BID) | ORAL | 0 refills | Status: DC

## 2018-06-12 MED ORDER — HYDRALAZINE HCL 100 MG PO TABS: 100.0000 mg | ORAL_TABLET | Freq: Three times a day (TID) | ORAL | 0 refills | Status: DC

## 2018-06-12 MED ORDER — SPIRONOLACTONE 25 MG PO TABS: 25.0000 mg | ORAL_TABLET | Freq: Every day | ORAL | 1 refills | Status: DC

## 2018-06-12 MED ORDER — CLONIDINE HCL 0.1 MG PO TABS
0.2000 mg | ORAL_TABLET | Freq: Once | ORAL | Status: AC
  Administered 2018-06-12: 0.2 mg via ORAL

## 2018-06-12 NOTE — Progress Notes (Signed)
Patient [name]   Established Patient Office Visit  Subjective:  Patient ID: David [name], male    DOB: [birth year]  Age: 48 y.o. MRN: [id number]  CC:  Chief Complaint  Patient presents with  . Follow-up    chronic condition     HPI David [name] is a 48 year old male who presents for follow up today.   Past Medical History:  Diagnosis Date  . Hypertension   . Vitamin D deficiency    Current Status: Since his last office visit, he is doing well with no complaints. He has not been taking Clonidine and Hydralazine since 02/2018. He states that he has been taking all other Hypertensive medications as prescribed. He denies visual changes, chest pain, cough, shortness of breath, heart palpitations, and falls. He has occasional headaches and dizziness with position changes. Denies severe headaches, confusion, seizures, double vision, and blurred vision, nausea and vomiting.  He denies fevers, chills, fatigue, recent infections, weight loss, and night sweats. No reports of GI problems such as diarrhea, and constipation. He has no reports of blood in stools, dysuria and hematuria. No depression or anxiety reported. He denies pain today.   History reviewed. No pertinent surgical history.  Family History  Family history unknown: Yes    Social History   Socioeconomic History  . Marital status: Married    Spouse name: Not on file  . Number of children: Not on file  . Years of education: Not on file  . Highest education level: Not on file  Occupational History  . Not on file  Social Needs  . Financial resource strain: Not on file  . Food insecurity:    Worry: Not on file    Inability: Not on file  . Transportation needs:    Medical: Not on file    Non-medical: Not on file  Tobacco Use  . Smoking status: Current Every Day Smoker  . Smokeless tobacco: Never Used  Substance and Sexual Activity  . Alcohol use: No  . Drug use: No  .  Sexual activity: Not on file  Lifestyle  . Physical activity:    Days per week: Not on file    Minutes per session: Not on file  . Stress: Not on file  Relationships  . Social connections:    Talks on phone: Not on file    Gets together: Not on file    Attends religious service: Not on file    Active member of club or organization: Not on file    Attends meetings of clubs or organizations: Not on file    Relationship status: Not on file  . Intimate partner violence:    Fear of current or ex partner: Not on file    Emotionally abused: Not on file    Physically abused: Not on file    Forced sexual activity: Not on file  Other Topics Concern  . Not on file  Social History Narrative  . Not on file    Outpatient Medications Prior to Visit  Medication Sig Dispense Refill  . aspirin EC 81 MG tablet Take 1 tablet (81 mg total) by mouth daily. 90 tablet 2  . calcium-vitamin D (OSCAL WITH D) 500-200 MG-UNIT tablet Take 1 tablet by mouth daily with breakfast. 30 tablet 3  . amLODipine (NORVASC) 10 MG tablet Take 1 tablet (10 mg total) by mouth daily. 30 tablet 0  . carvedilol (COREG) 12.5 MG tablet Take 1 tablet (  12.5 mg total) by mouth 2 (two) times daily with a meal. 60 tablet 0  . spironolactone (ALDACTONE) 25 MG tablet Take 1 tablet (25 mg total) by mouth daily. 30 tablet 0  . cloNIDine (CATAPRES) 0.3 MG tablet Take 1 tablet (0.3 mg total) by mouth 3 (three) times daily. (Patient not taking: Reported on 06/12/2018) 90 tablet 0  . hydrALAZINE (APRESOLINE) 100 MG tablet Take 1 tablet (100 mg total) by mouth every 8 (eight) hours. (Patient not taking: Reported on 06/12/2018) 90 tablet 0   No facility-administered medications prior to visit.     No Known Allergies  ROS Review of Systems  Constitutional: Negative.   HENT: Negative.   Eyes: Negative.   Respiratory: Negative.   Cardiovascular: Negative.   Gastrointestinal: Negative.   Endocrine: Negative.   Genitourinary: Negative.    Musculoskeletal: Negative.   Skin: Negative.   Allergic/Immunologic: Negative.   Neurological: Positive for dizziness (Occasional) and headaches (Occasional).  Hematological: Negative.   Psychiatric/Behavioral: Negative.       Objective:    Physical Exam  Constitutional: He is oriented to person, place, and time. He appears well-developed and well-nourished.  HENT:  Head: Normocephalic and atraumatic.  Eyes: Conjunctivae are normal.  Neck: Normal range of motion. Neck supple.  Cardiovascular: Normal rate, regular rhythm, normal heart sounds and intact distal pulses.  Pulmonary/Chest: Effort normal and breath sounds normal.  Abdominal: Soft. Bowel sounds are normal.  Musculoskeletal: Normal range of motion.  Neurological: He is alert and oriented to person, place, and time. He has normal reflexes.  Skin: Skin is warm and dry.  Psychiatric: He has a normal mood and affect. His behavior is normal. Judgment and thought content normal.  Nursing note and vitals reviewed.   BP (!) 172/112   Pulse 70   Temp 98.4 F (36.9 C) (Oral)   Ht 5\' 7"  (1.702 m)   Wt 229 lb (103.9 kg)   SpO2 100%   BMI 35.87 kg/m  Wt Readings from Last 3 Encounters:  06/12/18 229 lb (103.9 kg)  03/16/18 221 lb (100.2 kg)  03/07/18 221 lb (100.2 kg)     There are no preventive care reminders to display for this patient.  There are no preventive care reminders to display for this patient.  Lab Results  Component Value Date   TSH 3.071 02/27/2018   Lab Results  Component Value Date   WBC 4.7 03/07/2018   HGB 12.4 (L) 03/07/2018   HCT 37.5 03/07/2018   MCV 85 03/07/2018   PLT 299 03/07/2018   Lab Results  Component Value Date   NA 134 03/07/2018   K 4.7 03/07/2018   CO2 21 03/07/2018   GLUCOSE 102 (H) 03/07/2018   BUN 43 (H) 03/07/2018   CREATININE 4.30 (H) 03/07/2018   BILITOT <0.2 03/07/2018   ALKPHOS 76 03/07/2018   AST 10 03/07/2018   ALT 8 03/07/2018   PROT 6.6 03/07/2018    ALBUMIN 4.4 03/07/2018   CALCIUM 9.4 03/07/2018   ANIONGAP 10 03/02/2018   Lab Results  Component Value Date   CHOL 200 (H) 03/07/2018   Lab Results  Component Value Date   HDL 53 03/07/2018   Lab Results  Component Value Date   LDLCALC 135 (H) 03/07/2018   Lab Results  Component Value Date   TRIG 61 03/07/2018   Lab Results  Component Value Date   CHOLHDL 3.8 03/07/2018   Lab Results  Component Value Date   HGBA1C 5.2 03/07/2018  Assessment & Plan:   1. Hypertensive urgency  2. Essential hypertension Blood pressures are elevated today. Clonidine 0.3 mg given to patient in office and blood pressures remain elevated. We referred him to ED via ambulance at this time. Patient refused and signed AMA form at discharge. He denies severe headaches, confusion, seizures, double vision, and blurred vision, nausea and vomiting. He will report to ED if he experiences these symptoms. Patient verbalized understanding. He will continue resume taking Clonidine and Hydralazine as well as all other hypertensive medications as prescribed.  - POCT urinalysis dipstick - cloNIDine (CATAPRES) tablet 0.2 mg - amLODipine (NORVASC) 10 MG tablet; Take 1 tablet (10 mg total) by mouth daily. (Patient not taking: Reported on 06/12/2018)  Dispense: 30 tablet; Refill: 1 - carvedilol (COREG) 12.5 MG tablet; Take 1 tablet (12.5 mg total) by mouth 2 (two) times daily with a meal. (Patient not taking: Reported on 06/12/2018)  Dispense: 60 tablet; Refill: 0 - spironolactone (ALDACTONE) 25 MG tablet; Take 1 tablet (25 mg total) by mouth daily. (Patient not taking: Reported on 06/12/2018)  Dispense: 30 tablet; Refill: 1 - cloNIDine (CATAPRES) 0.3 MG tablet; Take 1 tablet (0.3 mg total) by mouth 3 (three) times daily. (Patient not taking: Reported on 06/12/2018)  Dispense: 90 tablet; Refill: 1 - hydrALAZINE (APRESOLINE) 100 MG tablet; Take 1 tablet (100 mg total) by mouth every 8 (eight) hours. (Patient not taking:  Reported on 06/12/2018)  Dispense: 90 tablet; Refill: 0  3. Follow up He will follow up in 1 months.   Meds ordered this encounter  Medications  . cloNIDine (CATAPRES) tablet 0.2 mg  . amLODipine (NORVASC) 10 MG tablet    Sig: Take 1 tablet (10 mg total) by mouth daily.    Dispense:  30 tablet    Refill:  1  . carvedilol (COREG) 12.5 MG tablet    Sig: Take 1 tablet (12.5 mg total) by mouth 2 (two) times daily with a meal.    Dispense:  60 tablet    Refill:  0  . spironolactone (ALDACTONE) 25 MG tablet    Sig: Take 1 tablet (25 mg total) by mouth daily.    Dispense:  30 tablet    Refill:  1  . cloNIDine (CATAPRES) 0.3 MG tablet    Sig: Take 1 tablet (0.3 mg total) by mouth 3 (three) times daily.    Dispense:  90 tablet    Refill:  1  . hydrALAZINE (APRESOLINE) 100 MG tablet    Sig: Take 1 tablet (100 mg total) by mouth every 8 (eight) hours.    Dispense:  90 tablet    Refill:  0    Orders Placed This Encounter  Procedures  . POCT urinalysis dipstick    Referral Orders  No referral(s) requested today    Kathe Becton,  MSN, FNP-BC Patient [facility], [place] [phone]   Problem List Items Addressed This Visit      Cardiovascular and Mediastinum   Hypertensive emergency   Relevant Medications   cloNIDine (CATAPRES) tablet 0.2 mg (Completed)   amLODipine (NORVASC) 10 MG tablet   carvedilol (COREG) 12.5 MG tablet   spironolactone (ALDACTONE) 25 MG tablet   cloNIDine (CATAPRES) 0.3 MG tablet   hydrALAZINE (APRESOLINE) 100 MG tablet    Other Visit Diagnoses    Essential hypertension    -  Primary   Relevant Medications   cloNIDine (CATAPRES) tablet 0.2 mg (Completed)  amLODipine (NORVASC) 10 MG tablet   carvedilol (COREG) 12.5 MG tablet   spironolactone (ALDACTONE) 25 MG tablet   cloNIDine (CATAPRES) 0.3 MG tablet   hydrALAZINE (APRESOLINE) 100 MG tablet   Other Relevant Orders   POCT  urinalysis dipstick (Completed)   Follow up          Meds ordered this encounter  Medications  . cloNIDine (CATAPRES) tablet 0.2 mg  . amLODipine (NORVASC) 10 MG tablet    Sig: Take 1 tablet (10 mg total) by mouth daily.    Dispense:  30 tablet    Refill:  1  . carvedilol (COREG) 12.5 MG tablet    Sig: Take 1 tablet (12.5 mg total) by mouth 2 (two) times daily with a meal.    Dispense:  60 tablet    Refill:  0  . spironolactone (ALDACTONE) 25 MG tablet    Sig: Take 1 tablet (25 mg total) by mouth daily.    Dispense:  30 tablet    Refill:  1  . cloNIDine (CATAPRES) 0.3 MG tablet    Sig: Take 1 tablet (0.3 mg total) by mouth 3 (three) times daily.    Dispense:  90 tablet    Refill:  1  . hydrALAZINE (APRESOLINE) 100 MG tablet    Sig: Take 1 tablet (100 mg total) by mouth every 8 (eight) hours.    Dispense:  90 tablet    Refill:  0    Follow-up: Return in about 1 month (around 07/12/2018).    Azzie Glatter, FNP

## 2018-07-02 ENCOUNTER — Other Ambulatory Visit: Payer: Self-pay | Admitting: Family Medicine

## 2018-07-02 DIAGNOSIS — I10 Essential (primary) hypertension: Secondary | ICD-10-CM

## 2018-07-04 ENCOUNTER — Other Ambulatory Visit: Payer: Self-pay | Admitting: Family Medicine

## 2018-07-04 DIAGNOSIS — I10 Essential (primary) hypertension: Secondary | ICD-10-CM

## 2018-07-10 ENCOUNTER — Other Ambulatory Visit: Payer: Self-pay

## 2018-07-10 ENCOUNTER — Telehealth: Payer: Self-pay

## 2018-07-10 NOTE — Telephone Encounter (Signed)
Patient is negative for screening for and will becoming in the office

## 2018-07-11 ENCOUNTER — Other Ambulatory Visit: Payer: Self-pay

## 2018-07-11 ENCOUNTER — Encounter: Payer: Self-pay | Admitting: Family Medicine

## 2018-07-11 ENCOUNTER — Ambulatory Visit (INDEPENDENT_AMBULATORY_CARE_PROVIDER_SITE_OTHER): Payer: Self-pay | Admitting: Family Medicine

## 2018-07-11 VITALS — BP 142/86 | HR 72 | Temp 98.0°F | Ht 67.0 in | Wt 234.0 lb

## 2018-07-11 DIAGNOSIS — I10 Essential (primary) hypertension: Secondary | ICD-10-CM

## 2018-07-11 DIAGNOSIS — I160 Hypertensive urgency: Secondary | ICD-10-CM

## 2018-07-11 DIAGNOSIS — Z09 Encounter for follow-up examination after completed treatment for conditions other than malignant neoplasm: Secondary | ICD-10-CM

## 2018-07-11 LAB — POCT URINALYSIS DIP (MANUAL ENTRY)
Bilirubin, UA: NEGATIVE
Blood, UA: NEGATIVE
Glucose, UA: NEGATIVE mg/dL
Leukocytes, UA: NEGATIVE
Nitrite, UA: NEGATIVE
Protein Ur, POC: >=300 mg/dL — AB
Spec Grav, UA: 1.025 (ref 1.010–1.025)
Urobilinogen, UA: 0.2 E.U./dL
pH, UA: 5.5 (ref 5.0–8.0)

## 2018-07-11 NOTE — Progress Notes (Signed)
Patient [facility]   Established Patient Office Visit  Subjective:  Patient ID: David [name], male    DOB: November 03, 1970  Age: 48 y.o. MRN: [id number]  CC:  Chief Complaint  Patient presents with  . Follow-up    HTN     HPI David [name] is a 48 year old male who presents for follow up today.   Past Medical History:  Diagnosis Date  . Hypertension   . Vitamin D deficiency    Current Status: Since his last office visit, he is doing well with no complaints. He denies visual changes, chest pain, cough, shortness of breath, heart palpitations, and falls. He has occasional headaches and dizziness with position changes. Denies severe headaches, confusion, seizures, double vision, and blurred vision, nausea and vomiting.  He denies fevers, chills, fatigue, recent infections, weight loss, and night sweats. No reports of GI problems such as diarrhea, and constipation. He has no reports of blood in stools, dysuria and hematuria. No depression or anxiety, and denies suicidal ideations, homicidal ideations, or auditory hallucinations. He denies pain today.   History reviewed. No pertinent surgical history.  Family History  Family history unknown: Yes    Social History   Socioeconomic History  . Marital status: Married    Spouse name: Not on file  . Number of children: Not on file  . Years of education: Not on file  . Highest education level: Not on file  Occupational History  . Not on file  Social Needs  . Financial resource strain: Not on file  . Food insecurity    Worry: Not on file    Inability: Not on file  . Transportation needs    Medical: Not on file    Non-medical: Not on file  Tobacco Use  . Smoking status: Former Research scientist (life sciences)  . Smokeless tobacco: Never Used  Substance and Sexual Activity  . Alcohol use: No  . Drug use: No  . Sexual activity: Not on file  Lifestyle  . Physical activity    Days per week: Not on file    Minutes per  session: Not on file  . Stress: Not on file  Relationships  . Social Herbalist on phone: Not on file    Gets together: Not on file    Attends religious service: Not on file    Active member of club or organization: Not on file    Attends meetings of clubs or organizations: Not on file    Relationship status: Not on file  . Intimate partner violence    Fear of current or ex partner: Not on file    Emotionally abused: Not on file    Physically abused: Not on file    Forced sexual activity: Not on file  Other Topics Concern  . Not on file  Social History Narrative  . Not on file    Outpatient Medications Prior to Visit  Medication Sig Dispense Refill  . amLODipine (NORVASC) 10 MG tablet Take 1 tablet (10 mg total) by mouth daily. 30 tablet 1  . aspirin EC 81 MG tablet Take 1 tablet (81 mg total) by mouth daily. 90 tablet 2  . calcium-vitamin D (OSCAL WITH D) 500-200 MG-UNIT tablet Take 1 tablet by mouth daily with breakfast. 30 tablet 3  . carvedilol (COREG) 25 MG tablet Take 25 mg by mouth 2 (two) times daily.    . cloNIDine (CATAPRES) 0.3 MG tablet Take 1 tablet (0.3  mg total) by mouth 3 (three) times daily. 90 tablet 1  . hydrALAZINE (APRESOLINE) 100 MG tablet Take 1 tablet (100 mg total) by mouth every 8 (eight) hours. 90 tablet 0  . spironolactone (ALDACTONE) 25 MG tablet Take 1 tablet (25 mg total) by mouth daily. 30 tablet 1  . carvedilol (COREG) 12.5 MG tablet Take 1 tablet (12.5 mg total) by mouth 2 (two) times daily with a meal. (Patient not taking: Reported on 07/11/2018) 60 tablet 0   No facility-administered medications prior to visit.     No Known Allergies  ROS Review of Systems  Constitutional: Negative.   HENT: Negative.   Eyes: Negative.   Respiratory: Negative.   Cardiovascular: Negative.   Gastrointestinal: Negative.   Endocrine: Negative.   Genitourinary: Negative.   Musculoskeletal: Negative.   Skin: Negative.   Allergic/Immunologic:  Negative.   Neurological: Positive for dizziness (occasional) and headaches (occasional ).  Hematological: Negative.   Psychiatric/Behavioral: Negative.    Objective:    Physical Exam  Constitutional: He is oriented to person, place, and time. He appears well-developed and well-nourished.  HENT:  Head: Normocephalic and atraumatic.  Eyes: Conjunctivae are normal.  Neck: Normal range of motion. Neck supple.  Cardiovascular: Normal rate, regular rhythm, normal heart sounds and intact distal pulses.  Pulmonary/Chest: Effort normal and breath sounds normal.  Abdominal: Soft. Bowel sounds are normal.  Musculoskeletal: Normal range of motion.  Neurological: He is alert and oriented to person, place, and time. He has normal reflexes.  Skin: Skin is warm and dry.  Psychiatric: He has a normal mood and affect. His behavior is normal. Thought content normal.    BP (!) 142/86   Pulse 72   Temp 98 F (36.7 C) (Oral)   Ht 5\' 7"  (1.702 m)   Wt 234 lb (106.1 kg)   SpO2 98%   BMI 36.65 kg/m  Wt Readings from Last 3 Encounters:  07/11/18 234 lb (106.1 kg)  06/12/18 229 lb (103.9 kg)  03/16/18 221 lb (100.2 kg)     There are no preventive care reminders to display for this patient.  There are no preventive care reminders to display for this patient.  Lab Results  Component Value Date   TSH 3.071 02/27/2018   Lab Results  Component Value Date   WBC 4.7 03/07/2018   HGB 12.4 (L) 03/07/2018   HCT 37.5 03/07/2018   MCV 85 03/07/2018   PLT 299 03/07/2018   Lab Results  Component Value Date   NA 134 03/07/2018   K 4.7 03/07/2018   CO2 21 03/07/2018   GLUCOSE 102 (H) 03/07/2018   BUN 43 (H) 03/07/2018   CREATININE 4.30 (H) 03/07/2018   BILITOT <0.2 03/07/2018   ALKPHOS 76 03/07/2018   AST 10 03/07/2018   ALT 8 03/07/2018   PROT 6.6 03/07/2018   ALBUMIN 4.4 03/07/2018   CALCIUM 9.4 03/07/2018   ANIONGAP 10 03/02/2018   Lab Results  Component Value Date   CHOL 200 (H)  03/07/2018   Lab Results  Component Value Date   HDL 53 03/07/2018   Lab Results  Component Value Date   LDLCALC 135 (H) 03/07/2018   Lab Results  Component Value Date   TRIG 61 03/07/2018   Lab Results  Component Value Date   CHOLHDL 3.8 03/07/2018   Lab Results  Component Value Date   HGBA1C 5.2 03/07/2018      Assessment & Plan:   1. Essential hypertension The current medical regimen  is effective; blood pressure is stable at 142/86 today; continue present plan and medications as prescribed. She will continue to decrease high sodium intake, excessive alcohol intake, increase potassium intake, smoking cessation, and increase physical activity of at least 30 minutes of cardio activity daily.He will continue to follow Heart Healthy or DASH diet.  - POCT urinalysis dipstick  2. Hypertensive urgency Stable today.   3. Follow up He will follow up in 3 months.   No orders of the defined types were placed in this encounter.   Orders Placed This Encounter  Procedures  . POCT urinalysis dipstick    Referral Orders  No referral(s) requested today    Kathe Becton,  MSN, FNP-BC [facility], [place] [phone]    Problem List Items Addressed This Visit    None    Visit Diagnoses    Essential hypertension    -  Primary   Relevant Medications   carvedilol (COREG) 25 MG tablet   Other Relevant Orders   POCT urinalysis dipstick (Completed)   Hypertensive urgency       Relevant Medications   carvedilol (COREG) 25 MG tablet   Follow up          No orders of the defined types were placed in this encounter.   Follow-up: Return in about 3 months (around 10/11/2018).    Azzie Glatter, FNP

## 2018-07-11 NOTE — Progress Notes (Signed)
000000

## 2018-08-07 ENCOUNTER — Other Ambulatory Visit: Payer: Self-pay | Admitting: Family Medicine

## 2018-08-07 DIAGNOSIS — I10 Essential (primary) hypertension: Secondary | ICD-10-CM

## 2018-08-26 ENCOUNTER — Other Ambulatory Visit: Payer: Self-pay | Admitting: Family Medicine

## 2018-08-26 DIAGNOSIS — I10 Essential (primary) hypertension: Secondary | ICD-10-CM

## 2018-09-06 ENCOUNTER — Other Ambulatory Visit: Payer: Self-pay | Admitting: Family Medicine

## 2018-09-06 DIAGNOSIS — I10 Essential (primary) hypertension: Secondary | ICD-10-CM

## 2018-10-12 ENCOUNTER — Ambulatory Visit (INDEPENDENT_AMBULATORY_CARE_PROVIDER_SITE_OTHER): Payer: Self-pay | Admitting: Family Medicine

## 2018-10-12 ENCOUNTER — Encounter: Payer: Self-pay | Admitting: Family Medicine

## 2018-10-12 ENCOUNTER — Other Ambulatory Visit: Payer: Self-pay | Admitting: Family Medicine

## 2018-10-12 ENCOUNTER — Other Ambulatory Visit: Payer: Self-pay

## 2018-10-12 VITALS — BP 144/102 | HR 57 | Ht 71.0 in | Wt 202.8 lb

## 2018-10-12 DIAGNOSIS — R7989 Other specified abnormal findings of blood chemistry: Secondary | ICD-10-CM

## 2018-10-12 DIAGNOSIS — I10 Essential (primary) hypertension: Secondary | ICD-10-CM

## 2018-10-12 DIAGNOSIS — I160 Hypertensive urgency: Secondary | ICD-10-CM

## 2018-10-12 DIAGNOSIS — Z9114 Patient's other noncompliance with medication regimen: Secondary | ICD-10-CM

## 2018-10-12 DIAGNOSIS — Z09 Encounter for follow-up examination after completed treatment for conditions other than malignant neoplasm: Secondary | ICD-10-CM

## 2018-10-12 DIAGNOSIS — R809 Proteinuria, unspecified: Secondary | ICD-10-CM

## 2018-10-12 DIAGNOSIS — Z91148 Patient's other noncompliance with medication regimen for other reason: Secondary | ICD-10-CM

## 2018-10-12 DIAGNOSIS — Z23 Encounter for immunization: Secondary | ICD-10-CM

## 2018-10-12 LAB — POCT URINALYSIS DIPSTICK
Bilirubin, UA: NEGATIVE
Blood, UA: NEGATIVE
Glucose, UA: NEGATIVE
Ketones, UA: NEGATIVE
Leukocytes, UA: NEGATIVE
Nitrite, UA: NEGATIVE
Protein, UA: POSITIVE — AB
Spec Grav, UA: 1.025 (ref 1.010–1.025)
Urobilinogen, UA: 0.2 E.U./dL
pH, UA: 5.5 (ref 5.0–8.0)

## 2018-10-12 MED ORDER — CLONIDINE HCL 0.3 MG PO TABS: 0.3000 mg | ORAL_TABLET | Freq: Three times a day (TID) | ORAL | 3 refills | Status: DC

## 2018-10-12 MED ORDER — CARVEDILOL 25 MG PO TABS: 25.0000 mg | ORAL_TABLET | Freq: Two times a day (BID) | ORAL | 3 refills | Status: DC

## 2018-10-12 MED ORDER — AMLODIPINE BESYLATE 10 MG PO TABS: 10.0000 mg | ORAL_TABLET | Freq: Every day | ORAL | 3 refills | Status: DC

## 2018-10-12 MED ORDER — HYDRALAZINE HCL 100 MG PO TABS: 100.0000 mg | ORAL_TABLET | Freq: Three times a day (TID) | ORAL | 3 refills | Status: DC

## 2018-10-12 NOTE — Progress Notes (Signed)
Patient [facility]   Established Patient Office Visit  Subjective:  Patient ID: David [name], male    DOB: Jun 04, 1970  Age: 48 y.o. MRN: [id number]  CC:  Chief Complaint  Patient presents with  . Follow-up    3 mth follow up HTN    HPI David [name] is a 48 year old male who presents for follow up today.   Past Medical History:  Diagnosis Date  . Hypertension   . Vitamin D deficiency    Current Status: Since his last office visit, he is doing well with no complaints. His blood pressures are elevated today. He has not been taking his hypertensive medications as prescribed. He has follow up appointment with Nephrology in a few weeks. He denies fevers, chills, fatigue, recent infections, weight loss, and night sweats. No reports of GI problems such as diarrhea, and constipation. He has no reports of blood in stools, dysuria and hematuria. No depression or anxiety reported. He denies pain today.   No past surgical history on file.  Family History  Family history unknown: Yes    Social History   Socioeconomic History  . Marital status: Married    Spouse name: Not on file  . Number of children: Not on file  . Years of education: Not on file  . Highest education level: Not on file  Occupational History  . Not on file  Social Needs  . Financial resource strain: Not on file  . Food insecurity    Worry: Not on file    Inability: Not on file  . Transportation needs    Medical: Not on file    Non-medical: Not on file  Tobacco Use  . Smoking status: Former Research scientist (life sciences)  . Smokeless tobacco: Never Used  Substance and Sexual Activity  . Alcohol use: No  . Drug use: No  . Sexual activity: Not on file  Lifestyle  . Physical activity    Days per week: Not on file    Minutes per session: Not on file  . Stress: Not on file  Relationships  . Social Herbalist on phone: Not on file    Gets together: Not on file    Attends religious  service: Not on file    Active member of club or organization: Not on file    Attends meetings of clubs or organizations: Not on file    Relationship status: Not on file  . Intimate partner violence    Fear of current or ex partner: Not on file    Emotionally abused: Not on file    Physically abused: Not on file    Forced sexual activity: Not on file  Other Topics Concern  . Not on file  Social History Narrative  . Not on file    Outpatient Medications Prior to Visit  Medication Sig Dispense Refill  . aspirin EC 81 MG tablet Take 1 tablet (81 mg total) by mouth daily. 90 tablet 2  . calcium-vitamin D (OSCAL WITH D) 500-200 MG-UNIT tablet Take 1 tablet by mouth daily with breakfast. 30 tablet 3  . spironolactone (ALDACTONE) 25 MG tablet TAKE 1 TABLET BY MOUTH EVERY DAY 30 tablet 1  . amLODipine (NORVASC) 10 MG tablet TAKE 1 TABLET BY MOUTH EVERY DAY 30 tablet 1  . carvedilol (COREG) 12.5 MG tablet TAKE 1 TABLET BY MOUTH 2 TIMES DAILY WITH A MEAL. 60 tablet 0  . carvedilol (COREG) 25 MG tablet  Take 25 mg by mouth 2 (two) times daily.    . cloNIDine (CATAPRES) 0.3 MG tablet Take 1 tablet (0.3 mg total) by mouth 3 (three) times daily. 90 tablet 1  . hydrALAZINE (APRESOLINE) 100 MG tablet Take 1 tablet (100 mg total) by mouth every 8 (eight) hours. 90 tablet 0   No facility-administered medications prior to visit.     No Known Allergies  ROS Review of Systems  Constitutional: Negative.   HENT: Negative.   Eyes: Negative.   Respiratory: Negative.   Cardiovascular: Negative.   Gastrointestinal: Negative.   Endocrine: Negative.   Genitourinary: Negative.   Musculoskeletal: Positive for arthralgias (generalized. ).  Skin: Negative.   Allergic/Immunologic: Negative.   Neurological: Positive for dizziness (occasional) and headaches (occasional ).  Hematological: Negative.   Psychiatric/Behavioral: Negative.       Objective:    Physical Exam  Constitutional: He is oriented to  person, place, and time. He appears well-developed and well-nourished.  HENT:  Head: Normocephalic and atraumatic.  Eyes: Conjunctivae are normal.  Neck: Normal range of motion. Neck supple.  Cardiovascular: Normal rate, regular rhythm, normal heart sounds and intact distal pulses.  Pulmonary/Chest: Effort normal and breath sounds normal.  Abdominal: Soft. Bowel sounds are normal. He exhibits distension (obese).  Musculoskeletal: Normal range of motion.  Neurological: He is alert and oriented to person, place, and time. He has normal reflexes.  Skin: Skin is warm and dry.  Psychiatric: He has a normal mood and affect. His behavior is normal. Judgment and thought content normal.  Nursing note and vitals reviewed.   BP (!) 144/102 Comment: Patient signed AMA form after advised to go the ER  Pulse (!) 57   Ht 5\' 11"  (1.803 m)   Wt 202 lb 12.8 oz (92 kg)   SpO2 99%   BMI 28.28 kg/m  Wt Readings from Last 3 Encounters:  10/12/18 202 lb 12.8 oz (92 kg)  07/11/18 234 lb (106.1 kg)  06/12/18 229 lb (103.9 kg)     There are no preventive care reminders to display for this patient.  There are no preventive care reminders to display for this patient.  Lab Results  Component Value Date   TSH 3.071 02/27/2018   Lab Results  Component Value Date   WBC 4.7 03/07/2018   HGB 12.4 (L) 03/07/2018   HCT 37.5 03/07/2018   MCV 85 03/07/2018   PLT 299 03/07/2018   Lab Results  Component Value Date   NA 134 03/07/2018   K 4.7 03/07/2018   CO2 21 03/07/2018   GLUCOSE 102 (H) 03/07/2018   BUN 43 (H) 03/07/2018   CREATININE 4.30 (H) 03/07/2018   BILITOT <0.2 03/07/2018   ALKPHOS 76 03/07/2018   AST 10 03/07/2018   ALT 8 03/07/2018   PROT 6.6 03/07/2018   ALBUMIN 4.4 03/07/2018   CALCIUM 9.4 03/07/2018   ANIONGAP 10 03/02/2018   Lab Results  Component Value Date   CHOL 200 (H) 03/07/2018   Lab Results  Component Value Date   HDL 53 03/07/2018   Lab Results  Component Value  Date   LDLCALC 135 (H) 03/07/2018   Lab Results  Component Value Date   TRIG 61 03/07/2018   Lab Results  Component Value Date   CHOLHDL 3.8 03/07/2018   Lab Results  Component Value Date   HGBA1C 5.2 03/07/2018      Assessment & Plan:   1. Hypertensive urgency Blood pressures are elevated today. Clonidine 0.3 mg  given to patient in office and blood pressures remain elevated. We referred him to ED via ambulance at this time. Patient refused and signed AMA form at discharge. He denies severe headaches, confusion, seizures, double vision, and blurred vision, nausea and vomiting. He will report to ED if he experiences these symptoms. Patient verbalized understanding. He will continue to take medications as prescribed, to decrease high sodium intake, excessive alcohol intake, increase potassium intake, smoking cessation, and increase physical activity of at least 30 minutes of cardio activity daily. He will continue to follow Heart Healthy or DASH diet.  2. Essential hypertension - POCT urinalysis dipstick - carvedilol (COREG) 25 MG tablet; Take 1 tablet (25 mg total) by mouth 2 (two) times daily.  Dispense: 60 tablet; Refill: 3 - cloNIDine (CATAPRES) 0.3 MG tablet; Take 1 tablet (0.3 mg total) by mouth 3 (three) times daily.  Dispense: 90 tablet; Refill: 3 - hydrALAZINE (APRESOLINE) 100 MG tablet; Take 1 tablet (100 mg total) by mouth every 8 (eight) hours.  Dispense: 90 tablet; Refill: 3 - amLODipine (NORVASC) 10 MG tablet; Take 1 tablet (10 mg total) by mouth daily.  Dispense: 30 tablet; Refill: 3  3. History of medication noncompliance  4. Proteinuria, unspecified type - Microalbumin/Creatinine Ratio, Urine  5. Elevated serum creatinine He will continue to follow up with Nephrologist as needed.  - Microalbumin/Creatinine Ratio, Urine  6. Need for influenza vaccination - Flu Vaccine QUAD 36+ mos IM  7. Follow up He will follow up in 1 week for blood pressure check only.  He  will follow up in 1 month.    Meds ordered this encounter  Medications  . carvedilol (COREG) 25 MG tablet    Sig: Take 1 tablet (25 mg total) by mouth 2 (two) times daily.    Dispense:  60 tablet    Refill:  3  . cloNIDine (CATAPRES) 0.3 MG tablet    Sig: Take 1 tablet (0.3 mg total) by mouth 3 (three) times daily.    Dispense:  90 tablet    Refill:  3  . hydrALAZINE (APRESOLINE) 100 MG tablet    Sig: Take 1 tablet (100 mg total) by mouth every 8 (eight) hours.    Dispense:  90 tablet    Refill:  3  . amLODipine (NORVASC) 10 MG tablet    Sig: Take 1 tablet (10 mg total) by mouth daily.    Dispense:  30 tablet    Refill:  3    Orders Placed This Encounter  Procedures  . Flu Vaccine QUAD 36+ mos IM  . Microalbumin/Creatinine Ratio, Urine  . POCT urinalysis dipstick    Referral Orders  No referral(s) requested today    Kathe Becton,  MSN, FNP-BC [address], [place] [address] [phone] fax   Problem List Items Addressed This Visit    None    Visit Diagnoses    Hypertensive urgency    -  Primary   Relevant Medications   carvedilol (COREG) 25 MG tablet   cloNIDine (CATAPRES) 0.3 MG tablet   hydrALAZINE (APRESOLINE) 100 MG tablet   amLODipine (NORVASC) 10 MG tablet   Essential hypertension       Relevant Medications   carvedilol (COREG) 25 MG tablet   cloNIDine (CATAPRES) 0.3 MG tablet   hydrALAZINE (APRESOLINE) 100 MG tablet   amLODipine (NORVASC) 10 MG tablet   Other Relevant Orders   POCT urinalysis dipstick (  Completed)   History of medication noncompliance       Proteinuria, unspecified type       Relevant Orders   Microalbumin/Creatinine Ratio, Urine (Completed)   Elevated serum creatinine       Relevant Orders   Microalbumin/Creatinine Ratio, Urine (Completed)   Need for influenza vaccination       Relevant Orders   Flu Vaccine QUAD 36+ mos  IM (Completed)   Follow up          Meds ordered this encounter  Medications  . carvedilol (COREG) 25 MG tablet    Sig: Take 1 tablet (25 mg total) by mouth 2 (two) times daily.    Dispense:  60 tablet    Refill:  3  . cloNIDine (CATAPRES) 0.3 MG tablet    Sig: Take 1 tablet (0.3 mg total) by mouth 3 (three) times daily.    Dispense:  90 tablet    Refill:  3  . hydrALAZINE (APRESOLINE) 100 MG tablet    Sig: Take 1 tablet (100 mg total) by mouth every 8 (eight) hours.    Dispense:  90 tablet    Refill:  3  . amLODipine (NORVASC) 10 MG tablet    Sig: Take 1 tablet (10 mg total) by mouth daily.    Dispense:  30 tablet    Refill:  3    Follow-up: No follow-ups on file.    Azzie Glatter, FNP

## 2018-10-13 LAB — MICROALBUMIN / CREATININE URINE RATIO
Creatinine, Urine: 295.1 mg/dL
Microalb/Creat Ratio: 74 mg/g creat — ABNORMAL HIGH (ref 0–29)
Microalbumin, Urine: 217.9 ug/mL

## 2018-10-14 DIAGNOSIS — I1A0 Resistant hypertension: Secondary | ICD-10-CM | POA: Insufficient documentation

## 2018-10-14 DIAGNOSIS — Z9114 Patient's other noncompliance with medication regimen: Secondary | ICD-10-CM | POA: Insufficient documentation

## 2018-10-14 DIAGNOSIS — I160 Hypertensive urgency: Secondary | ICD-10-CM | POA: Insufficient documentation

## 2018-10-14 DIAGNOSIS — I10 Essential (primary) hypertension: Secondary | ICD-10-CM | POA: Insufficient documentation

## 2018-10-14 DIAGNOSIS — Z91148 Patient's other noncompliance with medication regimen for other reason: Secondary | ICD-10-CM | POA: Insufficient documentation

## 2018-10-15 ENCOUNTER — Telehealth: Payer: Self-pay

## 2018-10-15 NOTE — Telephone Encounter (Signed)
Called, no answer. Left a message to call back. Thanks!  

## 2018-10-15 NOTE — Telephone Encounter (Signed)
-----   Message from Azzie Glatter, [place] sent at 10/15/2018 10:24 AM EDT ----- He will continue to follow up with Nephrology. Please assess which office he has follow ups, and fax results. Thank you.

## 2018-10-19 ENCOUNTER — Ambulatory Visit (INDEPENDENT_AMBULATORY_CARE_PROVIDER_SITE_OTHER): Payer: Self-pay | Admitting: Family Medicine

## 2018-10-19 ENCOUNTER — Other Ambulatory Visit: Payer: Self-pay

## 2018-10-19 VITALS — BP 132/90

## 2018-10-19 DIAGNOSIS — Z0130 Encounter for examination of blood pressure without abnormal findings: Secondary | ICD-10-CM

## 2018-11-13 ENCOUNTER — Ambulatory Visit: Payer: Self-pay | Admitting: Family Medicine

## 2018-12-11 ENCOUNTER — Ambulatory Visit: Payer: Self-pay | Admitting: Family Medicine

## 2019-01-02 ENCOUNTER — Ambulatory Visit: Payer: Self-pay | Admitting: Family Medicine

## 2019-03-26 ENCOUNTER — Other Ambulatory Visit: Payer: Self-pay | Admitting: Family Medicine

## 2019-03-26 DIAGNOSIS — I10 Essential (primary) hypertension: Secondary | ICD-10-CM

## 2019-04-23 ENCOUNTER — Other Ambulatory Visit: Payer: Self-pay | Admitting: Family Medicine

## 2019-04-23 DIAGNOSIS — I10 Essential (primary) hypertension: Secondary | ICD-10-CM

## 2019-05-16 ENCOUNTER — Other Ambulatory Visit: Payer: Self-pay | Admitting: Family Medicine

## 2019-05-16 DIAGNOSIS — I10 Essential (primary) hypertension: Secondary | ICD-10-CM

## 2019-07-28 ENCOUNTER — Other Ambulatory Visit: Payer: Self-pay | Admitting: Family Medicine

## 2019-07-28 DIAGNOSIS — I10 Essential (primary) hypertension: Secondary | ICD-10-CM

## 2019-08-01 ENCOUNTER — Other Ambulatory Visit: Payer: Self-pay | Admitting: Family Medicine

## 2019-08-01 DIAGNOSIS — I10 Essential (primary) hypertension: Secondary | ICD-10-CM

## 2019-08-02 ENCOUNTER — Other Ambulatory Visit: Payer: Self-pay | Admitting: Family Medicine

## 2019-08-02 DIAGNOSIS — I10 Essential (primary) hypertension: Secondary | ICD-10-CM

## 2019-08-24 ENCOUNTER — Other Ambulatory Visit: Payer: Self-pay | Admitting: Family Medicine

## 2019-08-24 DIAGNOSIS — I10 Essential (primary) hypertension: Secondary | ICD-10-CM

## 2019-08-27 ENCOUNTER — Other Ambulatory Visit: Payer: Self-pay

## 2019-08-30 ENCOUNTER — Other Ambulatory Visit: Payer: Self-pay | Admitting: Family Medicine

## 2019-08-30 DIAGNOSIS — I10 Essential (primary) hypertension: Secondary | ICD-10-CM

## 2019-08-30 MED ORDER — SPIRONOLACTONE 25 MG PO TABS: 25.0000 mg | ORAL_TABLET | Freq: Every day | ORAL | 0 refills | Status: DC

## 2019-08-30 MED FILL — SPIRONOLACTONE 25 MG TABLET: 25 | 30 days supply | Qty: 30 | Fill #0

## 2019-09-20 ENCOUNTER — Other Ambulatory Visit: Payer: Self-pay | Admitting: Family Medicine

## 2019-09-20 DIAGNOSIS — I10 Essential (primary) hypertension: Secondary | ICD-10-CM

## 2019-09-27 ENCOUNTER — Other Ambulatory Visit: Payer: Self-pay | Admitting: Family Medicine

## 2019-09-27 DIAGNOSIS — I10 Essential (primary) hypertension: Secondary | ICD-10-CM

## 2019-09-30 ENCOUNTER — Other Ambulatory Visit: Payer: Self-pay | Admitting: Family Medicine

## 2019-09-30 DIAGNOSIS — I10 Essential (primary) hypertension: Secondary | ICD-10-CM

## 2019-10-08 ENCOUNTER — Ambulatory Visit: Payer: Self-pay | Admitting: Family Medicine

## 2019-10-23 ENCOUNTER — Ambulatory Visit: Payer: Self-pay | Admitting: Family Medicine

## 2019-11-11 DIAGNOSIS — I714 Abdominal aortic aneurysm, without rupture, unspecified: Secondary | ICD-10-CM

## 2019-11-11 HISTORY — DX: Abdominal aortic aneurysm, without rupture: I71.4

## 2019-11-11 HISTORY — DX: Abdominal aortic aneurysm, without rupture, unspecified: I71.40

## 2019-12-04 ENCOUNTER — Emergency Department (HOSPITAL_COMMUNITY): Payer: HRSA Program

## 2019-12-04 ENCOUNTER — Encounter (HOSPITAL_COMMUNITY): Payer: Self-pay | Admitting: Pediatrics

## 2019-12-04 ENCOUNTER — Other Ambulatory Visit: Payer: Self-pay

## 2019-12-04 ENCOUNTER — Emergency Department (HOSPITAL_COMMUNITY)
Admission: EM | Admit: 2019-12-04 | Discharge: 2019-12-04 | Disposition: A | Discharge status: Home / Self Care | Payer: HRSA Program | Attending: Emergency Medicine | Admitting: Emergency Medicine

## 2019-12-04 DIAGNOSIS — Z79899 Other long term (current) drug therapy: Secondary | ICD-10-CM | POA: Diagnosis not present

## 2019-12-04 DIAGNOSIS — I714 Abdominal aortic aneurysm, without rupture, unspecified: Secondary | ICD-10-CM

## 2019-12-04 DIAGNOSIS — K5900 Constipation, unspecified: Secondary | ICD-10-CM | POA: Diagnosis not present

## 2019-12-04 DIAGNOSIS — I10 Essential (primary) hypertension: Secondary | ICD-10-CM | POA: Insufficient documentation

## 2019-12-04 DIAGNOSIS — R1010 Upper abdominal pain, unspecified: Secondary | ICD-10-CM

## 2019-12-04 DIAGNOSIS — Z87891 Personal history of nicotine dependence: Secondary | ICD-10-CM | POA: Diagnosis not present

## 2019-12-04 DIAGNOSIS — R1110 Vomiting, unspecified: Secondary | ICD-10-CM | POA: Diagnosis present

## 2019-12-04 DIAGNOSIS — U071 COVID-19: Secondary | ICD-10-CM | POA: Insufficient documentation

## 2019-12-04 DIAGNOSIS — R112 Nausea with vomiting, unspecified: Secondary | ICD-10-CM

## 2019-12-04 LAB — URINALYSIS, ROUTINE W REFLEX MICROSCOPIC
Bacteria, UA: NONE SEEN
Bilirubin Urine: NEGATIVE
Glucose, UA: NEGATIVE mg/dL
Ketones, ur: NEGATIVE mg/dL
Leukocytes,Ua: NEGATIVE
Nitrite: NEGATIVE
Protein, ur: >=300 mg/dL — AB
RBC / HPF: >50 RBC/hpf — ABNORMAL HIGH (ref 0–5)
Specific Gravity, Urine: 1.02 (ref 1.005–1.030)
pH: 5 (ref 5.0–8.0)

## 2019-12-04 LAB — RESP PANEL BY RT-PCR (FLU A&B, COVID) ARPGX2
Influenza A by PCR: NEGATIVE
Influenza B by PCR: NEGATIVE
SARS Coronavirus 2 by RT PCR: POSITIVE — AB

## 2019-12-04 LAB — BASIC METABOLIC PANEL
Anion gap: 16 — ABNORMAL HIGH (ref 5–15)
BUN: 42 mg/dL — ABNORMAL HIGH (ref 6–20)
CO2: 19 mmol/L — ABNORMAL LOW (ref 22–32)
Calcium: 8.9 mg/dL (ref 8.9–10.3)
Chloride: 100 mmol/L (ref 98–111)
Creatinine, Ser: 4.64 mg/dL — ABNORMAL HIGH (ref 0.61–1.24)
GFR, Estimated: 15 mL/min — ABNORMAL LOW (ref >60)
Glucose, Bld: 135 mg/dL — ABNORMAL HIGH (ref 70–99)
Potassium: 4 mmol/L (ref 3.5–5.1)
Sodium: 135 mmol/L (ref 135–145)

## 2019-12-04 LAB — CBC
HCT: 49.4 % (ref 39.0–52.0)
Hemoglobin: 15.5 g/dL (ref 13.0–17.0)
MCH: 26.3 pg (ref 26.0–34.0)
MCHC: 31.4 g/dL (ref 30.0–36.0)
MCV: 83.7 fL (ref 80.0–100.0)
Platelets: 98 10*3/uL — ABNORMAL LOW (ref 150–400)
RBC: 5.9 MIL/uL — ABNORMAL HIGH (ref 4.22–5.81)
RDW: 15.5 % (ref 11.5–15.5)
WBC: 3.4 10*3/uL — ABNORMAL LOW (ref 4.0–10.5)
nRBC: 0 % (ref 0.0–0.2)

## 2019-12-04 LAB — HEPATIC FUNCTION PANEL
ALT: 14 U/L (ref 0–44)
AST: 26 U/L (ref 15–41)
Albumin: 3.6 g/dL (ref 3.5–5.0)
Alkaline Phosphatase: 71 U/L (ref 38–126)
Bilirubin, Direct: <0.1 mg/dL (ref 0.0–0.2)
Total Bilirubin: 0.7 mg/dL (ref 0.3–1.2)
Total Protein: 7.1 g/dL (ref 6.5–8.1)

## 2019-12-04 LAB — LIPASE, BLOOD: Lipase: 74 U/L — ABNORMAL HIGH (ref 11–51)

## 2019-12-04 MED ORDER — OMEPRAZOLE 40 MG PO CPDR: 40.0000 mg | DELAYED_RELEASE_CAPSULE | Freq: Two times a day (BID) | ORAL | 0 refills | Status: DC

## 2019-12-04 MED ORDER — FAMOTIDINE IN NACL 20-0.9 MG/50ML-% IV SOLN
20.0000 mg | Freq: Once | INTRAVENOUS | Status: AC
  Administered 2019-12-04: 20 mg via INTRAVENOUS
  Filled 2019-12-04: qty 50

## 2019-12-04 MED ORDER — CARVEDILOL 12.5 MG PO TABS
25.0000 mg | ORAL_TABLET | Freq: Once | ORAL | Status: AC
  Administered 2019-12-04: 25 mg via ORAL
  Filled 2019-12-04: qty 2

## 2019-12-04 MED ORDER — SUCRALFATE 1 GM/10ML PO SUSP: 1.0000 g | Freq: Three times a day (TID) | ORAL | 0 refills | Status: DC

## 2019-12-04 MED ORDER — SODIUM CHLORIDE 0.9 % IV BOLUS
500.0000 mL | Freq: Once | INTRAVENOUS | Status: AC
  Administered 2019-12-04: 500 mL via INTRAVENOUS

## 2019-12-04 MED ORDER — MORPHINE SULFATE (PF) 4 MG/ML IV SOLN
4.0000 mg | Freq: Once | INTRAVENOUS | Status: AC
  Administered 2019-12-04: 4 mg via INTRAVENOUS
  Filled 2019-12-04: qty 1

## 2019-12-04 MED ORDER — CLONIDINE HCL 0.2 MG PO TABS
0.3000 mg | ORAL_TABLET | Freq: Once | ORAL | Status: AC
  Administered 2019-12-04: 0.3 mg via ORAL
  Filled 2019-12-04: qty 1

## 2019-12-04 MED ORDER — ONDANSETRON HCL 4 MG/2ML IJ SOLN
4.0000 mg | Freq: Once | INTRAMUSCULAR | Status: AC
  Administered 2019-12-04: 4 mg via INTRAVENOUS
  Filled 2019-12-04: qty 2

## 2019-12-04 MED ORDER — AMLODIPINE BESYLATE 5 MG PO TABS
10.0000 mg | ORAL_TABLET | Freq: Once | ORAL | Status: AC
  Administered 2019-12-04: 10 mg via ORAL
  Filled 2019-12-04: qty 2

## 2019-12-04 MED ORDER — HYDRALAZINE HCL 25 MG PO TABS
100.0000 mg | ORAL_TABLET | Freq: Once | ORAL | Status: AC
  Administered 2019-12-04: 100 mg via ORAL
  Filled 2019-12-04: qty 4

## 2019-12-04 MED ORDER — ONDANSETRON 4 MG PO TBDP: ORAL_TABLET | ORAL | 0 refills | Status: DC

## 2019-12-04 MED ORDER — DIPHENHYDRAMINE HCL 25 MG PO CAPS
25.0000 mg | ORAL_CAPSULE | Freq: Once | ORAL | Status: AC
  Administered 2019-12-04: 25 mg via ORAL
  Filled 2019-12-04: qty 1

## 2019-12-04 NOTE — ED Notes (Signed)
Medication given for high bp

## 2019-12-04 NOTE — ED Triage Notes (Signed)
Patient c/o decreased appetite and weakness the last few days along w/ some nausea and abdominal pain relieved food intake. Stated hx of HTN,

## 2019-12-04 NOTE — ED Provider Notes (Signed)
[name] Provider Note   CSN: [id number] Arrival date & time: 12/04/19  1309     History Chief Complaint  Patient presents with  . Anorexia  . Weakness    David [name] is a 49 y.o. male.  [name] is a 49 y.o. male with hx og HTN, CKD, smoking, who presents to the emergency department for evaluation of abdominal pain.  Patient reports over the past week he is experience intermittent upper abdominal pains.  He states that he has episodes of pain daily, they seem to be worse if he lets his stomach get empty, improved if he eats small frequent meals.  He states that he has had multiple episodes of vomiting, reports 2 today, no hematemesis.  States occasionally he has had some mild constipation but has been able to pass stool and gas.  Denies any melena or hematochezia.  No associated chest pain or shortness of breath, no cough.  No dysuria or urinary frequency.  Denies history of similar symptoms.  Denies NSAID use or alcohol use.  Patient noted to be very hypertensive and states that he usually takes his blood pressure medications but due to his symptoms today he has not taken any of his blood pressure medications.        Past Medical History:  Diagnosis Date  . Hypertension   . Vitamin D deficiency     Patient Active Problem List   Diagnosis Date Noted  . Hypertensive urgency 10/14/2018  . Essential hypertension 10/14/2018  . History of medication noncompliance 10/14/2018  . Encounter to establish care 03/07/2018  . Smoker 02/28/2018  . Hematuria 02/28/2018  . ARF (acute renal failure) ([place])   . Hypertensive emergency 02/26/2018    History reviewed. No pertinent surgical history.     Family History  Family history unknown: Yes    Social History   Tobacco Use  . Smoking status: Former Research scientist (life sciences)  . Smokeless tobacco: Never Used  Substance Use Topics  . Alcohol use: No  . Drug use: No    Home Medications Prior to  Admission medications   Medication Sig Start Date End Date Taking? Authorizing Provider  amLODipine (NORVASC) 10 MG tablet Take 1 tablet (10 mg total) by mouth daily. 10/12/18  Yes Azzie Glatter, FNP  carvedilol (COREG) 25 MG tablet Take 1 tablet (25 mg total) by mouth 2 (two) times daily. 10/12/18  Yes Azzie Glatter, FNP  cloNIDine (CATAPRES) 0.3 MG tablet TAKE 1 TABLET (0.3 MG TOTAL) BY MOUTH 3 (THREE) TIMES DAILY. 08/02/19  Yes Azzie Glatter, FNP  hydrALAZINE (APRESOLINE) 100 MG tablet Take 1 tablet (100 mg total) by mouth every 8 (eight) hours. 10/12/18  Yes Azzie Glatter, FNP  spironolactone (ALDACTONE) 25 MG tablet Take 1 tablet (25 mg total) by mouth daily. 08/30/19  Yes Azzie Glatter, FNP  aspirin EC 81 MG tablet Take 1 tablet (81 mg total) by mouth daily. Patient not taking: Reported on 12/04/2019 08/15/16   Scot Jun, FNP  calcium-vitamin D Darron Doom WITH D) 500-200 MG-UNIT tablet Take 1 tablet by mouth daily with breakfast. Patient not taking: Reported on 12/04/2019 03/16/18   Azzie Glatter, FNP    Allergies    Patient has no known allergies.  Review of Systems   Review of Systems  Constitutional: Negative for chills and fever.  HENT: Negative.   Respiratory: Negative for cough and shortness of breath.   Cardiovascular: Negative for chest pain and leg  swelling.  Gastrointestinal: Positive for nausea and vomiting. Negative for blood in stool, constipation and diarrhea.  Genitourinary: Negative for dysuria and frequency.  Musculoskeletal: Negative for arthralgias and myalgias.  Skin: Negative for color change and rash.  Neurological: Negative for dizziness, syncope and light-headedness.    Physical Exam Updated Vital Signs BP (!) 191/143 (BP Location: Right Arm)   Pulse 88   Temp 98.9 F (37.2 C) (Oral)   Resp 16   Ht 5\' 11"  (1.803 m)   Wt 77.1 kg   SpO2 100%   BMI 23.71 kg/m   Physical Exam Vitals and nursing note reviewed.  Constitutional:        General: He is not in acute distress.    Appearance: Normal appearance. He is well-developed. He is not ill-appearing or diaphoretic.     Comments: Well-appearing and in no distress  HENT:     Head: Normocephalic and atraumatic.     Mouth/Throat:     Pharynx: Oropharynx is clear.     Comments: Mucous membranes slightly dry Eyes:     General:        Right eye: No discharge.        Left eye: No discharge.     Pupils: Pupils are equal, round, and reactive to light.  Cardiovascular:     Rate and Rhythm: Normal rate and regular rhythm.     Heart sounds: No murmur heard.  No friction rub. No gallop.   Pulmonary:     Effort: Pulmonary effort is normal. No respiratory distress.     Breath sounds: Normal breath sounds. No wheezing or rales.     Comments: Respirations equal and unlabored, patient able to speak in full sentences, lungs clear to auscultation bilaterally Abdominal:     General: Bowel sounds are normal. There is no distension.     Palpations: Abdomen is soft. There is no mass.     Tenderness: There is abdominal tenderness. There is no guarding.     Comments: Abdomen is soft, nondistended, bowel sounds present throughout, no pulsatile masses noted, tenderness across the upper abdomen most severe in the epigastric region, no periumbilical or lower abdominal tenderness noted, no guarding or peritoneal signs, negative Murphy sign  Musculoskeletal:        General: No deformity.     Cervical back: Neck supple.     Right lower leg: No edema.     Left lower leg: No edema.     Comments: Bilateral lower extremities warm and well perfused with 2+ DP pulses, no edema noted  Skin:    General: Skin is warm and dry.     Capillary Refill: Capillary refill takes less than 2 seconds.  Neurological:     Mental Status: He is alert.     Coordination: Coordination normal.     Comments: Speech is clear, able to follow commands CN III-XII intact Normal strength in upper and lower extremities  bilaterally including dorsiflexion and plantar flexion, strong and equal grip strength Sensation normal to light and sharp touch Moves extremities without ataxia, coordination intact  Psychiatric:        Mood and Affect: Mood normal.        Behavior: Behavior normal.     ED Results / Procedures / Treatments   Labs (all labs ordered are listed, but only abnormal results are displayed) Labs Reviewed  RESP PANEL BY RT-PCR (FLU A&B, COVID) ARPGX2 - Abnormal; Notable for the following components:      Result Value  SARS Coronavirus 2 by RT PCR POSITIVE (*)    All other components within normal limits  BASIC METABOLIC PANEL - Abnormal; Notable for the following components:   CO2 19 (*)    Glucose, Bld 135 (*)    BUN 42 (*)    Creatinine, Ser 4.64 (*)    GFR, Estimated 15 (*)    Anion gap 16 (*)    All other components within normal limits  CBC - Abnormal; Notable for the following components:   WBC 3.4 (*)    RBC 5.90 (*)    Platelets 98 (*)    All other components within normal limits  URINALYSIS, ROUTINE W REFLEX MICROSCOPIC - Abnormal; Notable for the following components:   APPearance HAZY (*)    Hgb urine dipstick LARGE (*)    Protein, ur >=300 (*)    RBC / HPF >50 (*)    All other components within normal limits  LIPASE, BLOOD - Abnormal; Notable for the following components:   Lipase 74 (*)    All other components within normal limits  HEPATIC FUNCTION PANEL  CBG MONITORING, ED    EKG None  Radiology CT ABDOMEN PELVIS WO CONTRAST  Result Date: 12/04/2019 CLINICAL DATA:  Acute abdominal pain, nonlocalized. Anorexia, weakness. EXAM: CT ABDOMEN AND PELVIS WITHOUT CONTRAST TECHNIQUE: Multidetector CT imaging of the abdomen and pelvis was performed following the standard protocol without IV contrast. COMPARISON:  Ultrasound abdomen 12/04/2019. FINDINGS: Lower chest: Bilateral lower lobe subsegmental atelectasis. Bilateral peripheral ground-glass airspace opacities.  Hepatobiliary: Hypodensity along the falciform ligament likely represents focal fatty infiltration. Otherwise no focal liver abnormality. No gallstones, gallbladder wall thickening, or pericholecystic fluid. No biliary dilatation. Pancreas: No focal lesion. Normal pancreatic contour. No surrounding inflammatory changes. No main pancreatic ductal dilatation. Spleen: Normal in size without focal abnormality. Adrenals/Urinary Tract: No adrenal nodule bilaterally. No nephrolithiasis, no hydronephrosis, and no contour-deforming renal mass. No ureterolithiasis or hydroureter. The urinary bladder is unremarkable. Stomach/Bowel: Stomach is within normal limits. No evidence of bowel wall thickening or dilatation. Appendix appears normal. Vascular/Lymphatic: The suprarenal abdominal aorta measures at the upper limits of normal (3 cm). Incompletely evaluated infrarenal abdominal aorta aneurysm extending from the origin of the renal arteries to the aortic bifurcation. The aneurysm measures up to 5.3 cm on axial imaging and courses approximately 14 cm in the craniocaudal dimension. There is suggestion of a hypodense crescentic sign (3:38) within the abdominal aorta that could represent a dissection. No hyperdense crescentic sign noted. No periaortic fat stranding. 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 Reproductive: Prostate is unremarkable. Other: No intraperitoneal free fluid. No intraperitoneal free gas. No organized fluid collection. Musculoskeletal: No abdominal wall hernia or abnormality No suspicious lytic or blastic osseous lesions. No acute displaced fracture. IMPRESSION: 1. 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 Suprarenal abdominal aorta measures at the upper limits of normal. Incompletely evaluated on this noncontrast study. Recommend CTA chest,  abdomen, pelvis for further evaluation. 2. Multifocal pneumonia.  COVID-19 infection not excluded. These results were called by telephone at the time of interpretation on 12/04/2019 at 6:57 pm to provider [facility] , who verbally acknowledged these results. Electronically Signed   By: Iven Finn M.D.   On: 12/04/2019 19:03   US Abdomen Limited RUQ (LIVER/GB)  Result Date: 12/04/2019 CLINICAL DATA:  Upper abdominal pain. EXAM: ULTRASOUND  ABDOMEN LIMITED RIGHT UPPER QUADRANT COMPARISON:  Renal ultrasound, dated February 27, 2018 FINDINGS: Gallbladder: No gallstones or wall thickening visualized (2.0 mm). No sonographic Murphy sign noted by sonographer. Common bile duct: Diameter: 3.7 mm Liver: A 2.1 cm x 0.9 cm x 2.6 cm lobulated area of increased echogenicity is seen within the anterior aspect of the left lobe of the liver. The echogenicity of the remainder of the liver parenchyma is within normal limits. Portal vein is patent on color Doppler imaging with normal direction of blood flow towards the liver. Other: Of incidental note is the presence of a right pleural effusion. Diffusely increased echogenicity of the right renal parenchyma is seen. This is present on the prior study. IMPRESSION: Hyperechoic area within the left lobe of the liver, as described above, which may represent a hemangioma or focal area of fatty infiltration. Further evaluation with nonemergent abdomen MRI is recommended. Electronically Signed   By: Virgina Norfolk M.D.   On: 12/04/2019 17:08    Procedures Procedures (including critical care time)  Medications Ordered in ED Medications  sodium chloride 0.9 % bolus 500 mL (0 mLs Intravenous Stopped 12/04/19 1703)  ondansetron (ZOFRAN) injection 4 mg (4 mg Intravenous Given 12/04/19 1617)  morphine 4 MG/ML injection 4 mg (4 mg Intravenous Given 12/04/19 1617)  famotidine (PEPCID) IVPB 20 mg premix (0 mg Intravenous Stopped 12/04/19 1703)  amLODipine (NORVASC) tablet 10  mg (10 mg Oral Given 12/04/19 1836)  hydrALAZINE (APRESOLINE) tablet 100 mg (100 mg Oral Given 12/04/19 1836)  cloNIDine (CATAPRES) tablet 0.3 mg (0.3 mg Oral Given 12/04/19 1835)  carvedilol (COREG) tablet 25 mg (25 mg Oral Given 12/04/19 1836)    ED Course  I have reviewed the triage vital signs and the nursing notes.  Pertinent labs & imaging results that were available during my care of the patient were reviewed by me and considered in my medical decision making (see chart for details).    MDM Rules/Calculators/A&P                         49 year old male presents with 1 week of intermittent upper abdominal pain most notable in the epigastric region, he reports associated vomiting.  Pain seems to be improved if he eats small frequent meals, when his stomach is empty that seems to worsen his symptoms.  No associated fevers or chills.  No abnormal bowel movements, no hematemesis, melena or hematochezia.  Denies history of similar symptoms before.  No associated chest pain or shortness of breath.  Patient also noted to be very hypertensive with blood pressure of 202/145 on arrival, did not take his blood pressure medication, per chart review patient is on hydralazine, clonidine, carvedilol and amlodipine for blood pressure control.  Basic labs ordered from triage, which I have reviewed.  Interventions: Will give IV fluids, Zofran, morphine and Pepcid.  We will also give patient's home blood pressure medications.  Patient with mild leukopenia, normal hemoglobin, mild thrombocytopenia which she has had previously as well.  Patient with creatinine of 4.64, which is at his baseline when compared to prior, BUN of 42 and anion gap of 16, suspect patient is likely a bit dehydrated, glucose of 135 but no other significant electrolyte derangements.  Given abdominal symptoms will check LFTs and lipase as well as urinalysis.  EKG with sinus rhythm with signs of LVH likely in the setting of patient's  chronic hypertension, no acute changes noted.  Given patient's upper abdominal pain  that seems to be intermittent and affected by meals concern for possible biliary colic, will start with right upper quadrant ultrasound if this is unremarkable may require additional imaging.  Differential also includes gastritis, PUD, gastroenteritis, given patient's significant hypertension also consider AAA although given intermittent nature of pain and location feel this is less likely and patient is not currently showing signs of ruptured AAA.  Mildly elevated lipase of 74, LFTs are unremarkable.  Right upper quadrant ultrasound with an hyperechoic area within the left lobe of the liver, likely hemangioma or focal fatty infiltration and nonemergent MRI is recommended for follow-up but no evidence of cholecystitis, common bile duct with normal diameter.  No explanation on imaging for patient's pain.  We will proceed with Noncon CT for further evaluation.   Called by radiology regarding CT findings, CT of the abdomen pelvis significant for infrarenal abdominal aortic aneurysm, with no known history, aneurysm measures 5.3 cm, there is no surrounding stranding or obvious signs of rupture, because this is a noncontrasted study, unable to clearly determine if there is any signs of dissection.  CTA of the chest abdomen pelvis is recommended.  Patient also noted to have signs of multifocal pneumonia with groundglass opacities in bilateral lung bases concerning for potential Covid pneumonia, patient without known Covid infection, will perform Covid testing.  Patient is not having cough, fever, chest pain or shortness of breath.  While CTA of the chest abdomen and pelvis is recommended to assess for dissection given patient's chronic renal disease bolus dose contrast required for CT angio study would likely end up putting the patient on dialysis.  Patient symptoms seem atypical for dissection.  Will consult and discuss with  vascular surgery prior to ordering this image.  I did call and speak with radiology and they stated there is no other imaging option to truly assess for dissection.  Case discussed with Dr. Carlis Abbott with vascular surgery who reviewed patient's imaging, reassured that there is not surrounding stranding to suggest rupture, he agrees that patient's pain and symptoms seem quite atypical for pain from aneurysm or dissection especially since they are intermittent and more so located in the upper abdomen rather than the periumbilical or lower abdomen of patient's infrarenal AAA.  After discussing patient's symptoms and presentation in detail, he feels that dissection is extremely unlikely and that this study would likely be low yield and is not worth the risk of putting patient on dialysis.  Recommends outpatient vascular follow-up for the patient, continued aggressive blood pressure control with his current regimen.  Strict return precautions for rupture should be provided.  I discussed CT results with patient and his wife at bedside, discussed follow-up for AAA with vascular surgery, stressed the importance of treating his blood pressure regularly.  After patient was given his home blood pressure medications his hypertension has improved significantly here in the ED.  Patient will follow up as an outpatient with vascular surgery for continued evaluation and monitoring of AAA.  Strict return precautions regarding signs of rupture or dissection provided.  Suspect patient's upper abdominal pain may be more so related to gastritis or PUD, symptoms have improved significantly here in the emergency department.  Will treat with omeprazole, Zofran and Carafate to help manage symptoms, also discussed dietary changes and GI follow-up.  Patient also found to be Covid positive which could also be contributing to these GI symptoms.  Patient is not experiencing any chest pain, shortness of breath or cough and has had normal O2 sats  here in the emergency department.  No need for admission from a Covid standpoint.  Unsure how long patient has had Covid symptoms as the only symptoms he has been having have been his abdominal pain.  Patient is unvaccinated.  Discussed strict return precautions and outpatient follow-up, patient and his wife expressed understanding and agreement with plan.  Discharged home in good condition.  Final Clinical Impression(s) / ED Diagnoses Final diagnoses:  Upper abdominal pain  Non-intractable vomiting with nausea, unspecified vomiting type  Abdominal aortic aneurysm (AAA) without rupture ([place])  COVID-19 virus infection    Rx / DC Orders ED Discharge Orders         Ordered    ondansetron (ZOFRAN ODT) 4 MG disintegrating tablet        12/04/19 2253    omeprazole (PRILOSEC) 40 MG capsule  2 times daily before meals        12/04/19 2253    sucralfate (CARAFATE) 1 GM/10ML suspension  3 times daily with meals & bedtime        12/04/19 2253           Jacqlyn Larsen, PA-C 12/05/19 [facility]    Veryl Speak, MD 12/09/19 [phone]

## 2019-12-04 NOTE — ED Notes (Signed)
To ultrasound

## 2019-12-04 NOTE — ED Notes (Signed)
Pain better  Pt returned from ultra sound

## 2019-12-04 NOTE — ED Notes (Signed)
Pt transported to ultrasound.

## 2019-12-04 NOTE — ED Notes (Signed)
Pt c.o a headache 

## 2019-12-04 NOTE — Discharge Instructions (Addendum)
Your CT scan shows that you have an abdominal aortic aneurysm, we do not see signs of rupture with this today, we will have you follow-up with the vascular clinic for this. If you develop severe abdominal pain at or below your bellybutton especially if pain radiates to your back or feels like it is ripping or tearing, you feel lightheaded like you are going to pass out or develop other new or concerning symptoms you should return to the emergency department immediately. High blood pressure puts you at increased risk for rupture of this so please make sure you are taking your blood pressure medication regularly as directed. You should receive a phone call from the Vascular clinic to schedule follow-up.  I suspect your abdominal symptoms may be related to an ulcer or inflammation of your stomach or small intestine lining. Please begin taking omeprazole twice daily to help reduce acid production. Use Zofran to help with nausea and vomiting, and use Carafate to help coat and protect the lining of your stomach and small intestine. Eat small frequent meals that are bland, avoid acidic foods. Please call to schedule close follow-up with [facility].  You are also found to have a Covid infection, we see some signs of Covid pneumonia on your CT but your oxygen has been good. You should treat symptoms supportively and monitor closely for worsening shortness of breath, or chest pain, you may experience fevers or chills. Symptoms usually last 10 to 14 days. This could also be contributing to your abdominal pain, nausea and vomiting.  Please return to the ED if you have any new or worsening symptoms.

## 2019-12-04 NOTE — ED Notes (Signed)
The pt has been taken to c-t

## 2019-12-04 NOTE — ED Notes (Signed)
Pt wants something to eat  Npo so far she is also c/o being cold  She still has a temp and more cover is not recommended

## 2019-12-05 ENCOUNTER — Telehealth: Payer: Self-pay | Admitting: Nurse Practitioner

## 2019-12-05 NOTE — Telephone Encounter (Signed)
Called to Discuss with patient about Covid symptoms and the use of the monoclonal antibody infusion for those with mild to moderate Covid symptoms and at a high risk of hospitalization.     Pt appears to qualify for this infusion due to co-morbid conditions and/or a member of an at-risk group in accordance with the FDA Emergency Use Authorization. (BMI > 25, htn, CKD)   Unable to reach pt. Voicemail left.   Alda Lea, NP [facility]  [phone]

## 2019-12-06 ENCOUNTER — Telehealth: Payer: Self-pay | Admitting: Nurse Practitioner

## 2019-12-06 NOTE — Telephone Encounter (Signed)
COVID [facility] Contact Note   Attempt to contact via telephone to discuss symptoms and evaluate for interest and qualifications for [facility] treatment. Patient qualifies for at-risk group according to the FDA Emergency Use Authorization.   Unable to reach patient by telephone. Unable to leave voicemail. No MyChart active for patient.   Worthy Keeler, DNP, AGNP-c COVID-19 [facility] [phone]

## 2019-12-07 ENCOUNTER — Telehealth (HOSPITAL_COMMUNITY): Payer: Self-pay | Admitting: Family

## 2019-12-07 DIAGNOSIS — U071 COVID-19: Secondary | ICD-10-CM

## 2019-12-07 NOTE — Telephone Encounter (Signed)
Called to discuss with David [name] about Covid symptoms and the use of casirivimab/imdevimab, a combination monoclonal antibody infusion for those with mild to moderate Covid symptoms and at a high risk of hospitalization.     Pt does not qualify for infusion therapy as his symptoms first presented > 10 days prior (on Nov 15th per patient report) to timing of infusion. Symptoms tier reviewed as well as criteria for ending isolation. Preventative practices reviewed. Patient verbalized understanding   Patient Active Problem List   Diagnosis Date Noted  . Hypertensive urgency 10/14/2018  . Essential hypertension 10/14/2018  . History of medication noncompliance 10/14/2018  . Encounter to establish care 03/07/2018  . Smoker 02/28/2018  . Hematuria 02/28/2018  . ARF (acute renal failure) ([place])   . Hypertensive emergency 02/26/2018    Glenda Spelman,NP

## 2019-12-10 ENCOUNTER — Ambulatory Visit (INDEPENDENT_AMBULATORY_CARE_PROVIDER_SITE_OTHER): Payer: Self-pay | Admitting: Vascular Surgery

## 2019-12-10 ENCOUNTER — Encounter: Payer: Self-pay | Admitting: Vascular Surgery

## 2019-12-10 ENCOUNTER — Other Ambulatory Visit: Payer: Self-pay

## 2019-12-10 VITALS — BP 181/132 | HR 91 | Temp 98.4°F | Resp 20 | Ht 71.0 in | Wt 195.0 lb

## 2019-12-10 DIAGNOSIS — I714 Abdominal aortic aneurysm, without rupture, unspecified: Secondary | ICD-10-CM

## 2019-12-10 DIAGNOSIS — N184 Chronic kidney disease, stage 4 (severe): Secondary | ICD-10-CM

## 2019-12-10 NOTE — Progress Notes (Signed)
ASSESSMENT & PLAN:  49 y.o. male with 47mm infrarenal abdominal aortic aneurysm. CKDIV. Severe hypertension (>5 drugs). Limited English speaker with poor health literacy.   He is near the threshold for aneurysm repair, but his renal insufficiency needs to be addressed first. I worry he will likely need HD soon based on GFR. Referral to nephrology. I can certainly create dialysis access for him if needed in the near future.  Return to care in 6 months for repeat US measurement of AAA. Ideally we would wait for him to start HD prior to intervention. I do not think he would be a candidate for asymptomatic open reconstruction despite his young age based on his renal dysfunction. EVAR may be the best option for him to avoid intraoperative hemodynamic changes and resuscitation requirements of open repair.  CHIEF COMPLAINT:   AAA  HISTORY:  HISTORY OF PRESENT ILLNESS: David [name] is a 49 y.o. male with COVID-19 infection, CKD 4, incidental note of abdominal aortic aneurysm during recent ER visit.  He is originally from the [place] and works as a Geophysicist/field seismologist.  He speaks [place], but does not seem to understand some of what I am saying to him.  His wife is along with him and seems to speak better [place].  He has severe hypertension, and is on a 5 drug regimen for this.  He has no knowledge of his severe renal insufficiency, and was surprised when I mentioned it in the office today.  He has no abdominal pain.  Past Medical History:  Diagnosis Date  . Hypertension   . Vitamin D deficiency     History reviewed. No pertinent surgical history.  Family History  Family history unknown: Yes    Social History   Socioeconomic History  . Marital status: Married    Spouse name: Not on file  . Number of children: Not on file  . Years of education: Not on file  . Highest education level: Not on file  Occupational History  . Not on file  Tobacco Use  . Smoking status: Former Research scientist (life sciences)  . Smokeless  tobacco: Never Used  Vaping Use  . Vaping Use: Never used  Substance and Sexual Activity  . Alcohol use: No  . Drug use: No  . Sexual activity: Not on file  Other Topics Concern  . Not on file  Social History Narrative  . Not on file   Social Determinants of Health   Financial Resource Strain:   . Difficulty of Paying Living Expenses: Not on file  Food Insecurity:   . Worried About Charity fundraiser in the Last Year: Not on file  . Ran Out of Food in the Last Year: Not on file  Transportation Needs:   . Lack of Transportation (Medical): Not on file  . Lack of Transportation (Non-Medical): Not on file  Physical Activity:   . Days of Exercise per Week: Not on file  . Minutes of Exercise per Session: Not on file  Stress:   . Feeling of Stress : Not on file  Social Connections:   . Frequency of Communication with Friends and Family: Not on file  . Frequency of Social Gatherings with Friends and Family: Not on file  . Attends Religious Services: Not on file  . Active Member of Clubs or Organizations: Not on file  . Attends Archivist Meetings: Not on file  . Marital Status: Not on file  Intimate Partner Violence:   . Fear of Current  or Ex-Partner: Not on file  . Emotionally Abused: Not on file  . Physically Abused: Not on file  . Sexually Abused: Not on file    No Known Allergies  Current Outpatient Medications  Medication Sig Dispense Refill  . amLODipine (NORVASC) 10 MG tablet Take 1 tablet (10 mg total) by mouth daily. 30 tablet 3  . carvedilol (COREG) 25 MG tablet Take 1 tablet (25 mg total) by mouth 2 (two) times daily. 60 tablet 3  . cloNIDine (CATAPRES) 0.3 MG tablet TAKE 1 TABLET (0.3 MG TOTAL) BY MOUTH 3 (THREE) TIMES DAILY. 270 tablet 1  . hydrALAZINE (APRESOLINE) 100 MG tablet Take 1 tablet (100 mg total) by mouth every 8 (eight) hours. 90 tablet 3  . omeprazole (PRILOSEC) 40 MG capsule Take 1 capsule (40 mg total) by mouth 2 (two) times daily before  a meal. 60 capsule 0  . ondansetron (ZOFRAN ODT) 4 MG disintegrating tablet 4mg  ODT q4 hours prn nausea/vomit 10 tablet 0  . spironolactone (ALDACTONE) 25 MG tablet Take 1 tablet (25 mg total) by mouth daily. 30 tablet 0  . sucralfate (CARAFATE) 1 GM/10ML suspension Take 10 mLs (1 g total) by mouth 4 (four) times daily -  with meals and at bedtime. 420 mL 0  . aspirin EC 81 MG tablet Take 1 tablet (81 mg total) by mouth daily. (Patient not taking: Reported on 12/04/2019) 90 tablet 2  . calcium-vitamin D (OSCAL WITH D) 500-200 MG-UNIT tablet Take 1 tablet by mouth daily with breakfast. (Patient not taking: Reported on 12/04/2019) 30 tablet 3   No current facility-administered medications for this visit.    REVIEW OF SYSTEMS:  [X]  denotes positive finding, [ ]  denotes negative finding Cardiac  Comments:  Chest pain or chest pressure:    Shortness of breath upon exertion:    Short of breath when lying flat:    Irregular heart rhythm:        Vascular    Pain in calf, thigh, or hip brought on by ambulation:    Pain in feet at night that wakes you up from your sleep:     Blood clot in your veins:    Leg swelling:         Pulmonary    Oxygen at home:    Productive cough:     Wheezing:         Neurologic    Sudden weakness in arms or legs:     Sudden numbness in arms or legs:     Sudden onset of difficulty speaking or slurred speech:    Temporary loss of vision in one eye:     Problems with dizziness:         Gastrointestinal    Blood in stool:     Vomited blood:         Genitourinary    Burning when urinating:     Blood in urine:        Psychiatric    Major depression:         Hematologic    Bleeding problems:    Problems with blood clotting too easily:        Skin    Rashes or ulcers:        Constitutional    Fever or chills:     PHYSICAL EXAM:   Vitals:   12/10/19 1509  BP: (!) 181/132  Pulse: 91  Resp: 20  Temp: 98.4 F (36.9 C)  SpO2: 97%  Weight:  195  lb (88.5 kg)  Height: 5\' 11"  (1.803 m)   Constitutional: Well appearing in no distress. Appears well nourished.  Neurologic: Normal gait and station. CN intact. No weakness. No sensory loss. Psychiatric: Mood and affect symmetric and appropriate. Eyes: No icterus. No conjunctival pallor. Ears, nose, throat: mucous membranes moist. Midline trachea. No carotid bruit. Cardiac: regular rate and rhythm.  Respiratory: unlabored. Abdominal: soft, non-tender, non-distended. (+) palpable pulsatile abdominal mass. Peripheral vascular:  Radial pulse: L 2+ / R 2+  Dorsalis pedis pulse: L 2+ / R 2+ Extremity: No edema. No cyanosis. No pallor.  Skin: No gangrene. No ulceration.  Lymphatic: No Stemmer's sign. No palpable lymphadenopathy.   DATA REVIEW:    Most recent CBC CBC Latest Ref Rng & Units 12/04/2019 03/07/2018 03/01/2018  WBC 4.0 - 10.5 K/uL 3.4(L) 4.7 5.3  Hemoglobin 13.0 - 17.0 g/dL 15.5 12.4(L) 13.2  Hematocrit 39 - 52 % 49.4 37.5 41.2  Platelets 150 - 400 K/uL 98(L) 299 127(L)     Most recent CMP CMP Latest Ref Rng & Units 12/04/2019 03/07/2018 03/02/2018  Glucose 70 - 99 mg/dL 135(H) 102(H) 106(H)  BUN 6 - 20 mg/dL 42(H) 43(H) 37(H)  Creatinine 0.61 - 1.24 mg/dL 4.64(H) 4.30(H) 4.62(H)  Sodium 135 - 145 mmol/L 135 134 135  Potassium 3.5 - 5.1 mmol/L 4.0 4.7 3.4(L)  Chloride 98 - 111 mmol/L 100 98 98  CO2 22 - 32 mmol/L 19(L) 21 27  Calcium 8.9 - 10.3 mg/dL 8.9 9.4 9.1  Total Protein 6.5 - 8.1 g/dL 7.1 6.6 -  Total Bilirubin 0.3 - 1.2 mg/dL 0.7 <0.2 -  Alkaline Phos 38 - 126 U/L 71 76 -  AST 15 - 41 U/L 26 10 -  ALT 0 - 44 U/L 14 8 -    Renal function Estimated Creatinine Clearance: 20.3 mL/min (A) (by C-G formula based on SCr of 4.64 mg/dL (H)).  Hemoglobin A1C (%)  Date Value  03/07/2018 5.2    LDL Calculated  Date Value Ref Range Status  03/07/2018 135 (H) 0 - 99 mg/dL Final     Vascular Imaging: CT of abdomen / pelvis without contrast 12/04/19. 89mm  infrarenal abdominal aortic aneurysm. Evaluation severely limited by lack of contrast.   Yevonne Aline. Stanford Breed, MD [facility] Phone Number: [phone] 12/10/2019 3:20 PM

## 2019-12-11 DIAGNOSIS — R809 Proteinuria, unspecified: Secondary | ICD-10-CM

## 2019-12-11 HISTORY — DX: Proteinuria, unspecified: R80.9

## 2019-12-24 ENCOUNTER — Ambulatory Visit (INDEPENDENT_AMBULATORY_CARE_PROVIDER_SITE_OTHER): Payer: Self-pay | Admitting: Family Medicine

## 2019-12-24 ENCOUNTER — Other Ambulatory Visit: Payer: Self-pay | Admitting: Family Medicine

## 2019-12-24 ENCOUNTER — Other Ambulatory Visit: Payer: Self-pay

## 2019-12-24 ENCOUNTER — Encounter: Payer: Self-pay | Admitting: Family Medicine

## 2019-12-24 VITALS — BP 149/95 | HR 67 | Temp 98.2°F | Resp 20 | Ht 71.0 in | Wt 198.0 lb

## 2019-12-24 DIAGNOSIS — Z720 Tobacco use: Secondary | ICD-10-CM

## 2019-12-24 DIAGNOSIS — I714 Abdominal aortic aneurysm, without rupture, unspecified: Secondary | ICD-10-CM

## 2019-12-24 DIAGNOSIS — R112 Nausea with vomiting, unspecified: Secondary | ICD-10-CM

## 2019-12-24 DIAGNOSIS — I10 Essential (primary) hypertension: Secondary | ICD-10-CM

## 2019-12-24 DIAGNOSIS — R109 Unspecified abdominal pain: Secondary | ICD-10-CM

## 2019-12-24 DIAGNOSIS — Z716 Tobacco abuse counseling: Secondary | ICD-10-CM

## 2019-12-24 DIAGNOSIS — Z09 Encounter for follow-up examination after completed treatment for conditions other than malignant neoplasm: Secondary | ICD-10-CM

## 2019-12-24 MED ORDER — DOCUSATE SODIUM 100 MG PO CAPS: 100.0000 mg | ORAL_CAPSULE | Freq: Two times a day (BID) | ORAL | 3 refills | Status: DC

## 2019-12-24 MED ORDER — CARVEDILOL 25 MG PO TABS: 25.0000 mg | ORAL_TABLET | Freq: Two times a day (BID) | ORAL | 3 refills | Status: DC

## 2019-12-24 MED ORDER — BUPROPION HCL ER (SR) 150 MG PO TB12: 150.0000 mg | ORAL_TABLET | Freq: Two times a day (BID) | ORAL | 3 refills | Status: DC

## 2019-12-24 MED ORDER — AMLODIPINE BESYLATE 10 MG PO TABS: 10.0000 mg | ORAL_TABLET | Freq: Every day | ORAL | 3 refills | Status: DC

## 2019-12-24 MED ORDER — HYDRALAZINE HCL 100 MG PO TABS: 100.0000 mg | ORAL_TABLET | Freq: Three times a day (TID) | ORAL | 3 refills | Status: DC

## 2019-12-24 MED ORDER — SPIRONOLACTONE 25 MG PO TABS: 25.0000 mg | ORAL_TABLET | Freq: Every day | ORAL | 3 refills | Status: DC

## 2019-12-24 MED FILL — CARVEDILOL 25 MG TABLET: 25 | 30 days supply | Qty: 60 | Fill #0

## 2019-12-24 MED FILL — hydrALAZINE HCL 100 MG TABS: 100 | 30 days supply | Qty: 90 | Fill #0

## 2019-12-24 MED FILL — BUPROPION SR 150 MG TABLET: 150 | 30 days supply | Qty: 60 | Fill #0

## 2019-12-24 MED FILL — AMLODIPINE BESYLATE 10 MG T: 10 | 30 days supply | Qty: 30 | Fill #0

## 2019-12-24 MED FILL — SPIRONOLACTONE 25 MG TABLET: 25 | 30 days supply | Qty: 30 | Fill #0

## 2019-12-24 NOTE — Progress Notes (Signed)
Patient [facility] Follow Up   Subjective:  Patient ID: David [name], male    DOB: 30-Jan-1970  Age: 49 y.o. MRN: [id number]  CC: No chief complaint on file.   HPI David [name] is a 49 year old male who presents for Hospital  Follow Up today.    Patient Active Problem List   Diagnosis Date Noted  . Hypertensive urgency 10/14/2018  . Essential hypertension 10/14/2018  . History of medication noncompliance 10/14/2018  . Encounter to establish care 03/07/2018  . Smoker 02/28/2018  . Hematuria 02/28/2018  . ARF (acute renal failure) ([place])   . Hypertensive emergency 02/26/2018    Current Status: Since his last office visit, he has had and ED visit for Abdominal Pain on 12/04/2019. Denies GI problems such as diarrhea, and constipation. He has no reports of blood in stools, dysuria, hematuria, nausea and vomiting.Today ** is doing well with no complaints. He is accompanied by his wife today. He denies visual changes, chest pain, cough, shortness of breath, heart  palpitations, and falls. He has occasional headaches and dizziness with position changes. Denies severe headaches, confusion, seizures, double vision, and blurred vision. He denies fevers, chills, fatigue, recent infections, weight loss, and night sweats. No depression or anxiety reported today. He states that he is taking all medications as prescribed. He denies pain today.   Past Medical History:  Diagnosis Date  . AAA (abdominal aortic aneurysm) ([place]) 11/2019  . Hypertension   . Vitamin D deficiency     No past surgical history on file.  Family History  Family history unknown: Yes    Social History   Socioeconomic History  . Marital status: Married    Spouse name: Not on file  . Number of children: Not on file  . Years of education: Not on file  . Highest education level: Not on file  Occupational History  . Not on file  Tobacco Use  . Smoking status: Former  Research scientist (life sciences)  . Smokeless tobacco: Never Used  Vaping Use  . Vaping Use: Never used  Substance and Sexual Activity  . Alcohol use: No  . Drug use: No  . Sexual activity: Not on file  Other Topics Concern  . Not on file  Social History Narrative  . Not on file   Social Determinants of Health   Financial Resource Strain: Not on file  Food Insecurity: Not on file  Transportation Needs: Not on file  Physical Activity: Not on file  Stress: Not on file  Social Connections: Not on file  Intimate Partner Violence: Not on file    Outpatient Medications Prior to Visit  Medication Sig Dispense Refill  . omeprazole (PRILOSEC) 40 MG capsule Take 1 capsule (40 mg total) by mouth 2 (two) times daily before a meal. 60 capsule 0  . spironolactone (ALDACTONE) 25 MG tablet Take 1 tablet (25 mg total) by mouth daily. 30 tablet 0  . aspirin EC 81 MG tablet Take 1 tablet (81 mg total) by mouth daily. (Patient not taking: Reported on 12/24/2019) 90 tablet 2  . calcium-vitamin D (OSCAL WITH D) 500-200 MG-UNIT tablet Take 1 tablet by mouth daily with breakfast. (Patient not taking: No sig reported) 30 tablet 3  . ondansetron (ZOFRAN ODT) 4 MG disintegrating tablet 4mg  ODT q4 hours prn nausea/vomit (Patient not taking: Reported on 12/24/2019) 10 tablet 0  . sucralfate (CARAFATE) 1 GM/10ML suspension Take 10 mLs (1 g total) by mouth 4 (four)  times daily -  with meals and at bedtime. (Patient not taking: Reported on 12/24/2019) 420 mL 0  . amLODipine (NORVASC) 10 MG tablet Take 1 tablet (10 mg total) by mouth daily. (Patient not taking: Reported on 12/24/2019) 30 tablet 3  . carvedilol (COREG) 25 MG tablet Take 1 tablet (25 mg total) by mouth 2 (two) times daily. (Patient not taking: Reported on 12/24/2019) 60 tablet 3  . cloNIDine (CATAPRES) 0.3 MG tablet TAKE 1 TABLET (0.3 MG TOTAL) BY MOUTH 3 (THREE) TIMES DAILY. (Patient not taking: Reported on 12/24/2019) 270 tablet 1  . hydrALAZINE (APRESOLINE) 100 MG tablet  Take 1 tablet (100 mg total) by mouth every 8 (eight) hours. (Patient not taking: Reported on 12/24/2019) 90 tablet 3   No facility-administered medications prior to visit.    No Known Allergies  ROS Review of Systems  Constitutional: Negative.   HENT: Negative.   Eyes: Negative.   Respiratory: Negative.   Cardiovascular: Negative.   Gastrointestinal: Positive for abdominal pain (occasional) and nausea (occasional).  Endocrine: Negative.   Genitourinary: Negative.   Musculoskeletal: Negative.   Skin: Negative.   Allergic/Immunologic: Negative.   Neurological: Positive for dizziness (occasional) and headaches (occasional).  Hematological: Negative.   Psychiatric/Behavioral: Negative.       Objective:    Physical Exam Vitals and nursing note reviewed.  Constitutional:      Appearance: Normal appearance.  HENT:     Head: Normocephalic and atraumatic.     Nose: Nose normal.     Mouth/Throat:     Mouth: Mucous membranes are moist.     Pharynx: Oropharynx is clear.  Cardiovascular:     Rate and Rhythm: Normal rate and regular rhythm.     Pulses: Normal pulses.     Heart sounds: Normal heart sounds.  Pulmonary:     Effort: Pulmonary effort is normal.     Breath sounds: Normal breath sounds.  Abdominal:     General: Bowel sounds are normal.     Palpations: Abdomen is soft.  Musculoskeletal:        General: Normal range of motion.     Cervical back: Normal range of motion and neck supple.  Skin:    General: Skin is warm and dry.  Neurological:     General: No focal deficit present.     Mental Status: He is alert and oriented to person, place, and time.  Psychiatric:        Mood and Affect: Mood normal.        Behavior: Behavior normal.        Thought Content: Thought content normal.        Judgment: Judgment normal.    BP (!) 149/95   Pulse 67   Temp 98.2 F (36.8 C)   Resp 20   Ht 5\' 11"  (1.803 m)   Wt 198 lb (89.8 kg)   SpO2 99%   BMI 27.62 kg/m  Wt  Readings from Last 3 Encounters:  12/24/19 198 lb (89.8 kg)  12/10/19 195 lb (88.5 kg)  12/04/19 170 lb (77.1 kg)    Health Maintenance Due  Topic Date Due  . Hepatitis C Screening  Never done  . COVID-19 Vaccine (1) Never done  . INFLUENZA VACCINE  08/11/2019  . COLONOSCOPY  Never done    There are no preventive care reminders to display for this patient.  Lab Results  Component Value Date   TSH 1.210 12/24/2019   Lab Results  Component Value Date  WBC 3.3 (L) 12/24/2019   HGB 11.8 (L) 12/24/2019   HCT 37.7 12/24/2019   MCV 83 12/24/2019   PLT 398 12/24/2019   Lab Results  Component Value Date   NA 139 12/24/2019   K 5.2 12/24/2019   CO2 21 12/24/2019   GLUCOSE 101 (H) 12/24/2019   BUN 37 (H) 12/24/2019   CREATININE 5.30 (H) 12/24/2019   BILITOT <0.2 12/24/2019   ALKPHOS 85 12/24/2019   AST 9 12/24/2019   ALT 8 12/24/2019   PROT 6.8 12/24/2019   ALBUMIN 3.9 (L) 12/24/2019   CALCIUM 9.2 12/24/2019   ANIONGAP 16 (H) 12/04/2019   Lab Results  Component Value Date   CHOL 189 12/24/2019   Lab Results  Component Value Date   HDL 38 (L) 12/24/2019   Lab Results  Component Value Date   LDLCALC 135 (H) 12/24/2019   Lab Results  Component Value Date   TRIG 86 12/24/2019   Lab Results  Component Value Date   CHOLHDL 5.0 12/24/2019   Lab Results  Component Value Date   HGBA1C 5.2 03/07/2018      Assessment & Plan:   1. Hospital discharge follow-up - Urinalysis  2. Abdominal pain, unspecified abdominal location Stable. Monitor.  - Ambulatory referral to Gastroenterology - Ambulatory referral to Vascular Surgery  3. Abdominal aortic aneurysm (AAA) without rupture (HCC) Stable. Reviewed precautions extensively to patient and wife to decrease risk of rupture. Written material also given. He will report to office of ED if adverse symptoms occur.  - docusate sodium (COLACE) 100 MG capsule; Take 1 capsule (100 mg total) by mouth 2 (two) times daily.   Dispense: 180 capsule; Refill: 3 - Ambulatory referral to Vascular Surgery  4. Nausea and vomiting, intractability of vomiting not specified, unspecified vomiting type Stable today.  - Ambulatory referral to Gastroenterology - Ambulatory referral to Vascular Surgery  5. Essential hypertension The current medical regimen is effective; blood pressure is stable today; continue present plan and medications as prescribed. He will continue to take medications as prescribed, to decrease high sodium intake, excessive alcohol intake, increase potassium intake, smoking cessation, and increase physical activity of at least 30 minutes of cardio activity daily. He will continue to follow Heart Healthy or DASH diet. - CBC with Differential - Comprehensive metabolic panel - Lipid Panel - TSH - Vitamin B12 - Vitamin D, 25-hydroxy - HgB A1c - Urinalysis - spironolactone (ALDACTONE) 25 MG tablet; Take 1 tablet (25 mg total) by mouth daily.  Dispense: 90 tablet; Refill: 3 - amLODipine (NORVASC) 10 MG tablet; Take 1 tablet (10 mg total) by mouth daily.  Dispense: 90 tablet; Refill: 3 - carvedilol (COREG) 25 MG tablet; Take 1 tablet (25 mg total) by mouth 2 (two) times daily.  Dispense: 180 tablet; Refill: 3 - hydrALAZINE (APRESOLINE) 100 MG tablet; Take 1 tablet (100 mg total) by mouth every 8 (eight) hours.  Dispense: 90 tablet; Refill: 3  6. Encounter for smoking cessation counseling Discussed benefits of quitting smoking in detail today. We will initiate Buproprion today.  - buPROPion (WELLBUTRIN SR) 150 MG 12 hr tablet; Take 1 tablet (150 mg total) by mouth 2 (two) times daily.  Dispense: 180 tablet; Refill: 3  7. Follow up   . Meds ordered this encounter  Medications  . docusate sodium (COLACE) 100 MG capsule    Sig: Take 1 capsule (100 mg total) by mouth 2 (two) times daily.    Dispense:  180 capsule    Refill:  3  . buPROPion (WELLBUTRIN SR) 150 MG 12 hr tablet    Sig: Take 1 tablet (150 mg  total) by mouth 2 (two) times daily.    Dispense:  180 tablet    Refill:  3  . spironolactone (ALDACTONE) 25 MG tablet    Sig: Take 1 tablet (25 mg total) by mouth daily.    Dispense:  90 tablet    Refill:  3  . amLODipine (NORVASC) 10 MG tablet    Sig: Take 1 tablet (10 mg total) by mouth daily.    Dispense:  90 tablet    Refill:  3  . carvedilol (COREG) 25 MG tablet    Sig: Take 1 tablet (25 mg total) by mouth 2 (two) times daily.    Dispense:  180 tablet    Refill:  3  . hydrALAZINE (APRESOLINE) 100 MG tablet    Sig: Take 1 tablet (100 mg total) by mouth every 8 (eight) hours.    Dispense:  90 tablet    Refill:  3    Orders Placed This Encounter  Procedures  . CBC with Differential  . Comprehensive metabolic panel  . Lipid Panel  . TSH  . Vitamin B12  . Vitamin D, 25-hydroxy  . Urinalysis  . Ambulatory referral to Gastroenterology  . Ambulatory referral to Vascular Surgery  . HgB A1c    Referral Orders     Ambulatory referral to Gastroenterology     Ambulatory referral to Vascular Surgery   Kathe Becton, MSN, ANE, FNP-BC [place], [place] [address] [phone]    Problem List Items Addressed This Visit      Cardiovascular and Mediastinum   Essential hypertension   Relevant Medications   spironolactone (ALDACTONE) 25 MG tablet   amLODipine (NORVASC) 10 MG tablet   carvedilol (COREG) 25 MG tablet   hydrALAZINE (APRESOLINE) 100 MG tablet   Other Relevant Orders   CBC with Differential (Completed)   Comprehensive metabolic panel (Completed)   Lipid Panel (Completed)   TSH (Completed)   Vitamin B12 (Completed)   Vitamin D, 25-hydroxy (Completed)   HgB A1c   Urinalysis (Completed)    Other Visit Diagnoses    Hospital discharge follow-up    -  Primary   Relevant Orders   Urinalysis (Completed)   Abdominal pain,  unspecified abdominal location       Relevant Orders   Ambulatory referral to Gastroenterology   Ambulatory referral to Vascular Surgery   Abdominal aortic aneurysm (AAA) without rupture (HCC)       Relevant Medications   docusate sodium (COLACE) 100 MG capsule   spironolactone (ALDACTONE) 25 MG tablet   amLODipine (NORVASC) 10 MG tablet   carvedilol (COREG) 25 MG tablet   hydrALAZINE (APRESOLINE) 100 MG tablet   Other Relevant Orders   Ambulatory referral to Vascular Surgery   Nausea and vomiting, intractability of vomiting not specified, unspecified vomiting type       Relevant Orders   Ambulatory referral to Gastroenterology   Ambulatory referral to Vascular Surgery   Declined smoking cessation       Encounter for smoking cessation counseling       Relevant Medications   buPROPion (WELLBUTRIN SR) 150 MG 12 hr tablet   Follow up          Meds ordered this encounter  Medications  . docusate sodium (COLACE) 100 MG capsule  Sig: Take 1 capsule (100 mg total) by mouth 2 (two) times daily.    Dispense:  180 capsule    Refill:  3  . buPROPion (WELLBUTRIN SR) 150 MG 12 hr tablet    Sig: Take 1 tablet (150 mg total) by mouth 2 (two) times daily.    Dispense:  180 tablet    Refill:  3  . spironolactone (ALDACTONE) 25 MG tablet    Sig: Take 1 tablet (25 mg total) by mouth daily.    Dispense:  90 tablet    Refill:  3  . amLODipine (NORVASC) 10 MG tablet    Sig: Take 1 tablet (10 mg total) by mouth daily.    Dispense:  90 tablet    Refill:  3  . carvedilol (COREG) 25 MG tablet    Sig: Take 1 tablet (25 mg total) by mouth 2 (two) times daily.    Dispense:  180 tablet    Refill:  3  . hydrALAZINE (APRESOLINE) 100 MG tablet    Sig: Take 1 tablet (100 mg total) by mouth every 8 (eight) hours.    Dispense:  90 tablet    Refill:  3    Follow-up: No follow-ups on file.    Azzie Glatter, FNP

## 2019-12-24 NOTE — Patient Instructions (Addendum)
Abdominal Aortic Aneurysm  An aneurysm is a bulge in one of the blood vessels that carry blood away from the heart (artery). It happens when blood pushes up against a weak or damaged place in the wall of an artery. An abdominal aortic aneurysm happens in the main artery of the body (aorta). Some aneurysms may not cause problems. If it grows, it can burst or tear, causing bleeding inside the body. This is an emergency. It needs to be treated right away. What are the causes? The exact cause of this condition is not known. What increases the risk? The following may make you more likely to get this condition:  Being a male who is 60 years of age or older.  Being white (Caucasian).  Using tobacco.  Having a family history of aneurysms.  Having the following conditions: ? Hardening of the arteries (arteriosclerosis). ? Inflammation of the walls of an artery (arteritis). ? Certain genetic conditions. ? Being very overweight (obesity). ? An infection in the wall of the aorta (infectious aortitis). ? High cholesterol. ? High blood pressure (hypertension). What are the signs or symptoms? Symptoms depend on the size of the aneurysm and how fast it is growing. Most grow slowly and do not cause any symptoms. If symptoms do occur, they may include:  Pain in the belly (abdomen), side, or back.  Feeling full after eating only small amounts of food.  Feeling a throbbing lump in the belly. Symptoms that the aneurysm has burst (ruptured) include:  Sudden, very bad pain in the belly, side, or back.  Feeling sick to your stomach (nauseous).  Throwing up (vomiting).  Feeling light-headed or passing out. How is this treated? Treatment for this condition depends on:  The size of the aneurysm.  How fast it is growing.  Your age.  Your risk of having it burst. If your aneurysm is smaller than 2 inches (5 cm), your doctor may manage it by:  Checking it often to see if it is getting bigger.  You may have an imaging test (ultrasound) to check it every 3-6 months, every year, or every few years.  Giving you medicines to: ? Control blood pressure. ? Treat pain. ? Fight infection. If your aneurysm is larger than 2 inches (5 cm), you may need surgery to fix it. Follow these instructions at home: Lifestyle  Do not use any products that have nicotine or tobacco in them. This includes cigarettes, e-cigarettes, and chewing tobacco. If you need help quitting, ask your doctor.  Get regular exercise. Ask your doctor what types of exercise are best for you. Eating and drinking  Eat a heart-healthy diet. This includes eating plenty of: ? Fresh fruits and vegetables. ? Whole grains. ? Low-fat (lean) protein. ? Low-fat dairy products.  Avoid foods that are high in saturated fat and cholesterol. These foods include red meat and some dairy products.  Do not drink alcohol if: ? Your doctor tells you not to drink. ? You are pregnant, may be pregnant, or are planning to become pregnant.  If you drink alcohol: ? Limit how much you use to:  0-1 drink a day for women.  0-2 drinks a day for men. ? Be aware of how much alcohol is in your drink. In the [place] one drink equals any of these:  One typical bottle of beer (12 oz).  One-half glass of wine (5 oz).  One shot of hard liquor (1 oz). General instructions  Take over-the-counter and prescription medicines only as   told by your doctor.  Keep your blood pressure within normal limits. Ask your doctor what your blood pressure should be.  Have your blood sugar (glucose) level and cholesterol levels checked regularly. Keep your blood sugar level and cholesterol levels within normal limits.  Avoid heavy lifting and activities that take a lot of effort. Ask your doctor what activities are safe for you.  Keep all follow-up visits as told by your doctor. This is important. ? Talk to your doctor about regular screenings to see if the  aneurysm is getting bigger. Contact a doctor if you:  Have pain in your belly, side, or back.  Have a throbbing feeling in your belly.  Have a family history of aneurysms. Get help right away if you:  Have sudden, bad pain in your belly, side, or back.  Feel sick to your stomach.  Throw up.  Have trouble pooping (constipation).  Have trouble peeing (urinating).  Feel light-headed.  Have a fast heart rate when you stand.  Have sweaty skin that is cold to the touch (clammy).  Have shortness of breath.  Have a fever. These symptoms may be an emergency. Do not wait to see if the symptoms will go away. Get medical help right away. Call your local emergency services (911 in the [place] Do not drive yourself to the hospital. Summary  An aneurysm is a bulge in one of the blood vessels that carry blood away from the heart (artery). Some aneurysms may not cause problems.  You may need to have yours checked often. If it grows, it can burst or tear. This causes bleeding inside the body. It needs to be treated right away.  Follow instructions from your doctor about healthy lifestyle changes.  Keep all follow-up visits as told by your doctor. This is important. This information is not intended to replace advice given to you by your health care provider. Make sure you discuss any questions you have with your health care provider. Document Revised: 04/16/2018 Document Reviewed: 08/05/2017 Elsevier Patient Education  [place].   Bupropion sustained-release tablets (smoking cessation) What is this medicine? BUPROPION (byoo PROE pee on) is used to help people quit smoking. This medicine may be used for other purposes; ask your health care provider or pharmacist if you have questions. COMMON BRAND NAME(S): Buproban, Zyban What should I tell my health care provider before I take this medicine? They need to know if you have any of these conditions:  an eating disorder, such as  anorexia or bulimia  bipolar disorder or psychosis  diabetes or high blood sugar, treated with medication  glaucoma  head injury or brain tumor  heart disease, previous heart attack, or irregular heart beat  high blood pressure  kidney or liver disease  seizures  suicidal thoughts or a previous suicide attempt  Tourette's syndrome  weight loss  an unusual or allergic reaction to bupropion, other medicines, foods, dyes, or preservatives  breast-feeding  pregnant or trying to become pregnant How should I use this medicine? Take this medicine by mouth with a glass of water. Follow the directions on the prescription label. You can take it with or without food. If it upsets your stomach, take it with food. Do not cut, crush or chew this medicine. Take your medicine at regular intervals. If you take this medicine more than once a day, take your second dose at least 8 hours after you take your first dose. To limit difficulty in sleeping, avoid taking this medicine  at bedtime. Do not take your medicine more often than directed. Do not stop taking this medicine suddenly except upon the advice of your doctor. Stopping this medicine too quickly may cause serious side effects. A special MedGuide will be given to you by the pharmacist with each prescription and refill. Be sure to read this information carefully each time. Talk to your pediatrician regarding the use of this medicine in children. Special care may be needed. Overdosage: If you think you have taken too much of this medicine contact a poison control center or emergency room at once. NOTE: This medicine is only for you. Do not share this medicine with others. What if I miss a dose? If you miss a dose, skip the missed dose and take your next tablet at the regular time. There should be at least 8 hours between doses. Do not take double or extra doses. What may interact with this medicine? Do not take this medicine with any of the  following medications:  linezolid  MAOIs like Azilect, Carbex, Eldepryl, Marplan, Nardil, and Parnate  methylene blue (injected into a vein)  other medicines that contain bupropion like Wellbutrin This medicine may also interact with the following medications:  alcohol  certain medicines for anxiety or sleep  certain medicines for blood pressure like metoprolol, propranolol  certain medicines for depression or psychotic disturbances  certain medicines for HIV or AIDS like efavirenz, lopinavir, nelfinavir, ritonavir  certain medicines for irregular heart beat like propafenone, flecainide  certain medicines for Parkinson's disease like amantadine, levodopa  certain medicines for seizures like carbamazepine, phenytoin, phenobarbital  cimetidine  clopidogrel  cyclophosphamide  digoxin  furazolidone  isoniazid  nicotine  orphenadrine  procarbazine  steroid medicines like prednisone or cortisone  stimulant medicines for attention disorders, weight loss, or to stay awake  tamoxifen  theophylline  thiotepa  ticlopidine  tramadol  warfarin This list may not describe all possible interactions. Give your health care provider a list of all the medicines, herbs, non-prescription drugs, or dietary supplements you use. Also tell them if you smoke, drink alcohol, or use illegal drugs. Some items may interact with your medicine. What should I watch for while using this medicine? Visit your doctor or healthcare provider for regular checks on your progress. This medicine should be used together with a patient support program. It is important to participate in a behavioral program, counseling, or other support program that is recommended by your healthcare provider. This medicine may cause serious skin reactions. They can happen weeks to months after starting the medicine. Contact your healthcare provider right away if you notice fevers or flu-like symptoms with a rash. The  rash may be red or purple and then turn into blisters or peeling of the skin. Or, you might notice a red rash with swelling of the face, lips or lymph nodes in your neck or under your arms. Patients and their families should watch out for new or worsening thoughts of suicide or depression. Also watch out for sudden changes in feelings such as feeling anxious, agitated, panicky, irritable, hostile, aggressive, impulsive, severely restless, overly excited and hyperactive, or not being able to sleep. If this happens, especially at the beginning of treatment or after a change in dose, call your healthcare provider. Avoid alcoholic drinks while taking this medicine. Drinking excessive alcoholic beverages, using sleeping or anxiety medicines, or quickly stopping the use of these agents while taking this medicine may increase your risk for a seizure. Do not drive or use  heavy machinery until you know how this medicine affects you. This medicine can impair your ability to perform these tasks. Do not take this medicine close to bedtime. It may prevent you from sleeping. Your mouth may get dry. Chewing sugarless gum or sucking hard candy, and drinking plenty of water may help. Contact your doctor if the problem does not go away or is severe. Do not use nicotine patches or chewing gum without the advice of your doctor or healthcare provider while taking this medicine. You may need to have your blood pressure taken regularly if your doctor recommends that you use both nicotine and this medicine together. What side effects may I notice from receiving this medicine? Side effects that you should report to your doctor or health care professional as soon as possible:  allergic reactions like skin rash, itching or hives, swelling of the face, lips, or tongue  breathing problems  changes in vision  confusion  elevated mood, decreased need for sleep, racing thoughts, impulsive behavior  fast or irregular  heartbeat  hallucinations, loss of contact with reality  increased blood pressure  rash, fever, and swollen lymph nodes  redness, blistering, peeling, or loosening of the skin, including inside the mouth  seizures  suicidal thoughts or other mood changes  unusually weak or tired  vomiting Side effects that usually do not require medical attention (report to your doctor or health care professional if they continue or are bothersome):  constipation  headache  loss of appetite  nausea  tremors  weight loss This list may not describe all possible side effects. Call your doctor for medical advice about side effects. You may report side effects to FDA at 1-800-FDA-1088. Where should I keep my medicine? Keep out of the reach of children. Store at room temperature between 20 and 25 degrees C (68 and 77 degrees F). Protect from light. Keep container tightly closed. Throw away any unused medicine after the expiration date. NOTE: This sheet is a summary. It may not cover all possible information. If you have questions about this medicine, talk to your doctor, pharmacist, or health care provider.  2020 Elsevier/Gold Standard (2018-03-22 13:59:09) Docusate capsules What is this medicine? DOCUSATE (doc CUE sayt) is stool softener. It helps prevent constipation and straining or discomfort associated with hard or dry stools. This medicine may be used for other purposes; ask your health care provider or pharmacist if you have questions. COMMON BRAND NAME(S): BeneHealth Stool Softner, Colace, Colace Clear, Correctol, D.O.S., DC, Doc-Q-Lace, DocuLace, Docusoft S, DOK, DOK Extra Strength, Dulcolax, Genasoft, Kao-Tin, Kaopectate Liqui-Gels, Phillips Stool Softener, Stool Softener, Stool Softner DC, Sulfolax, Sur-Q-Lax, Surfak, Uni-Ease What should I tell my health care provider before I take this medicine? They need to know if you have any of these conditions:  nausea or vomiting  severe  constipation  stomach pain  sudden change in bowel habit lasting more than 2 weeks  an unusual or allergic reaction to docusate, other medicines, foods, dyes, or preservatives  pregnant or trying to get pregnant  breast-feeding How should I use this medicine? Take this medicine by mouth with a glass of water. Follow the directions on the label. Take your doses at regular intervals. Do not take your medicine more often than directed. Talk to your pediatrician regarding the use of this medicine in children. While this medicine may be prescribed for children as young as 2 years for selected conditions, precautions do apply. Overdosage: If you think you have taken too much of  this medicine contact a poison control center or emergency room at once. NOTE: This medicine is only for you. Do not share this medicine with others. What if I miss a dose? If you miss a dose, take it as soon as you can. If it is almost time for your next dose, take only that dose. Do not take double or extra doses. What may interact with this medicine?  mineral oil This list may not describe all possible interactions. Give your health care provider a list of all the medicines, herbs, non-prescription drugs, or dietary supplements you use. Also tell them if you smoke, drink alcohol, or use illegal drugs. Some items may interact with your medicine. What should I watch for while using this medicine? Do not use for more than one week without advice from your doctor or health care professional. If your constipation returns, check with your doctor or health care professional. Drink plenty of water while taking this medicine. Drinking water helps decrease constipation. Stop using this medicine and contact your doctor or health care professional if you experience any rectal bleeding or do not have a bowel movement after use. These could be signs of a more serious condition. What side effects may I notice from receiving this  medicine? Side effects that you should report to your doctor or health care professional as soon as possible:  allergic reactions like skin rash, itching or hives, swelling of the face, lips, or tongue Side effects that usually do not require medical attention (report to your doctor or health care professional if they continue or are bothersome):  diarrhea  stomach cramps  throat irritation This list may not describe all possible side effects. Call your doctor for medical advice about side effects. You may report side effects to FDA at 1-800-FDA-1088. Where should I keep my medicine? Keep out of the reach of children. Store at room temperature between 15 and 30 degrees C (59 and 86 degrees F). Throw away any unused medicine after the expiration date. NOTE: This sheet is a summary. It may not cover all possible information. If you have questions about this medicine, talk to your doctor, pharmacist, or health care provider.  2020 Elsevier/Gold Standard (2007-04-19 15:56:49)

## 2019-12-25 ENCOUNTER — Encounter: Payer: Self-pay | Admitting: Family Medicine

## 2019-12-25 LAB — LIPID PANEL
Chol/HDL Ratio: 5 ratio (ref 0.0–5.0)
Cholesterol, Total: 189 mg/dL (ref 100–199)
HDL: 38 mg/dL — ABNORMAL LOW (ref >39)
LDL Chol Calc (NIH): 135 mg/dL — ABNORMAL HIGH (ref 0–99)
Triglycerides: 86 mg/dL (ref 0–149)
VLDL Cholesterol Cal: 16 mg/dL (ref 5–40)

## 2019-12-25 LAB — CBC WITH DIFFERENTIAL/PLATELET
Basophils Absolute: 0 10*3/uL (ref 0.0–0.2)
Basos: 1 %
EOS (ABSOLUTE): 0.1 10*3/uL (ref 0.0–0.4)
Eos: 2 %
Hematocrit: 37.7 % (ref 37.5–51.0)
Hemoglobin: 11.8 g/dL — ABNORMAL LOW (ref 13.0–17.7)
Immature Grans (Abs): 0 10*3/uL (ref 0.0–0.1)
Immature Granulocytes: 1 %
Lymphocytes Absolute: 0.8 10*3/uL (ref 0.7–3.1)
Lymphs: 24 %
MCH: 26 pg — ABNORMAL LOW (ref 26.6–33.0)
MCHC: 31.3 g/dL — ABNORMAL LOW (ref 31.5–35.7)
MCV: 83 fL (ref 79–97)
Monocytes Absolute: 0.6 10*3/uL (ref 0.1–0.9)
Monocytes: 19 %
Neutrophils Absolute: 1.7 10*3/uL (ref 1.4–7.0)
Neutrophils: 53 %
Platelets: 398 10*3/uL (ref 150–450)
RBC: 4.53 x10E6/uL (ref 4.14–5.80)
RDW: 14 % (ref 11.6–15.4)
WBC: 3.3 10*3/uL — ABNORMAL LOW (ref 3.4–10.8)

## 2019-12-25 LAB — URINALYSIS
Bilirubin, UA: NEGATIVE
Glucose, UA: NEGATIVE
Leukocytes,UA: NEGATIVE
Nitrite, UA: NEGATIVE
RBC, UA: NEGATIVE
Specific Gravity, UA: 1.021 (ref 1.005–1.030)
Urobilinogen, Ur: 0.2 mg/dL (ref 0.2–1.0)
pH, UA: 5.5 (ref 5.0–7.5)

## 2019-12-25 LAB — COMPREHENSIVE METABOLIC PANEL
ALT: 8 IU/L (ref 0–44)
AST: 9 IU/L (ref 0–40)
Albumin/Globulin Ratio: 1.3 (ref 1.2–2.2)
Albumin: 3.9 g/dL — ABNORMAL LOW (ref 4.0–5.0)
Alkaline Phosphatase: 85 IU/L (ref 44–121)
BUN/Creatinine Ratio: 7 — ABNORMAL LOW (ref 9–20)
BUN: 37 mg/dL — ABNORMAL HIGH (ref 6–24)
Bilirubin Total: <0.2 mg/dL (ref 0.0–1.2)
CO2: 21 mmol/L (ref 20–29)
Calcium: 9.2 mg/dL (ref 8.7–10.2)
Chloride: 102 mmol/L (ref 96–106)
Creatinine, Ser: 5.3 mg/dL — ABNORMAL HIGH (ref 0.76–1.27)
GFR calc Af Amer: 13 mL/min/{1.73_m2} — ABNORMAL LOW (ref >59)
GFR calc non Af Amer: 12 mL/min/{1.73_m2} — ABNORMAL LOW (ref >59)
Globulin, Total: 2.9 g/dL (ref 1.5–4.5)
Glucose: 101 mg/dL — ABNORMAL HIGH (ref 65–99)
Potassium: 5.2 mmol/L (ref 3.5–5.2)
Sodium: 139 mmol/L (ref 134–144)
Total Protein: 6.8 g/dL (ref 6.0–8.5)

## 2019-12-25 LAB — VITAMIN B12: Vitamin B-12: 1053 pg/mL (ref 232–1245)

## 2019-12-25 LAB — VITAMIN D 25 HYDROXY (VIT D DEFICIENCY, FRACTURES): Vit D, 25-Hydroxy: 23.6 ng/mL — ABNORMAL LOW (ref 30.0–100.0)

## 2019-12-25 LAB — TSH: TSH: 1.21 u[IU]/mL (ref 0.450–4.500)

## 2019-12-25 MED ORDER — CLONIDINE HCL 0.3 MG PO TABS: 0.3000 mg | ORAL_TABLET | Freq: Three times a day (TID) | ORAL | 3 refills | Status: DC

## 2019-12-25 MED FILL — cloNIDine HCL 0.3 MG TABS: 0.3 | 30 days supply | Qty: 90 | Fill #0

## 2019-12-31 ENCOUNTER — Ambulatory Visit (INDEPENDENT_AMBULATORY_CARE_PROVIDER_SITE_OTHER): Payer: Self-pay | Admitting: Family Medicine

## 2019-12-31 ENCOUNTER — Other Ambulatory Visit: Payer: Self-pay

## 2019-12-31 ENCOUNTER — Telehealth: Payer: Self-pay

## 2019-12-31 VITALS — BP 141/88 | Temp 98.0°F

## 2019-12-31 DIAGNOSIS — I10 Essential (primary) hypertension: Secondary | ICD-10-CM

## 2019-12-31 NOTE — Telephone Encounter (Signed)
Patient has asked for an appointment to be made for Neurology he will not have insurance after 01-10-2020

## 2020-01-01 NOTE — Telephone Encounter (Signed)
Called on 12/31/19 gave patient phone number and address to [facility] as per referral

## 2020-01-07 NOTE — Progress Notes (Signed)
Patient here today for blood pressure check. Blood pressure stable at 141/88 today. The current medical regimen is effective; continue present plan and medications as prescribed.

## 2020-01-23 ENCOUNTER — Encounter: Payer: Self-pay | Admitting: Gastroenterology

## 2020-01-24 ENCOUNTER — Encounter: Payer: Self-pay | Admitting: Family Medicine

## 2020-01-24 ENCOUNTER — Telehealth (INDEPENDENT_AMBULATORY_CARE_PROVIDER_SITE_OTHER): Payer: Self-pay | Admitting: Family Medicine

## 2020-01-24 DIAGNOSIS — R809 Proteinuria, unspecified: Secondary | ICD-10-CM

## 2020-01-24 DIAGNOSIS — I714 Abdominal aortic aneurysm, without rupture, unspecified: Secondary | ICD-10-CM

## 2020-01-24 DIAGNOSIS — I10 Essential (primary) hypertension: Secondary | ICD-10-CM

## 2020-01-24 DIAGNOSIS — Z09 Encounter for follow-up examination after completed treatment for conditions other than malignant neoplasm: Secondary | ICD-10-CM

## 2020-01-24 DIAGNOSIS — Z87891 Personal history of nicotine dependence: Secondary | ICD-10-CM

## 2020-01-24 MED FILL — AMLODIPINE BESYLATE 10 MG T: 10 | 90 days supply | Qty: 90 | Fill #1

## 2020-01-24 MED FILL — SPIRONOLACTONE 25 MG TABLET: 25 | 90 days supply | Qty: 90 | Fill #1

## 2020-01-24 NOTE — Progress Notes (Signed)
Virtual Visit via Telephone Note  I connected with David [name] on 01/24/20 at 11:20 AM EST by telephone and verified that I am speaking with the correct person using two identifiers.  Location: Patient: Home Provider: Home   I discussed the limitations, risks, security and privacy concerns of performing an evaluation and management service by telephone and the availability of in person appointments. I also discussed with the patient that there may be a patient responsible charge related to this service. The patient expressed understanding and agreed to proceed.   History of Present Illness:  Patient Active Problem List   Diagnosis Date Noted  . Hypertensive urgency 10/14/2018  . Essential hypertension 10/14/2018  . History of medication noncompliance 10/14/2018  . Encounter to establish care 03/07/2018  . Smoker 02/28/2018  . Hematuria 02/28/2018  . ARF (acute renal failure) ([place])   . Hypertensive emergency 02/26/2018   Current Status: Since his last office visit, he is doing well with no complaints. He states that he has quit smoking since his last office visit. He has set up appointment with Nephrology on 02/05/2020. He denies visual changes, chest pain, cough, shortness of breath, heart palpitations, and falls. He has occasional headaches and dizziness with position changes. Denies severe headaches, confusion, seizures, double vision, and blurred vision, nausea and vomiting. He denies fevers, chills, fatigue, recent infections, weight loss, and night sweats. Denies GI problems such as diarrhea, and constipation. He has no reports of blood in stools, dysuria and hematuria. No depression or anxiety report today. He is taking all medications as prescribed. He denies pain today.   Observations/Objective:  Telephone Virtual Visit   Assessment and Plan:  1. Essential hypertension He will continue to take medications as prescribed, to decrease high sodium intake, excessive alcohol  intake, increase potassium intake, smoking cessation, and increase physical activity of at least 30 minutes of cardio activity daily. He will continue to follow Heart Healthy or DASH diet.  2. Abdominal aortic aneurysm (AAA) without rupture (HCC) Stable. Continue to follow up with Cardiology.   3. Former smoker Quit smoking 11/2019.  4. Proteinuria  5. Follow up He will follow up in 2-3 months.   No orders of the defined types were placed in this encounter.   No orders of the defined types were placed in this encounter.   Referral Orders  No referral(s) requested today    Kathe Becton, MSN, ANE, FNP-BC [facility] [place] [address] [place], [place] [address] [phone] fax    I discussed the assessment and treatment plan with the patient. The patient was provided an opportunity to ask questions and all were answered. The patient agreed with the plan and demonstrated an understanding of the instructions.   The patient was advised to call back or seek an in-person evaluation if the symptoms worsen or if the condition fails to improve as anticipated.  I provided 20 minutes of non-face-to-face time during this encounter.   Azzie Glatter, FNP

## 2020-01-25 ENCOUNTER — Encounter: Payer: Self-pay | Admitting: Family Medicine

## 2020-02-05 ENCOUNTER — Ambulatory Visit (INDEPENDENT_AMBULATORY_CARE_PROVIDER_SITE_OTHER): Payer: BC Managed Care – PPO | Admitting: Gastroenterology

## 2020-02-05 ENCOUNTER — Telehealth: Payer: Self-pay

## 2020-02-05 ENCOUNTER — Other Ambulatory Visit (INDEPENDENT_AMBULATORY_CARE_PROVIDER_SITE_OTHER): Payer: BC Managed Care – PPO

## 2020-02-05 ENCOUNTER — Encounter: Payer: Self-pay | Admitting: Gastroenterology

## 2020-02-05 ENCOUNTER — Other Ambulatory Visit: Payer: Self-pay

## 2020-02-05 VITALS — BP 164/86 | HR 98 | Ht 71.0 in | Wt 215.0 lb

## 2020-02-05 DIAGNOSIS — D649 Anemia, unspecified: Secondary | ICD-10-CM

## 2020-02-05 DIAGNOSIS — Z1212 Encounter for screening for malignant neoplasm of rectum: Secondary | ICD-10-CM | POA: Diagnosis not present

## 2020-02-05 DIAGNOSIS — K5900 Constipation, unspecified: Secondary | ICD-10-CM

## 2020-02-05 DIAGNOSIS — Z1211 Encounter for screening for malignant neoplasm of colon: Secondary | ICD-10-CM

## 2020-02-05 LAB — IBC + FERRITIN
Ferritin: 157.5 ng/mL (ref 22.0–322.0)
Iron: 61 ug/dL (ref 42–165)
Saturation Ratios: 19.7 % — ABNORMAL LOW (ref 20.0–50.0)
Transferrin: 221 mg/dL (ref 212.0–360.0)

## 2020-02-05 LAB — FOLATE: Folate: 8.3 ng/mL (ref >5.9)

## 2020-02-05 MED ORDER — FERROUS SULFATE 325 (65 FE) MG PO TABS: 325.0000 mg | ORAL_TABLET | Freq: Every day | ORAL | 1 refills | Status: DC

## 2020-02-05 MED FILL — CARVEDILOL 25 MG TABLET: 25 | 90 days supply | Qty: 180 | Fill #1

## 2020-02-05 NOTE — Telephone Encounter (Signed)
-----   Message from Ladene Artist, MD sent at 02/05/2020  1:40 PM EST ----- Suspect mild iron deficiency contributing to his anemia FeSO4 325 mg po qd, #30, 1 refill See today's office note regarding timing of colonoscopy

## 2020-02-05 NOTE — Patient Instructions (Signed)
Your provider has requested that you go to the basement level for lab work before leaving today. Press "B" on the elevator. The lab is located at the first door on the left as you exit the elevator.  Start over the counter Colace daily and if no improvement in your symptoms, then start over the counter Miralax 1-2 x daily.   Please follow up with your primary care physician regarding your renal function.   Due to recent changes in healthcare laws, you may see the results of your imaging and laboratory studies on MyChart before your provider has had a chance to review them.  We understand that in some cases there may be results that are confusing or concerning to you. Not all laboratory results come back in the same time frame and the provider may be waiting for multiple results in order to interpret others.  Please give [place] 48 hours in order for your provider to thoroughly review all the results before contacting the office for clarification of your results.   Normal BMI (Body Mass Index- based on height and weight) is between 19 and 25. Your BMI today is Body mass index is 29.99 kg/m. Marland Kitchen Please consider follow up  regarding your BMI with your Primary Care Provider.  Thank you for choosing me and [facility].  Pricilla Riffle. Dagoberto Ligas., MD., Marval Regal

## 2020-02-05 NOTE — Progress Notes (Signed)
History of Present Illness: This is a 50 year old male referred by Azzie Glatter, FNP for the evaluation of left sided abdominal pain, mild constipation, N/V. His abdominal pain and N/V resolved several weeks ago and have not recurred. He was treated with omeprazole and carafate for about 1 month and then discontinued them. He notes mild constipation with a BM every 2-3 days as his only current GI complaint. No prior colonoscopy. Denies weight loss, abdominal pain, diarrhea, change in stool caliber, melena, hematochezia, nausea, vomiting, dysphagia, reflux symptoms, chest pain.   CT AP 12/04/2019 Impression: 1. 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 Suprarenal abdominal aorta measures at the upper limits of normal. Incompletely evaluated on this noncontrast study. Recommend CTA chest, abdomen, pelvis for further evaluation. 2. Multifocal pneumonia.  COVID-19 infection not excluded.  Abd [place] 12/04/2019 IMPRESSION: Hyperechoic area within the left lobe of the liver, as described above, which may represent a hemangioma or focal area of fatty infiltration. Further evaluation with nonemergent abdomen MRI is recommended  Echo 02/2018 LV EF=55-60%, LVH    No Known Allergies Outpatient Medications Prior to Visit  Medication Sig Dispense Refill  . amLODipine (NORVASC) 10 MG tablet Take 1 tablet (10 mg total) by mouth daily. 90 tablet 3  . aspirin EC 81 MG tablet Take 1 tablet (81 mg total) by mouth daily. 90 tablet 2  . carvedilol (COREG) 25 MG tablet Take 1 tablet (25 mg total) by mouth 2 (two) times daily. 180 tablet 3  . cloNIDine (CATAPRES) 0.3 MG tablet Take 1 tablet (0.3 mg total) by mouth 3 (three) times daily. 270 tablet 3  . docusate sodium (COLACE) 100 MG capsule Take 1 capsule (100 mg total) by mouth 2 (two) times daily. 180 capsule 3  . hydrALAZINE (APRESOLINE) 100 MG tablet  Take 1 tablet (100 mg total) by mouth every 8 (eight) hours. 90 tablet 3  . omeprazole (PRILOSEC) 40 MG capsule Take 1 capsule (40 mg total) by mouth 2 (two) times daily before a meal. 60 capsule 0  . ondansetron (ZOFRAN ODT) 4 MG disintegrating tablet '4mg'$  ODT q4 hours prn nausea/vomit 10 tablet 0  . spironolactone (ALDACTONE) 25 MG tablet Take 1 tablet (25 mg total) by mouth daily. 90 tablet 3  . buPROPion (WELLBUTRIN SR) 150 MG 12 hr tablet Take 1 tablet (150 mg total) by mouth 2 (two) times daily. 180 tablet 3  . calcium-vitamin D (OSCAL WITH D) 500-200 MG-UNIT tablet Take 1 tablet by mouth daily with breakfast. (Patient not taking: No sig reported) 30 tablet 3  . sucralfate (CARAFATE) 1 GM/10ML suspension Take 10 mLs (1 g total) by mouth 4 (four) times daily -  with meals and at bedtime. (Patient not taking: Reported on 12/24/2019) 420 mL 0   No facility-administered medications prior to visit.   Past Medical History:  Diagnosis Date  . AAA (abdominal aortic aneurysm) ([place]) 11/2019  . Hypertension   . Proteinuria 12/2019  . Vitamin D deficiency    Past Surgical History:  Procedure Laterality Date  . none     Social History   Socioeconomic History  . Marital status: Married    Spouse name: Not on file  . Number of children: Not on file  . Years of education: Not on file  . Highest education level: Not on file  Occupational History  . Not on file  Tobacco Use  .  Smoking status: Former Research scientist (life sciences)  . Smokeless tobacco: Never Used  Vaping Use  . Vaping Use: Never used  Substance and Sexual Activity  . Alcohol use: No  . Drug use: No  . Sexual activity: Not on file  Other Topics Concern  . Not on file  Social History Narrative  . Not on file   Social Determinants of Health   Financial Resource Strain: Not on file  Food Insecurity: Not on file  Transportation Needs: Not on file  Physical Activity: Not on file  Stress: Not on file  Social Connections: Not on file    Family History  Family history unknown: Yes      Review of Systems: Pertinent positive and negative review of systems were noted in the above HPI section. All other review of systems were otherwise negative.   Physical Exam: General: Well developed, well nourished, no acute distress Head: Normocephalic and atraumatic Eyes:  sclerae anicteric, EOMI Ears: Normal auditory acuity Mouth: Not examined, mask on during Covid-19 pandemic Neck: Supple, no masses or thyromegaly Lungs: Clear throughout to auscultation Heart: Regular rate and rhythm; no murmurs, rubs or bruits Abdomen: Soft, non tender and non distended. No masses, hepatosplenomegaly or hernias noted. Normal Bowel sounds Rectal: deferred to colonoscopy Musculoskeletal: Symmetrical with no gross deformities  Skin: No lesions on visible extremities Pulses:  Normal pulses noted Extremities: No clubbing, cyanosis, edema or deformities noted Neurological: Alert oriented x 4, grossly nonfocal Cervical Nodes:  No significant cervical adenopathy Inguinal Nodes: No significant inguinal adenopathy Psychological:  Alert and cooperative. Normal mood and affect   Assessment and Recommendations:  1. Mild constipation. Increase daily water intake and dietary fiber intake. Colace once daily as needed and if not effective trial of Miralax qd to qod as needed.   2.  Left sided abdominal pain and N/V have resolved. Etiology unclear. Possible gastritis, ulcer.  Symptoms have abated and he is no longer taking omeprazole or carafate.  If symptoms recur consider further evaluation with EGD.  3. Anemia. B12 normal. CKD likely contributing. Check iron studies and folate today.  4. HTN on a multi drug regiment with worsening renal function.   5. AAA of 5.3 cm. Under consideration for EVAR for AAA after nephrology evaluation.   6. CRC screening, average risk. Consider colonoscopy in the future when HTN is well controlled, after nephrology  evaluation and likely after EVAR for AAA .     cc: Azzie Glatter, [address],  [place] [id number]

## 2020-02-05 NOTE — Telephone Encounter (Signed)
Left message for patient to please call back. Order for Iron supp. Sent to pharmacy

## 2020-02-18 ENCOUNTER — Ambulatory Visit (INDEPENDENT_AMBULATORY_CARE_PROVIDER_SITE_OTHER): Payer: BC Managed Care – PPO | Admitting: Internal Medicine

## 2020-02-18 ENCOUNTER — Encounter: Payer: Self-pay | Admitting: Internal Medicine

## 2020-02-18 ENCOUNTER — Other Ambulatory Visit: Payer: Self-pay

## 2020-02-18 VITALS — BP 175/108 | HR 62 | Temp 97.1°F | Ht 69.0 in | Wt 222.0 lb

## 2020-02-18 DIAGNOSIS — Z9114 Patient's other noncompliance with medication regimen: Secondary | ICD-10-CM

## 2020-02-18 DIAGNOSIS — I160 Hypertensive urgency: Secondary | ICD-10-CM | POA: Diagnosis not present

## 2020-02-18 DIAGNOSIS — N184 Chronic kidney disease, stage 4 (severe): Secondary | ICD-10-CM | POA: Diagnosis not present

## 2020-02-18 DIAGNOSIS — N185 Chronic kidney disease, stage 5: Secondary | ICD-10-CM | POA: Diagnosis not present

## 2020-02-20 LAB — BASIC METABOLIC PANEL WITH GFR
BUN/Creatinine Ratio: 11 (calc) (ref 6–22)
BUN: 42 mg/dL — ABNORMAL HIGH (ref 7–25)
CO2: 21 mmol/L (ref 20–32)
Calcium: 9.5 mg/dL (ref 8.6–10.3)
Chloride: 103 mmol/L (ref 98–110)
Creat: 3.84 mg/dL — ABNORMAL HIGH (ref 0.70–1.33)
GFR, Est African American: 20 mL/min/{1.73_m2} — ABNORMAL LOW (ref >=60)
GFR, Est Non African American: 17 mL/min/{1.73_m2} — ABNORMAL LOW (ref >=60)
Glucose, Bld: 84 mg/dL (ref 65–99)
Potassium: 5.2 mmol/L (ref 3.5–5.3)
Sodium: 136 mmol/L (ref 135–146)

## 2020-02-20 LAB — PROTEIN / CREATININE RATIO, URINE
Creatinine, Urine: 127 mg/dL (ref 20–320)
Protein/Creat Ratio: 740 mg/g creat — ABNORMAL HIGH (ref 22–128)
Protein/Creatinine Ratio: 0.74 mg/mg creat — ABNORMAL HIGH (ref 0.022–0.12)
Total Protein, Urine: 94 mg/dL — ABNORMAL HIGH (ref 5–25)

## 2020-02-20 LAB — VITAMIN D 25 HYDROXY (VIT D DEFICIENCY, FRACTURES): Vit D, 25-Hydroxy: 21 ng/mL — ABNORMAL LOW (ref 30–100)

## 2020-02-20 LAB — EXTRA LAV TOP TUBE

## 2020-02-20 LAB — PARATHYROID HORMONE, INTACT (NO CA): PTH: 209 pg/mL — ABNORMAL HIGH (ref 14–64)

## 2020-02-20 LAB — SPECIMEN COMPROMISED

## 2020-02-20 LAB — PHOSPHORUS: Phosphorus: 4.8 mg/dL — ABNORMAL HIGH (ref 2.5–4.5)

## 2020-02-20 MED FILL — hydrALAZINE HCL 100 MG TABS: 100 | 90 days supply | Qty: 270 | Fill #1

## 2020-02-20 NOTE — Progress Notes (Signed)
Contacted patient. Advised patient to comply with antihypertensive medications and to keep monitoring the BP at home.

## 2020-03-03 ENCOUNTER — Ambulatory Visit: Payer: BC Managed Care – PPO | Admitting: Internal Medicine

## 2020-03-10 ENCOUNTER — Encounter: Payer: Self-pay | Admitting: Internal Medicine

## 2020-03-10 ENCOUNTER — Other Ambulatory Visit: Payer: Self-pay | Admitting: Internal Medicine

## 2020-03-10 ENCOUNTER — Other Ambulatory Visit: Payer: Self-pay

## 2020-03-10 ENCOUNTER — Ambulatory Visit: Payer: BC Managed Care – PPO | Admitting: Internal Medicine

## 2020-03-10 VITALS — BP 163/103 | HR 66 | Temp 97.1°F | Ht 69.0 in | Wt 224.0 lb

## 2020-03-10 DIAGNOSIS — Z9114 Patient's other noncompliance with medication regimen: Secondary | ICD-10-CM

## 2020-03-10 DIAGNOSIS — I160 Hypertensive urgency: Secondary | ICD-10-CM

## 2020-03-10 DIAGNOSIS — I10 Essential (primary) hypertension: Secondary | ICD-10-CM

## 2020-03-10 DIAGNOSIS — I161 Hypertensive emergency: Secondary | ICD-10-CM

## 2020-03-10 DIAGNOSIS — N184 Chronic kidney disease, stage 4 (severe): Secondary | ICD-10-CM | POA: Diagnosis not present

## 2020-03-10 DIAGNOSIS — N179 Acute kidney failure, unspecified: Secondary | ICD-10-CM | POA: Diagnosis not present

## 2020-03-10 DIAGNOSIS — N189 Chronic kidney disease, unspecified: Secondary | ICD-10-CM

## 2020-03-10 MED ORDER — ERGOCALCIFEROL 50 MCG (2000 UT) PO CAPS: 1.0000 | ORAL_CAPSULE | Freq: Every day | ORAL | 1 refills | Status: DC

## 2020-03-10 MED ORDER — FUROSEMIDE 80 MG PO TABS
80.0000 mg | ORAL_TABLET | Freq: Every day | ORAL | 1 refills | Status: DC
Start: 2020-03-10 — End: 2020-03-10

## 2020-03-10 MED FILL — FUROSEMIDE 80 MG TAB: 80 | 30 days supply | Qty: 30 | Fill #0

## 2020-03-10 NOTE — Assessment & Plan Note (Addendum)
-  See documentation under essential hypertension.

## 2020-03-10 NOTE — Assessment & Plan Note (Signed)
-  Lab work done on 02/18/2020 revealed sodium of 136, potassium of 5.3, chloride 103, CO2 21, BUN of 40, serum creatinine of 3.84 (down from 5.3), blood sugar of 84, calcium of 9.5, intact PTH of 209, phosphorus of 4.8, 25-hydroxy vitamin D of 21, T sat of 19.7%, iron of 61, hemoglobin of 11.8 and UPC suggestive of 0.74 g of proteinuria in 24 hours.   -Repeat BMP today. -Patient turning Calciferol 2000 international units daily. -Renal diet.

## 2020-03-10 NOTE — Assessment & Plan Note (Signed)
-  Discussed need to comply with medication with patient. -Goal blood pressure should be less than 130/80 mmHg.

## 2020-03-10 NOTE — Progress Notes (Signed)
New Patient Office Visit  Subjective:  Patient ID: David [name], male    DOB: [birth year]  Age: 50 y.o. MRN: [id number]  CC:  Chief Complaint  Patient presents with  . New Patient (Initial Visit)    HPI Patient is a 50 year old African-American male with past medical history significant for chronic kidney disease stage IV likely secondary to uncontrolled hypertension, noncompliance, vitamin D deficiency, abdominal aortic aneurysm and proteinuria.  Patient has baseline chronic kidney disease stage IV that may have progressed to stage V.  Blood pressure remains uncontrolled.  Patient seems to be noncompliant.  Cannot rule out acute kidney injury on chronic kidney disease stage IV based on worsening serum creatinine with associated uncontrolled blood pressure.  Patient saw local nephrologist for about 6 to 8 months but is not currently following up with any nephrologist.  Patient could not give me the details relating to the circumstances surrounding his dissociation with a nephrologist.  Patient was denies history of diabetes mellitus.  Patient continues to make adequate urine.  Urine color is clear.  Patient reports foamy urine.  No leg swelling, no chest pain, no shortness of breath, no paroxysmal nocturnal dyspnea, no orthopnea, no headache, no neck pain, no GI symptoms and no joint pains.  Patient denies any skin rashes.  No prior history of vasculitis, polycystic kidney disease or glomerulonephritis.  Patient denies NSAIDs use.  Patient also denies use of over-the-counter medications or Chinese herbs.  Blood pressure on today's visit was 175/108 mmHg.    Past Medical History:  Diagnosis Date  . AAA (abdominal aortic aneurysm) ([place]) 11/2019  . Hypertension   . Proteinuria 12/2019  . Vitamin D deficiency     Past Surgical History:  Procedure Laterality Date  . none      Family History  Family history unknown: Yes    Social History   Socioeconomic History  . Marital status: Married     Spouse name: Not on file  . Number of children: 5  . Years of education: Not on file  . Highest education level: Not on file  Occupational History  . Occupation: truck Geophysicist/field seismologist  Tobacco Use  . Smoking status: Former Research scientist (life sciences)  . Smokeless tobacco: Never Used  Vaping Use  . Vaping Use: Never used  Substance and Sexual Activity  . Alcohol use: No  . Drug use: No  . Sexual activity: Not on file  Other Topics Concern  . Not on file  Social History Narrative  . Not on file   Social Determinants of Health   Financial Resource Strain: Not on file  Food Insecurity: Not on file  Transportation Needs: Not on file  Physical Activity: Not on file  Stress: Not on file  Social Connections: Not on file  Intimate Partner Violence: Not on file    ROS Review of Systems  Constitutional: Negative.   HENT: Negative.   Eyes: Negative.   Respiratory: Negative.   Cardiovascular: Negative.   Gastrointestinal: Negative.   Endocrine: Negative.   Genitourinary:       Foamy urine.  Musculoskeletal: Negative.   Skin: Negative.   Neurological: Negative.   Hematological: Negative.   Psychiatric/Behavioral: Negative.     Objective:   Today's Vitals: BP (!) 175/108   Pulse 62   Temp (!) 97.1 F (36.2 C)   Ht '5\' 9"'$  (1.753 m)   Wt 222 lb (100.7 kg)   SpO2 98%   BMI 32.78 kg/m   PHYSICAL EXAM Physical Exam Constitutional:  General: He is not in acute distress.    Appearance: He is not ill-appearing, toxic-appearing or diaphoretic.  HENT:     Head: Normocephalic and atraumatic.     Mouth/Throat:     Mouth: Mucous membranes are moist.  Eyes:     Extraocular Movements: Extraocular movements intact.     Pupils: Pupils are equal, round, and reactive to light.     Comments: Patient is pale.  Cardiovascular:     Rate and Rhythm: Normal rate and regular rhythm.     Pulses: Normal pulses.     Heart sounds: Normal heart sounds.  Pulmonary:     Effort: Pulmonary effort is normal.      Breath sounds: Normal breath sounds.  Abdominal:     General: Bowel sounds are normal.     Palpations: Abdomen is soft.  Musculoskeletal:        General: Normal range of motion.     Cervical back: Normal range of motion and neck supple.  Neurological:     General: No focal deficit present.     Mental Status: He is alert and oriented to person, place, and time. Mental status is at baseline.     Assessment & Plan:   Problem List Items Addressed This Visit      Cardiovascular and Mediastinum   Hypertensive urgency    -Blood pressure remains uncontrolled. -Clonidine 0.1 mg p.o. x1 dose given at the clinic. -Need to comply with medications discussed with the patient. -Advanced chronic kidney disease may be contributing to difficult to control hypertension. -Monitor blood pressure at home, document readings and review with the physician.        Genitourinary   Chronic kidney disease, stage IV (severe) (HCC)    -Serum creatinine as of 12/04/2019 was 4.64. -Repeat BMP done on 12/24/2019 revealed serum creatinine of 5.3.  -Patient has chronic kidney disease could have progressed to stage V versus acute kidney injury on chronic kidney disease stage IV. -We will repeat BMP today. -We will also check iron studies, intact PTH, 25-hydroxy vitamin D and phosphorus. -Optimize blood pressure control. -Monitor potassium and other electrolytes closely.          Other   History of medication noncompliance    -Discussed need to comply with medication with patient. -Goal blood pressure should be less than 130/80 mmHg.       Other Visit Diagnoses    Chronic kidney disease, stage 5 ([place])    -  Primary   Relevant Orders   BASIC METABOLIC PANEL WITH GFR (Completed)   Protein / creatinine ratio, urine (Completed)   Parathyroid hormone, intact (no Ca) (Completed)   Phosphorus (Completed)   VITAMIN D 25 Hydroxy (Vit-D Deficiency, Fractures) (Completed)      Outpatient Encounter  Medications as of 02/18/2020  Medication Sig  . amLODipine (NORVASC) 10 MG tablet Take 1 tablet (10 mg total) by mouth daily.  Marland Kitchen aspirin EC 81 MG tablet Take 1 tablet (81 mg total) by mouth daily.  . carvedilol (COREG) 25 MG tablet Take 1 tablet (25 mg total) by mouth 2 (two) times daily.  . cloNIDine (CATAPRES) 0.3 MG tablet Take 1 tablet (0.3 mg total) by mouth 3 (three) times daily.  . hydrALAZINE (APRESOLINE) 100 MG tablet Take 1 tablet (100 mg total) by mouth every 8 (eight) hours.  Marland Kitchen omeprazole (PRILOSEC) 40 MG capsule Take 1 capsule (40 mg total) by mouth 2 (two) times daily before a meal.  . docusate sodium (COLACE)  100 MG capsule Take 1 capsule (100 mg total) by mouth 2 (two) times daily. (Patient not taking: Reported on 02/18/2020)  . ferrous sulfate 325 (65 FE) MG tablet Take 1 tablet (325 mg total) by mouth daily with breakfast. (Patient not taking: Reported on 02/18/2020)  . ondansetron (ZOFRAN ODT) 4 MG disintegrating tablet '4mg'$  ODT q4 hours prn nausea/vomit (Patient not taking: No sig reported)  . [DISCONTINUED] spironolactone (ALDACTONE) 25 MG tablet Take 1 tablet (25 mg total) by mouth daily. (Patient not taking: Reported on 02/18/2020)   No facility-administered encounter medications on file as of 02/18/2020.    Follow-up: Return in about 2 weeks (around 03/03/2020).   Bonnell Public, MD

## 2020-03-10 NOTE — Assessment & Plan Note (Signed)
-  Blood pressure is not optimally controlled. -As per medical records, patient is on Norvasc 10 Mg p.o. once daily, Coreg 25 Mg p.o. twice daily, hydralazine 100 mg p.o. 3 times daily and clonidine 0.3 mg p.o. 3 times daily. -Patient has been asked to bring all his medications on next visit. -We will start patient on Lasix 80 mg p.o. once daily. -Patient has been advised to monitor blood pressure at home, document readings and review with physician.

## 2020-03-10 NOTE — Progress Notes (Signed)
Established Patient Office Visit  Subjective:  Patient ID: David [name], male    DOB: 04/04/1970  Age: 50 y.o. MRN: [id number]  CC:  Chief Complaint  Patient presents with  . Follow-up    HPI Patient is a 50 year old African-American male with past medical history significant for chronic kidney disease stage IV likely secondary to uncontrolled hypertension, noncompliance, vitamin D deficiency, abdominal aortic aneurysm and proteinuria.  Patient has come for follow-up of likely acute kidney injury on chronic kidney disease stage IV.  BMP done on last visit revealed sodium of 136, potassium of 5.2, chloride 103, CO2 of 21, BUN of 42, serum creatinine of 3.84 (down from 5.3) and blood sugar of 84.  Calcium was 9.5, UPC was indicative of 0.74 g proteinuria in 24 hours, intact PTH was 209, phosphorus was 4.8, 25-hydroxy vitamin D was 21, T sat was 19.7% and iron was 61 with hemoglobin of 11.8 g/dL.  Blood pressure remains uncontrolled.  Patient continues to make adequate urine.  Urine color is clear.  Patient reports foamy urine.  No leg swelling, no chest pain, no shortness of breath, no paroxysmal nocturnal dyspnea, no orthopnea, no headache, no neck pain, no GI symptoms and no joint pains.  Patient denies any skin rashes.  No prior history of vasculitis, polycystic kidney disease or glomerulonephritis.  Patient denies NSAIDs use.  Patient also denies use of over-the-counter medications or Chinese herbs.  Blood pressure on today's visit was 171/104 mmHg.  Available records indicate that patient's current medication is hydralazine 100 mg p.o. 3 times daily, clonidine 0.3 mg p.o. 3 times daily, Coreg 25 Mg p.o. twice daily, enteric-coated aspirin 81 Mg p.o. once daily and Norvasc 10 Mg p.o. once daily.  Patient has been advised to bring all his medications to next visit.    Past Medical History:  Diagnosis Date  . AAA (abdominal aortic aneurysm) ([place]) 11/2019  . Hypertension   . Proteinuria 12/2019   . Vitamin D deficiency     Past Surgical History:  Procedure Laterality Date  . none      Family History  Family history unknown: Yes    Social History   Socioeconomic History  . Marital status: Married    Spouse name: Not on file  . Number of children: 5  . Years of education: Not on file  . Highest education level: Not on file  Occupational History  . Occupation: truck Geophysicist/field seismologist  Tobacco Use  . Smoking status: Former Research scientist (life sciences)  . Smokeless tobacco: Never Used  Vaping Use  . Vaping Use: Never used  Substance and Sexual Activity  . Alcohol use: No  . Drug use: No  . Sexual activity: Not on file  Other Topics Concern  . Not on file  Social History Narrative  . Not on file   Social Determinants of Health   Financial Resource Strain: Not on file  Food Insecurity: Not on file  Transportation Needs: Not on file  Physical Activity: Not on file  Stress: Not on file  Social Connections: Not on file  Intimate Partner Violence: Not on file    Outpatient Medications Prior to Visit  Medication Sig Dispense Refill  . amLODipine (NORVASC) 10 MG tablet Take 1 tablet (10 mg total) by mouth daily. 90 tablet 3  . aspirin EC 81 MG tablet Take 1 tablet (81 mg total) by mouth daily. 90 tablet 2  . carvedilol (COREG) 25 MG tablet Take 1 tablet (25 mg total) by mouth 2 (two)  times daily. 180 tablet 3  . cloNIDine (CATAPRES) 0.3 MG tablet Take 1 tablet (0.3 mg total) by mouth 3 (three) times daily. 270 tablet 3  . hydrALAZINE (APRESOLINE) 100 MG tablet Take 1 tablet (100 mg total) by mouth every 8 (eight) hours. 90 tablet 3  . omeprazole (PRILOSEC) 40 MG capsule Take 1 capsule (40 mg total) by mouth 2 (two) times daily before a meal. 60 capsule 0  . docusate sodium (COLACE) 100 MG capsule Take 1 capsule (100 mg total) by mouth 2 (two) times daily. (Patient not taking: Reported on 02/18/2020) 180 capsule 3  . ferrous sulfate 325 (65 FE) MG tablet Take 1 tablet (325 mg total) by mouth daily  with breakfast. (Patient not taking: Reported on 02/18/2020) 30 tablet 1  . ondansetron (ZOFRAN ODT) 4 MG disintegrating tablet '4mg'$  ODT q4 hours prn nausea/vomit (Patient not taking: No sig reported) 10 tablet 0  . spironolactone (ALDACTONE) 25 MG tablet Take 1 tablet (25 mg total) by mouth daily. (Patient not taking: Reported on 02/18/2020) 90 tablet 3   No facility-administered medications prior to visit.    No Known Allergies  ROS Review of Systems  Constitutional: Negative.   HENT: Negative.   Eyes: Negative.   Respiratory: Negative.   Cardiovascular: Negative.   Gastrointestinal: Negative.   Endocrine: Negative.   Genitourinary: Negative.   Musculoskeletal: Negative.   Neurological: Negative.   Hematological: Negative.   Psychiatric/Behavioral: Negative.       Objective:    Physical Exam Constitutional:      General: He is not in acute distress.    Appearance: Normal appearance. He is not ill-appearing, toxic-appearing or diaphoretic.  HENT:     Head: Normocephalic and atraumatic.     Nose: Nose normal.     Mouth/Throat:     Mouth: Mucous membranes are moist.     Pharynx: Oropharynx is clear.  Eyes:     Extraocular Movements: Extraocular movements intact.     Pupils: Pupils are equal, round, and reactive to light.     Comments: Patient is mildly pale.  Cardiovascular:     Rate and Rhythm: Normal rate and regular rhythm.     Pulses: Normal pulses.     Heart sounds: Normal heart sounds.  Pulmonary:     Effort: Pulmonary effort is normal.     Breath sounds: Normal breath sounds.  Abdominal:     General: Bowel sounds are normal.     Palpations: Abdomen is soft.  Musculoskeletal:     Cervical back: Normal range of motion and neck supple.     Right lower leg: Edema present.     Left lower leg: Edema present.  Neurological:     General: No focal deficit present.     Mental Status: He is alert and oriented to person, place, and time. Mental status is at baseline.      BP (!) 163/103   Pulse 66   Temp (!) 97.1 F (36.2 C)   Ht '5\' 9"'$  (1.753 m)   Wt 224 lb (101.6 kg)   SpO2 96%   BMI 33.08 kg/m  Wt Readings from Last 3 Encounters:  03/10/20 224 lb (101.6 kg)  02/18/20 222 lb (100.7 kg)  02/05/20 215 lb (97.5 kg)     Health Maintenance Due  Topic Date Due  . Hepatitis C Screening  Never done  . COVID-19 Vaccine (1) Never done  . COLONOSCOPY (Pts 45-57yr Insurance coverage will need to be confirmed)  Never  done  . INFLUENZA VACCINE  08/11/2019    There are no preventive care reminders to display for this patient.  Lab Results  Component Value Date   TSH 1.210 12/24/2019   Lab Results  Component Value Date   WBC 3.3 (L) 12/24/2019   HGB 11.8 (L) 12/24/2019   HCT 37.7 12/24/2019   MCV 83 12/24/2019   PLT 398 12/24/2019   Lab Results  Component Value Date   NA 136 02/18/2020   K 5.2 02/18/2020   CO2 21 02/18/2020   GLUCOSE 84 02/18/2020   BUN 42 (H) 02/18/2020   CREATININE 3.84 (H) 02/18/2020   BILITOT <0.2 12/24/2019   ALKPHOS 85 12/24/2019   AST 9 12/24/2019   ALT 8 12/24/2019   PROT 6.8 12/24/2019   ALBUMIN 3.9 (L) 12/24/2019   CALCIUM 9.5 02/18/2020   ANIONGAP 16 (H) 12/04/2019   Lab Results  Component Value Date   CHOL 189 12/24/2019   Lab Results  Component Value Date   HDL 38 (L) 12/24/2019   Lab Results  Component Value Date   LDLCALC 135 (H) 12/24/2019   Lab Results  Component Value Date   TRIG 86 12/24/2019   Lab Results  Component Value Date   CHOLHDL 5.0 12/24/2019   Lab Results  Component Value Date   HGBA1C 5.2 03/07/2018      Assessment & Plan:   Problem List Items Addressed This Visit      Cardiovascular and Mediastinum   Hypertensive urgency    -See documentation under essential hypertension.        Relevant Medications   furosemide (LASIX) 80 MG tablet   Essential hypertension    -Blood pressure is not optimally controlled. -As per medical records, patient is on  Norvasc 10 Mg p.o. once daily, Coreg 25 Mg p.o. twice daily, hydralazine 100 mg p.o. 3 times daily and clonidine 0.3 mg p.o. 3 times daily. -Patient has been asked to bring all his medications on next visit. -We will start patient on Lasix 80 mg p.o. once daily. -Patient has been advised to monitor blood pressure at home, document readings and review with physician.      Relevant Medications   furosemide (LASIX) 80 MG tablet   RESOLVED: Hypertensive emergency    -See above documentation.      Relevant Medications   furosemide (LASIX) 80 MG tablet     Genitourinary   Acute kidney injury superimposed on chronic kidney disease ([place])    -Lab work done on 02/18/2020 revealed sodium of 136, potassium of 5.3, chloride 103, CO2 21, BUN of 40, serum creatinine of 3.84 (down from 5.3), blood sugar of 84, calcium of 9.5, intact PTH of 209, phosphorus of 4.8, 25-hydroxy vitamin D of 21, T sat of 19.7%, iron of 61, hemoglobin of 11.8 and UPC suggestive of 0.74 g of proteinuria in 24 hours.   -Repeat BMP today. -Patient turning Calciferol 2000 international units daily. -Renal diet.        Other   History of medication noncompliance    - Counseled.       Other Visit Diagnoses    CKD (chronic kidney disease) stage 4, GFR 15-29 ml/min (HCC)    -  Primary   Relevant Orders   BASIC METABOLIC PANEL WITH GFR      Meds ordered this encounter  Medications  . furosemide (LASIX) 80 MG tablet    Sig: Take 1 tablet (80 mg total) by mouth daily.    Dispense:  30 tablet    Refill:  1  . Ergocalciferol 50 MCG (2000 UT) CAPS    Sig: Take 1 capsule by mouth daily.    Dispense:  30 capsule    Refill:  1     Follow-up: Return in about 6 weeks (around 04/21/2020).    Bonnell Public, MD

## 2020-03-10 NOTE — Assessment & Plan Note (Signed)
-  See above documentation. 

## 2020-03-10 NOTE — Assessment & Plan Note (Signed)
Counseled

## 2020-03-10 NOTE — Assessment & Plan Note (Signed)
-  Blood pressure remains uncontrolled. -Clonidine 0.1 mg p.o. x1 dose given at the clinic. -Need to comply with medications discussed with the patient. -Advanced chronic kidney disease may be contributing to difficult to control hypertension. -Monitor blood pressure at home, document readings and review with the physician.

## 2020-03-10 NOTE — Assessment & Plan Note (Signed)
-  Serum creatinine as of 12/04/2019 was 4.64. -Repeat BMP done on 12/24/2019 revealed serum creatinine of 5.3.  -Patient has chronic kidney disease could have progressed to stage V versus acute kidney injury on chronic kidney disease stage IV. -We will repeat BMP today. -We will also check iron studies, intact PTH, 25-hydroxy vitamin D and phosphorus. -Optimize blood pressure control. -Monitor potassium and other electrolytes closely.

## 2020-03-11 LAB — BASIC METABOLIC PANEL WITH GFR
BUN/Creatinine Ratio: 11 (calc) (ref 6–22)
BUN: 46 mg/dL — ABNORMAL HIGH (ref 7–25)
CO2: 22 mmol/L (ref 20–32)
Calcium: 9.3 mg/dL (ref 8.6–10.3)
Chloride: 107 mmol/L (ref 98–110)
Creat: 4.04 mg/dL — ABNORMAL HIGH (ref 0.70–1.33)
GFR, Est African American: 19 mL/min/{1.73_m2} — ABNORMAL LOW (ref >=60)
GFR, Est Non African American: 16 mL/min/{1.73_m2} — ABNORMAL LOW (ref >=60)
Glucose, Bld: 84 mg/dL (ref 65–99)
Potassium: 5.6 mmol/L — ABNORMAL HIGH (ref 3.5–5.3)
Sodium: 140 mmol/L (ref 135–146)

## 2020-04-21 ENCOUNTER — Ambulatory Visit: Payer: BC Managed Care – PPO | Admitting: Internal Medicine

## 2020-04-22 ENCOUNTER — Ambulatory Visit: Payer: Self-pay | Admitting: Family Medicine

## 2020-04-28 ENCOUNTER — Other Ambulatory Visit: Payer: Self-pay | Admitting: Family Medicine

## 2020-04-28 ENCOUNTER — Other Ambulatory Visit: Payer: Self-pay

## 2020-04-28 DIAGNOSIS — I10 Essential (primary) hypertension: Secondary | ICD-10-CM

## 2020-04-28 MED FILL — Furosemide Tab 80 MG: ORAL | 30 days supply | Qty: 30 | Fill #0 | Status: AC

## 2020-04-28 MED FILL — Amlodipine Besylate Tab 10 MG (Base Equivalent): ORAL | 90 days supply | Qty: 90 | Fill #0 | Status: AC

## 2020-04-29 ENCOUNTER — Ambulatory Visit (INDEPENDENT_AMBULATORY_CARE_PROVIDER_SITE_OTHER): Payer: BC Managed Care – PPO | Admitting: Nurse Practitioner

## 2020-04-29 ENCOUNTER — Other Ambulatory Visit: Payer: Self-pay

## 2020-04-29 ENCOUNTER — Encounter: Payer: Self-pay | Admitting: Nurse Practitioner

## 2020-04-29 VITALS — BP 157/101 | HR 61 | Temp 98.5°F | Ht 69.0 in | Wt 217.2 lb

## 2020-04-29 DIAGNOSIS — I10 Essential (primary) hypertension: Secondary | ICD-10-CM | POA: Diagnosis not present

## 2020-04-29 DIAGNOSIS — I160 Hypertensive urgency: Secondary | ICD-10-CM

## 2020-04-29 LAB — POCT URINALYSIS DIPSTICK
Bilirubin, UA: NEGATIVE
Blood, UA: NEGATIVE
Glucose, UA: NEGATIVE
Ketones, UA: NEGATIVE
Leukocytes, UA: NEGATIVE
Nitrite, UA: NEGATIVE
Protein, UA: NEGATIVE
Spec Grav, UA: >=1.03 — AB (ref 1.010–1.025)
Urobilinogen, UA: 0.2 E.U./dL
pH, UA: 6 (ref 5.0–8.0)

## 2020-04-29 NOTE — Patient Instructions (Addendum)
Managing Your Hypertension Hypertension, also called high blood pressure, is when the force of the blood pressing against the walls of the arteries is too strong. Arteries are blood vessels that carry blood from your heart throughout your body. Hypertension forces the heart to work harder to pump blood and may cause the arteries to become narrow or stiff. Understanding blood pressure readings Your personal target blood pressure may vary depending on your medical conditions, your age, and other factors. A blood pressure reading includes a higher number over a lower number. Ideally, your blood pressure should be below 120/80. You should know that:  The first, or top, number is called the systolic pressure. It is a measure of the pressure in your arteries as your heart beats.  The second, or bottom number, is called the diastolic pressure. It is a measure of the pressure in your arteries as the heart relaxes. Blood pressure is classified into four stages. Based on your blood pressure reading, your health care provider may use the following stages to determine what type of treatment you need, if any. Systolic pressure and diastolic pressure are measured in a unit called mmHg. Normal  Systolic pressure: below 120.  Diastolic pressure: below 80. Elevated  Systolic pressure: 120-129.  Diastolic pressure: below 80. Hypertension stage 1  Systolic pressure: 130-139.  Diastolic pressure: 80-89. Hypertension stage 2  Systolic pressure: 140 or above.  Diastolic pressure: 90 or above. How can this condition affect me? Managing your hypertension is an important responsibility. Over time, hypertension can damage the arteries and decrease blood flow to important parts of the body, including the brain, heart, and kidneys. Having untreated or uncontrolled hypertension can lead to:  A heart attack.  A stroke.  A weakened blood vessel (aneurysm).  Heart failure.  Kidney damage.  Eye  damage.  Metabolic syndrome.  Memory and concentration problems.  Vascular dementia. What actions can I take to manage this condition? Hypertension can be managed by making lifestyle changes and possibly by taking medicines. Your health care provider will help you make a plan to bring your blood pressure within a normal range. Nutrition  Eat a diet that is high in fiber and potassium, and low in salt (sodium), added sugar, and fat. An example eating plan is called the Dietary Approaches to Stop Hypertension (DASH) diet. To eat this way: ? Eat plenty of fresh fruits and vegetables. Try to fill one-half of your plate at each meal with fruits and vegetables. ? Eat whole grains, such as whole-wheat pasta, brown rice, or whole-grain bread. Fill about one-fourth of your plate with whole grains. ? Eat low-fat dairy products. ? Avoid fatty cuts of meat, processed or cured meats, and poultry with skin. Fill about one-fourth of your plate with lean proteins such as fish, chicken without skin, beans, eggs, and tofu. ? Avoid pre-made and processed foods. These tend to be higher in sodium, added sugar, and fat.  Reduce your daily sodium intake. Most people with hypertension should eat less than 1,500 mg of sodium a day.   Lifestyle  Work with your health care provider to maintain a healthy body weight or to lose weight. Ask what an ideal weight is for you.  Get at least 30 minutes of exercise that causes your heart to beat faster (aerobic exercise) most days of the week. Activities may include walking, swimming, or biking.  Include exercise to strengthen your muscles (resistance exercise), such as weight lifting, as part of your weekly exercise routine. Try   to do these types of exercises for 30 minutes at least 3 days a week.  Do not use any products that contain nicotine or tobacco, such as cigarettes, e-cigarettes, and chewing tobacco. If you need help quitting, ask your health care  provider.  Control any long-term (chronic) conditions you have, such as high cholesterol or diabetes.  Identify your sources of stress and find ways to manage stress. This may include meditation, deep breathing, or making time for fun activities.   Alcohol use  Do not drink alcohol if: ? Your health care provider tells you not to drink. ? You are pregnant, may be pregnant, or are planning to become pregnant.  If you drink alcohol: ? Limit how much you use to:  0-1 drink a day for women.  0-2 drinks a day for men. ? Be aware of how much alcohol is in your drink. In the [place] one drink equals one 12 oz bottle of beer (355 mL), one 5 oz glass of wine (148 mL), or one 1 oz glass of hard liquor (44 mL). Medicines Your health care provider may prescribe medicine if lifestyle changes are not enough to get your blood pressure under control and if:  Your systolic blood pressure is 130 or higher.  Your diastolic blood pressure is 80 or higher. Take medicines only as told by your health care provider. Follow the directions carefully. Blood pressure medicines must be taken as told by your health care provider. The medicine does not work as well when you skip doses. Skipping doses also puts you at risk for problems. Monitoring Before you monitor your blood pressure:  Do not smoke, drink caffeinated beverages, or exercise within 30 minutes before taking a measurement.  Use the bathroom and empty your bladder (urinate).  Sit quietly for at least 5 minutes before taking measurements. Monitor your blood pressure at home as told by your health care provider. To do this:  Sit with your back straight and supported.  Place your feet flat on the floor. Do not cross your legs.  Support your arm on a flat surface, such as a table. Make sure your upper arm is at heart level.  Each time you measure, take two or three readings one minute apart and record the results. You may also need to have your  blood pressure checked regularly by your health care provider.   General information  Talk with your health care provider about your diet, exercise habits, and other lifestyle factors that may be contributing to hypertension.  Review all the medicines you take with your health care provider because there may be side effects or interactions.  Keep all visits as told by your health care provider. Your health care provider can help you create and adjust your plan for managing your high blood pressure. Where to find more information  National Heart, Lung, and Blood Institute: www.nhlbi.nih.gov  American Heart Association: www.heart.org Contact a health care provider if:  You think you are having a reaction to medicines you have taken.  You have repeated (recurrent) headaches.  You feel dizzy.  You have swelling in your ankles.  You have trouble with your vision. Get help right away if:  You develop a severe headache or confusion.  You have unusual weakness or numbness, or you feel faint.  You have severe pain in your chest or abdomen.  You vomit repeatedly.  You have trouble breathing. These symptoms may represent a serious problem that is an emergency. Do not wait   to see if the symptoms will go away. Get medical help right away. Call your local emergency services (911 in the [place] Do not drive yourself to the hospital. Summary  Hypertension is when the force of blood pumping through your arteries is too strong. If this condition is not controlled, it may put you at risk for serious complications.  Your personal target blood pressure may vary depending on your medical conditions, your age, and other factors. For most people, a normal blood pressure is less than 120/80.  Hypertension is managed by lifestyle changes, medicines, or both.  Lifestyle changes to help manage hypertension include losing weight, eating a healthy, low-sodium diet, exercising more, stopping smoking, and  limiting alcohol. This information is not intended to replace advice given to you by your health care provider. Make sure you discuss any questions you have with your health care provider. Document Revised: 02/01/2019 Document Reviewed: 11/27/2018 Elsevier Patient Education  2021 [place].   Chronic Kidney Disease, Adult Chronic kidney disease is when lasting damage happens to the kidneys slowly over a long time. The kidneys help to:  Make pee (urine).  Make hormones.  Keep the right amount of fluids and chemicals in the body. Most often, this disease does not go away. You must take steps to help keep the kidney damage from getting worse. If steps are not taken, the kidneys might stop working forever. What are the causes?  Diabetes.  High blood pressure.  Diseases that affect the heart and blood vessels.  Other kidney diseases.  Diseases of the body's disease-fighting system.  A problem with the flow of pee.  Infections of the organs that make pee, store it, and take it out of the body.  Swelling or irritation of your blood vessels. What increases the risk?  Getting older.  Having someone in your family who has kidney disease or kidney failure.  Having a disease caused by genes.  Taking medicines often that harm the kidneys.  Being near or having contact with harmful substances.  Being very overweight.  Using tobacco now or in the past. What are the signs or symptoms?  Feeling very tired.  Having a swollen face, legs, ankles, or feet.  Feeling like you may vomit or vomiting.  Not feeling hungry.  Being confused or not able to focus.  Twitches and cramps in the leg muscles or other muscles.  Dry, itchy skin.  A taste of metal in your mouth.  Making less pee, or making more pee.  Shortness of breath.  Trouble sleeping. You may also become anemic or get weak bones. Anemic means there is not enough red blood cells or hemoglobin in your  blood. You may get symptoms slowly. You may not notice them until the kidney damage gets very bad. How is this treated? Often, there is no cure for this disease. Treatment can help with symptoms and help keep the disease from getting worse. You may need to:  Avoid alcohol.  Avoid foods that are high in salt, potassium, phosphorous, and protein.  Take medicines for symptoms and to help control other conditions.  Have dialysis. This treatment gets harmful waste out of your body.  Treat other problems that cause your kidney disease or make it worse. Follow these instructions at home: Medicines  Take over-the-counter and prescription medicines only as told by your doctor.  Do not take any new medicines, vitamins, or supplements unless your doctor says it is okay. Lifestyle  Do not smoke or use  any products that contain nicotine or tobacco. If you need help quitting, ask your doctor.  If you drink alcohol: ? Limit how much you use to:  0-1 drink a day for women who are not pregnant.  0-2 drinks a day for men. ? Know how much alcohol is in your drink. In the [place] one drink equals one 12 oz bottle of beer (355 mL), one 5 oz glass of wine (148 mL), or one 1 oz glass of hard liquor (44 mL).  Stay at a healthy weight. If you need help losing weight, ask your doctor.   General instructions  Follow instructions from your doctor about what you cannot eat or drink.  Track your blood pressure at home. Tell your doctor about any changes.  If you have diabetes, track your blood sugar.  Exercise at least 30 minutes a day, 5 days a week.  Keep your shots (vaccinations) up to date.  Keep all follow-up visits.   Where to find more information  American Association of Kidney Patients: BombTimer.gl  National Kidney Foundation: www.kidney.[place]: https://mathis.com/  Life Options: www.lifeoptions.org  Kidney [employer]: www.kidneyschool.org Contact a doctor if:  Your  symptoms get worse.  You get new symptoms. Get help right away if:  You get symptoms of end-stage kidney disease. These include: ? Headaches. ? Losing feeling in your hands or feet. ? Easy bruising. ? Having hiccups often. ? Chest pain. ? Shortness of breath. ? Lack of menstrual periods, in women.  You have a fever.  You make less pee than normal.  You have pain or you bleed when you pee or poop. These symptoms may be an emergency. Get help right away. Call your local emergency services (911 in the [place]  Do not wait to see if the symptoms will go away.  Do not drive yourself to the hospital. Summary  Chronic kidney disease is when lasting damage happens to the kidneys slowly over a long time.  Causes of this disease include diabetes and high blood pressure.  Often, there is no cure for this disease. Treatment can help symptoms and help keep the disease from getting worse.  Treatment may involve lifestyle changes, medicines, and dialysis. This information is not intended to replace advice given to you by your health care provider. Make sure you discuss any questions you have with your health care provider. Document Revised: 04/03/2019 Document Reviewed: 04/03/2019 Elsevier Patient Education  2021 [place].   Colonoscopy, Adult A colonoscopy is an exam to look at the large intestine. It is done using a long, thin, flexible tube that has a camera on the end. This exam is done to check for problems, such as:  Lumps (tumors).  Growths (polyps).  Irritation and swelling (inflammation).  Bleeding. Tell your doctor about:  Any allergies you have.  All medicines you are taking. Tell him or her about vitamins, herbs, eye drops, creams, and over-the-counter medicines.  Any problems you or family members have had with anesthetic medicines.  Any blood disorders you have.  Any surgeries you have had.  Any medical conditions you have.  Whether you are pregnant  or may be pregnant.  Any problems you have had with pooping (having bowel movements). What are the risks? Generally, this is a safe procedure. However, problems may occur, such as:  Bleeding.  Damage to your intestine.  Allergic reactions to medicines given during the procedure.  Infection. This is rare. What happens before the procedure? Eating and drinking  Follow instructions from your doctor about eating and drinking. These may include:  A few days before the procedure: ? Follow a low-fiber diet. ? Avoid these foods:  Nuts.  Seeds.  Dried fruit.  Raw fruits.  Vegetables.  1-3 days before the procedure: ? Eat only gelatin dessert or ice pops. ? Drink only clear liquids, such as:  Water.  Clear broth or bouillon.  Black coffee or tea.  Clear juice.  Clear soft drinks or sports drinks. ? Avoid liquids that have red or purple dye.  On the day of the procedure: ? Do not eat solid foods. You may continue to drink clear liquids until up to 2 hours before the procedure. ? Do not eat or drink anything starting 2 hours before the procedure or as told by your doctor. Bowel prep If you were prescribed a bowel prep to take by mouth (orally) to clean out your colon:  Take it as told by your doctor. Starting the day before your procedure, you will need to drink a lot of liquid medicine. The liquid will cause you to poop until your poop is almost clear or light green.  If your skin or butt gets irritated from diarrhea, you may: ? Wipe the area with wipes that have medicine in them, such as adult wet wipes with aloe and vitamin E. ? Put something on your skin that soothes the area, such as petroleum jelly.  If you vomit while drinking the bowel prep: ? Take a break for up to 60 minutes. ? Begin the bowel prep again. ? Call your doctor if you keep vomiting and you cannot take the bowel prep without vomiting.  To clean out your colon, you may also be given: ? Laxative  medicines. These help you poop. ? Instructions for using a liquid medicine (enema) injected into your butt. Medicines Ask your doctor about:  Changing or stopping your normal medicines. This is important.  Taking aspirin and ibuprofen. Do not take these medicines unless your doctor tells you to take them.  Taking over-the-counter medicines, vitamins, herbs, and supplements. General instructions  Ask your doctor what steps will be taken to help prevent the spread of germs. These may include washing skin with a germ-killing soap.  Plan to have someone take you home from the hospital or clinic. What happens during the procedure?  An IV tube may be put into one of your veins.  You may be given one or more of the following: ? A medicine to help you relax (sedative). ? A medicine to numb the area (local anesthetic). ? A medicine to make you fall asleep (general anesthetic). This is rarely needed.  You will lie on your side with your knees bent.  The tube will: ? Have oil or gel put on it. ? Be put into the opening of the butt (anus). ? Be gently put into your large intestine.  Air will be sent into your colon to keep it open. You may feel some pressure or cramping.  The camera will be used to take photos that will appear on a screen.  A small tissue sample may be removed for testing (biopsy).  If small growths are found, your doctor may remove them and have them checked for cancer.  The tube will be slowly removed. The procedure may vary among doctors and hospitals.   What happens after the procedure?  You will be monitored until you leave the hospital or clinic. This includes checking your: ? Blood  pressure. ? Heart rate. ? Breathing rate. ? Blood oxygen level.  You may have a small amount of blood in your poop.  You may pass gas.  You may have mild cramps or bloating in your belly (abdomen).  Do not drive for 24 hours after the procedure.  It is up to you to get  the results of your procedure. Ask your doctor, or the department that is doing the procedure, when your results will be ready. Summary  A colonoscopy is an exam to look at the large intestine.  Follow instructions from your doctor about eating and drinking before the procedure.  You may be prescribed an oral bowel prep to clean out your colon. Take it as told by your doctor.  A flexible tube with a camera on its end will be put into the opening of the butt. It will be passed into the large intestine.  You will be monitored until you leave the hospital or clinic. This information is not intended to replace advice given to you by your health care provider. Make sure you discuss any questions you have with your health care provider. Document Revised: 07/20/2018 Document Reviewed: 07/20/2018 Elsevier Patient Education  [place].

## 2020-04-29 NOTE — Progress Notes (Signed)
[place], [place]  [address] Phone:  [phone]   Fax:  [phone]   Established Patient Office Visit  Subjective:  Patient ID: David [name], male    DOB: 05/30/1970  Age: 50 y.o. MRN: [id number]  CC:  Chief Complaint  Patient presents with  . Follow-up    Follow up  htn , 3 month follow up , pt is fasting today , pt no new concerns     HPI David [name] presents for follow-up.  He is a previous patient of NP Stroud.  He  has a past medical history of AAA (abdominal aortic aneurysm) ([place]) (11/2019), Hypertension, Proteinuria (12/2019), and Vitamin D deficiency.   Hypertension Patient is here for follow-up of elevated blood pressure. He is not exercising.  He works full-time as a Administrator.  He is currently observing Ramadan.  He has reduced his 3 times daily medication to twice daily.  He has discontinued the hydralazine due to side effects. He is adherent to a low-salt diet. Blood pressure is not well controlled at home.  150s to 160s over upper 90s to 100s cardiac symptoms: none. Patient denies chest pain, chest pressure/discomfort, claudication, dyspnea, exertional chest pressure/discomfort, fatigue, irregular heart beat, lower extremity edema, near-syncope, orthopnea, palpitations, paroxysmal nocturnal dyspnea, syncope and tachypnea. Cardiovascular risk factors: family history of premature cardiovascular disease, hypertension, male gender and obesity (BMI >= 30 kg/m2). Use of agents associated with hypertension: none. History of target organ damage: chronic kidney disease.  He admits that he has been a patient here for approximately 2 years.  He previously resided in [place].  When he moved here he did not seek treatment for his hypertension until at least 1 year after.  He reports being on 1 medication in [place] that was effective for controlling his hypertension.  After further evaluation.  He admits that his mother father and older  brother all died of heart related disease.  Ages are unspecified.  He is currently being followed by nephrology and has been diagnosed with stage V kidney disease and blood pressure is still not controlled.  He brought in a bottle of spironolactone requesting refill.  He denies any peripheral edema.  Past Medical History:  Diagnosis Date  . AAA (abdominal aortic aneurysm) ([place]) 11/2019  . Hypertension   . Proteinuria 12/2019  . Vitamin D deficiency     Past Surgical History:  Procedure Laterality Date  . none      Family History  Family history unknown: Yes    Social History   Socioeconomic History  . Marital status: Married    Spouse name: Not on file  . Number of children: 5  . Years of education: Not on file  . Highest education level: Not on file  Occupational History  . Occupation: truck Geophysicist/field seismologist  Tobacco Use  . Smoking status: Former Research scientist (life sciences)  . Smokeless tobacco: Never Used  Vaping Use  . Vaping Use: Never used  Substance and Sexual Activity  . Alcohol use: No  . Drug use: No  . Sexual activity: Not on file  Other Topics Concern  . Not on file  Social History Narrative  . Not on file   Social Determinants of Health   Financial Resource Strain: Not on file  Food Insecurity: Not on file  Transportation Needs: Not on file  Physical Activity: Not on file  Stress: Not on file  Social Connections: Not on file  Intimate Partner  Violence: Not on file    Outpatient Medications Prior to Visit  Medication Sig Dispense Refill  . amLODipine (NORVASC) 10 MG tablet TAKE 1 TABLET (10 MG TOTAL) BY MOUTH DAILY. 90 tablet 3  . carvedilol (COREG) 25 MG tablet TAKE 1 TABLET (25 MG TOTAL) BY MOUTH 2 (TWO) TIMES DAILY. 180 tablet 3  . cloNIDine (CATAPRES) 0.3 MG tablet Take 1 tablet (0.3 mg total) by mouth 3 (three) times daily. 270 tablet 3  . Cholecalciferol (VITAMIN D3) 50 MCG (2000 UT) TABS TAKE 1 CAPSULE BY MOUTH DAILY. (Patient not taking: Reported on 04/29/2020) 30  tablet 1  . docusate sodium (COLACE) 100 MG capsule Take 1 capsule (100 mg total) by mouth 2 (two) times daily. (Patient not taking: No sig reported) 180 capsule 3  . Ergocalciferol 50 MCG (2000 UT) CAPS Take 1 capsule by mouth daily. (Patient not taking: Reported on 04/29/2020) 30 capsule 1  . ferrous sulfate 325 (65 FE) MG tablet TAKE 1 TABLET (325 MG TOTAL) BY MOUTH DAILY WITH BREAKFAST. (Patient not taking: No sig reported) 30 tablet 1  . furosemide (LASIX) 80 MG tablet TAKE 1 TABLET (80 MG TOTAL) BY MOUTH DAILY. (Patient not taking: Reported on 04/29/2020) 30 tablet 1  . hydrALAZINE (APRESOLINE) 100 MG tablet TAKE 1 TABLET (100 MG TOTAL) BY MOUTH EVERY 8 (EIGHT) HOURS. (Patient not taking: Reported on 04/29/2020) 90 tablet 3  . aspirin EC 81 MG tablet Take 1 tablet (81 mg total) by mouth daily. 90 tablet 2  . omeprazole (PRILOSEC) 40 MG capsule Take 1 capsule (40 mg total) by mouth 2 (two) times daily before a meal. (Patient not taking: Reported on 04/29/2020) 60 capsule 0  . ondansetron (ZOFRAN ODT) 4 MG disintegrating tablet '4mg'$  ODT q4 hours prn nausea/vomit (Patient not taking: No sig reported) 10 tablet 0   No facility-administered medications prior to visit.    No Known Allergies  ROS Review of Systems    Objective:    Physical Exam Constitutional:      General: He is not in acute distress.    Appearance: He is obese. He is not ill-appearing, toxic-appearing or diaphoretic.  HENT:     Head: Normocephalic and atraumatic.     Mouth/Throat:     Mouth: Mucous membranes are moist.  Cardiovascular:     Rate and Rhythm: Normal rate and regular rhythm.     Pulses: Normal pulses.     Heart sounds: Normal heart sounds.  Pulmonary:     Effort: Pulmonary effort is normal.     Breath sounds: Normal breath sounds.  Abdominal:     Palpations: Abdomen is soft.  Musculoskeletal:        General: Normal range of motion.     Cervical back: Normal range of motion.  Skin:    General: Skin  is dry.  Neurological:     General: No focal deficit present.     Mental Status: He is alert and oriented to person, place, and time.  Psychiatric:        Mood and Affect: Mood normal.        Behavior: Behavior normal.        Thought Content: Thought content normal.        Judgment: Judgment normal.     BP (!) 157/101 (BP Location: Left Arm, Patient Position: Sitting, Cuff Size: Normal)   Pulse 61   Temp 98.5 F (36.9 C) (Temporal)   Ht '5\' 9"'$  (1.753 m)   Wt 217  lb 3.2 oz (98.5 kg)   SpO2 99%   BMI 32.07 kg/m  Wt Readings from Last 3 Encounters:  04/29/20 217 lb 3.2 oz (98.5 kg)  03/10/20 224 lb (101.6 kg)  02/18/20 222 lb (100.7 kg)     Health Maintenance Due  Topic Date Due  . Hepatitis C Screening  Never done  . COVID-19 Vaccine (1) Never done  . COLONOSCOPY (Pts 45-7yr Insurance coverage will need to be confirmed)  Never done    There are no preventive care reminders to display for this patient.  Lab Results  Component Value Date   TSH 1.210 12/24/2019   Lab Results  Component Value Date   WBC 3.3 (L) 12/24/2019   HGB 11.8 (L) 12/24/2019   HCT 37.7 12/24/2019   MCV 83 12/24/2019   PLT 398 12/24/2019   Lab Results  Component Value Date   NA 140 03/10/2020   K 5.6 (H) 03/10/2020   CO2 22 03/10/2020   GLUCOSE 84 03/10/2020   BUN 46 (H) 03/10/2020   CREATININE 4.04 (H) 03/10/2020   BILITOT <0.2 12/24/2019   ALKPHOS 85 12/24/2019   AST 9 12/24/2019   ALT 8 12/24/2019   PROT 6.8 12/24/2019   ALBUMIN 3.9 (L) 12/24/2019   CALCIUM 9.3 03/10/2020   ANIONGAP 16 (H) 12/04/2019   Lab Results  Component Value Date   CHOL 189 12/24/2019   Lab Results  Component Value Date   HDL 38 (L) 12/24/2019   Lab Results  Component Value Date   LDLCALC 135 (H) 12/24/2019   Lab Results  Component Value Date   TRIG 86 12/24/2019   Lab Results  Component Value Date   CHOLHDL 5.0 12/24/2019   Lab Results  Component Value Date   HGBA1C 5.2 03/07/2018       Assessment & Plan:   Problem List Items Addressed This Visit      Cardiovascular and Mediastinum   Essential hypertension Patient to return to office in 2 weeks for further evaluation unless he has been seen by cardiology Encouraged on going compliance with current medication regimen Encouraged home monitoring and recording BP <130/80 Eating a heart-healthy diet with less salt Encouraged regular physical activity  Recommend Weight loss   Relevant Orders   POCT Urinalysis Dipstick (Completed)   Ambulatory referral to Cardiology   Comp. Metabolic Panel (12)   Hypertensive urgency - Primary Declined clonidine therapy due to observance of Ramadan Agree to cardiology referral   Relevant Orders   Ambulatory referral to Cardiology      No orders of the defined types were placed in this encounter.   Follow-up: Return in about 2 weeks (around 05/13/2020).    CVevelyn Francois NP

## 2020-05-06 ENCOUNTER — Other Ambulatory Visit: Payer: Self-pay

## 2020-05-06 ENCOUNTER — Encounter: Payer: BC Managed Care – PPO | Admitting: Cardiovascular Disease

## 2020-05-06 NOTE — Progress Notes (Signed)
This encounter was created in error - please disregard.

## 2020-05-14 ENCOUNTER — Other Ambulatory Visit: Payer: Self-pay

## 2020-05-14 ENCOUNTER — Encounter: Payer: Self-pay | Admitting: Nurse Practitioner

## 2020-05-14 ENCOUNTER — Ambulatory Visit (INDEPENDENT_AMBULATORY_CARE_PROVIDER_SITE_OTHER): Payer: BC Managed Care – PPO | Admitting: Nurse Practitioner

## 2020-05-14 VITALS — BP 124/92 | HR 71 | Temp 98.4°F | Ht 69.0 in | Wt 221.0 lb

## 2020-05-14 DIAGNOSIS — I10 Essential (primary) hypertension: Secondary | ICD-10-CM

## 2020-05-14 DIAGNOSIS — I714 Abdominal aortic aneurysm, without rupture, unspecified: Secondary | ICD-10-CM

## 2020-05-14 DIAGNOSIS — N184 Chronic kidney disease, stage 4 (severe): Secondary | ICD-10-CM | POA: Diagnosis not present

## 2020-05-14 DIAGNOSIS — Z1159 Encounter for screening for other viral diseases: Secondary | ICD-10-CM | POA: Diagnosis not present

## 2020-05-14 DIAGNOSIS — D649 Anemia, unspecified: Secondary | ICD-10-CM

## 2020-05-14 NOTE — Progress Notes (Signed)
[place], [place]  [address] Phone:  [phone]   Fax:  [phone]   Established Patient Office Visit  Subjective:  Patient ID: David [name], male    DOB: 1970/05/13  Age: 50 y.o. MRN: [id number]  CC:  Chief Complaint  Patient presents with  . Follow-up    Follow up for medication check , hbp     HPI David [name] presents for follow up. He  has a past medical history of AAA (abdominal aortic aneurysm) ([place]) (11/2019), Hypertension, Proteinuria (12/2019), and Vitamin D deficiency. ;  Hypertension Patient is here for follow-up of elevated blood pressure. He is exercising and is adherent to a low-salt diet. Blood pressure is not well controlled at home. 136/98 Cardiac symptoms: none. Patient denies chest pain, chest pressure/discomfort, claudication, dyspnea, exertional chest pressure/discomfort, fatigue, irregular heart beat, lower extremity edema, near-syncope, orthopnea, palpitations, paroxysmal nocturnal dyspnea, syncope, tachypnea and headache or dizziness. Cardiovascular risk factors: family history of premature cardiovascular disease, hypertension, male gender, obesity (BMI >= 30 kg/m2) and sedentary lifestyle. Use of agents associated with hypertension: none. History of target organ damage: angina/ prior myocardial infarction and chronic kidney disease.  He is taking amlodipine 10 mg daily, Carvedilol 25 mg BID, furosemide 80 mg daily and hydralazine 100 mg twice daily. The nephrologist stared him on the furosemide 80 mg daily in March.    Past Medical History:  Diagnosis Date  . AAA (abdominal aortic aneurysm) ([place]) 11/2019  . Hypertension   . Proteinuria 12/2019  . Vitamin D deficiency     Past Surgical History:  Procedure Laterality Date  . none      Family History  Family history unknown: Yes    Social History   Socioeconomic History  . Marital status: Married    Spouse name: Not on file  . Number of children: 5   . Years of education: Not on file  . Highest education level: Not on file  Occupational History  . Occupation: truck Geophysicist/field seismologist  Tobacco Use  . Smoking status: Former Research scientist (life sciences)  . Smokeless tobacco: Never Used  Vaping Use  . Vaping Use: Never used  Substance and Sexual Activity  . Alcohol use: No  . Drug use: No  . Sexual activity: Not on file  Other Topics Concern  . Not on file  Social History Narrative  . Not on file   Social Determinants of Health   Financial Resource Strain: Not on file  Food Insecurity: Not on file  Transportation Needs: Not on file  Physical Activity: Not on file  Stress: Not on file  Social Connections: Not on file  Intimate Partner Violence: Not on file    Outpatient Medications Prior to Visit  Medication Sig Dispense Refill  . amLODipine (NORVASC) 10 MG tablet TAKE 1 TABLET (10 MG TOTAL) BY MOUTH DAILY. 90 tablet 3  . carvedilol (COREG) 25 MG tablet TAKE 1 TABLET (25 MG TOTAL) BY MOUTH 2 (TWO) TIMES DAILY. 180 tablet 3  . furosemide (LASIX) 80 MG tablet TAKE 1 TABLET (80 MG TOTAL) BY MOUTH DAILY. 30 tablet 1  . hydrALAZINE (APRESOLINE) 100 MG tablet TAKE 1 TABLET (100 MG TOTAL) BY MOUTH EVERY 8 (EIGHT) HOURS. 90 tablet 3  . Cholecalciferol (VITAMIN D3) 50 MCG (2000 UT) TABS TAKE 1 CAPSULE BY MOUTH DAILY. (Patient not taking: Reported on 04/29/2020) 30 tablet 1  . cloNIDine (CATAPRES) 0.3 MG tablet Take 1 tablet (0.3 mg total) by mouth 3 (three)  times daily. 270 tablet 3  . docusate sodium (COLACE) 100 MG capsule Take 1 capsule (100 mg total) by mouth 2 (two) times daily. (Patient not taking: No sig reported) 180 capsule 3  . Ergocalciferol 50 MCG (2000 UT) CAPS Take 1 capsule by mouth daily. (Patient not taking: Reported on 04/29/2020) 30 capsule 1  . ferrous sulfate 325 (65 FE) MG tablet TAKE 1 TABLET (325 MG TOTAL) BY MOUTH DAILY WITH BREAKFAST. (Patient not taking: No sig reported) 30 tablet 1   No facility-administered medications prior to visit.     No Known Allergies  ROS Review of Systems    Objective:    Physical Exam Constitutional:      General: He is not in acute distress. HENT:     Head: Normocephalic and atraumatic.     Nose: Nose normal.     Mouth/Throat:     Mouth: Mucous membranes are moist.  Cardiovascular:     Rate and Rhythm: Normal rate and regular rhythm.     Pulses: Normal pulses.     Heart sounds: Normal heart sounds.  Pulmonary:     Effort: Pulmonary effort is normal.     Breath sounds: Normal breath sounds.  Musculoskeletal:        General: Normal range of motion.     Cervical back: Normal range of motion.     Right lower leg: No edema.     Left lower leg: No edema.  Skin:    General: Skin is warm and dry.     Capillary Refill: Capillary refill takes less than 2 seconds.  Neurological:     General: No focal deficit present.     Mental Status: He is alert and oriented to person, place, and time.  Psychiatric:        Mood and Affect: Mood normal.        Behavior: Behavior normal.        Thought Content: Thought content normal.        Judgment: Judgment normal.     BP (!) 124/92 (BP Location: Left Arm, Patient Position: Sitting, Cuff Size: Large)   Pulse 71   Temp 98.4 F (36.9 C) (Temporal)   Ht '5\' 9"'$  (1.753 m)   Wt 221 lb (100.2 kg)   SpO2 98%   BMI 32.64 kg/m  Wt Readings from Last 3 Encounters:  05/14/20 221 lb (100.2 kg)  04/29/20 217 lb 3.2 oz (98.5 kg)  03/10/20 224 lb (101.6 kg)     Health Maintenance Due  Topic Date Due  . COVID-19 Vaccine (1) Never done    There are no preventive care reminders to display for this patient.  Lab Results  Component Value Date   TSH 1.210 12/24/2019   Lab Results  Component Value Date   WBC 3.3 (L) 12/24/2019   HGB 11.8 (L) 12/24/2019   HCT 37.7 12/24/2019   MCV 83 12/24/2019   PLT 398 12/24/2019   Lab Results  Component Value Date   NA 140 03/10/2020   K 5.6 (H) 03/10/2020   CO2 22 03/10/2020   GLUCOSE 84 03/10/2020    BUN 46 (H) 03/10/2020   CREATININE 4.04 (H) 03/10/2020   BILITOT <0.2 12/24/2019   ALKPHOS 85 12/24/2019   AST 9 12/24/2019   ALT 8 12/24/2019   PROT 6.8 12/24/2019   ALBUMIN 3.9 (L) 12/24/2019   CALCIUM 9.3 03/10/2020   ANIONGAP 16 (H) 12/04/2019   Lab Results  Component Value Date   CHOL  189 12/24/2019   Lab Results  Component Value Date   HDL 38 (L) 12/24/2019   Lab Results  Component Value Date   LDLCALC 135 (H) 12/24/2019   Lab Results  Component Value Date   TRIG 86 12/24/2019   Lab Results  Component Value Date   CHOLHDL 5.0 12/24/2019   Lab Results  Component Value Date   HGBA1C 5.2 03/07/2018      Assessment & Plan:   Problem List Items Addressed This Visit      Cardiovascular and Mediastinum   AAA (abdominal aortic aneurysm) without rupture ([place]) - Primary   Essential hypertension Stable today Concern about the furosemide and his current renal function    Other Visit Diagnoses    Encounter for hepatitis C screening test for low risk patient       Relevant Orders   Hepatitis C antibody   CKD (chronic kidney disease) stage 4, GFR 15-29 ml/min (HCC)     Continue to follow up with nephrology for management of furosemide  He is to call and reschedule apt with nephrology missed last follow.    Anemia, unspecified type          No orders of the defined types were placed in this encounter.   Follow-up: Return in about 6 weeks (around 06/25/2020) for Follow up HTN [id number].    Vevelyn Francois, NP

## 2020-05-14 NOTE — Patient Instructions (Addendum)
Managing Your Hypertension Hypertension, also called high blood pressure, is when the force of the blood pressing against the walls of the arteries is too strong. Arteries are blood vessels that carry blood from your heart throughout your body. Hypertension forces the heart to work harder to pump blood and may cause the arteries to become narrow or stiff. Understanding blood pressure readings Your personal target blood pressure may vary depending on your medical conditions, your age, and other factors. A blood pressure reading includes a higher number over a lower number. Ideally, your blood pressure should be below 120/80. You should know that:  The first, or top, number is called the systolic pressure. It is a measure of the pressure in your arteries as your heart beats.  The second, or bottom number, is called the diastolic pressure. It is a measure of the pressure in your arteries as the heart relaxes. Blood pressure is classified into four stages. Based on your blood pressure reading, your health care provider may use the following stages to determine what type of treatment you need, if any. Systolic pressure and diastolic pressure are measured in a unit called mmHg. Normal  Systolic pressure: below 120.  Diastolic pressure: below 80. Elevated  Systolic pressure: 120-129.  Diastolic pressure: below 80. Hypertension stage 1  Systolic pressure: 130-139.  Diastolic pressure: 80-89. Hypertension stage 2  Systolic pressure: 140 or above.  Diastolic pressure: 90 or above. How can this condition affect me? Managing your hypertension is an important responsibility. Over time, hypertension can damage the arteries and decrease blood flow to important parts of the body, including the brain, heart, and kidneys. Having untreated or uncontrolled hypertension can lead to:  A heart attack.  A stroke.  A weakened blood vessel (aneurysm).  Heart failure.  Kidney damage.  Eye  damage.  Metabolic syndrome.  Memory and concentration problems.  Vascular dementia. What actions can I take to manage this condition? Hypertension can be managed by making lifestyle changes and possibly by taking medicines. Your health care provider will help you make a plan to bring your blood pressure within a normal range. Nutrition  Eat a diet that is high in fiber and potassium, and low in salt (sodium), added sugar, and fat. An example eating plan is called the Dietary Approaches to Stop Hypertension (DASH) diet. To eat this way: ? Eat plenty of fresh fruits and vegetables. Try to fill one-half of your plate at each meal with fruits and vegetables. ? Eat whole grains, such as whole-wheat pasta, brown rice, or whole-grain bread. Fill about one-fourth of your plate with whole grains. ? Eat low-fat dairy products. ? Avoid fatty cuts of meat, processed or cured meats, and poultry with skin. Fill about one-fourth of your plate with lean proteins such as fish, chicken without skin, beans, eggs, and tofu. ? Avoid pre-made and processed foods. These tend to be higher in sodium, added sugar, and fat.  Reduce your daily sodium intake. Most people with hypertension should eat less than 1,500 mg of sodium a day.   Lifestyle  Work with your health care provider to maintain a healthy body weight or to lose weight. Ask what an ideal weight is for you.  Get at least 30 minutes of exercise that causes your heart to beat faster (aerobic exercise) most days of the week. Activities may include walking, swimming, or biking.  Include exercise to strengthen your muscles (resistance exercise), such as weight lifting, as part of your weekly exercise routine. Try   to do these types of exercises for 30 minutes at least 3 days a week.  Do not use any products that contain nicotine or tobacco, such as cigarettes, e-cigarettes, and chewing tobacco. If you need help quitting, ask your health care  provider.  Control any long-term (chronic) conditions you have, such as high cholesterol or diabetes.  Identify your sources of stress and find ways to manage stress. This may include meditation, deep breathing, or making time for fun activities.   Alcohol use  Do not drink alcohol if: ? Your health care provider tells you not to drink. ? You are pregnant, may be pregnant, or are planning to become pregnant.  If you drink alcohol: ? Limit how much you use to:  0-1 drink a day for women.  0-2 drinks a day for men. ? Be aware of how much alcohol is in your drink. In the [place] one drink equals one 12 oz bottle of beer (355 mL), one 5 oz glass of wine (148 mL), or one 1 oz glass of hard liquor (44 mL). Medicines Your health care provider may prescribe medicine if lifestyle changes are not enough to get your blood pressure under control and if:  Your systolic blood pressure is 130 or higher.  Your diastolic blood pressure is 80 or higher. Take medicines only as told by your health care provider. Follow the directions carefully. Blood pressure medicines must be taken as told by your health care provider. The medicine does not work as well when you skip doses. Skipping doses also puts you at risk for problems. Monitoring Before you monitor your blood pressure:  Do not smoke, drink caffeinated beverages, or exercise within 30 minutes before taking a measurement.  Use the bathroom and empty your bladder (urinate).  Sit quietly for at least 5 minutes before taking measurements. Monitor your blood pressure at home as told by your health care provider. To do this:  Sit with your back straight and supported.  Place your feet flat on the floor. Do not cross your legs.  Support your arm on a flat surface, such as a table. Make sure your upper arm is at heart level.  Each time you measure, take two or three readings one minute apart and record the results. You may also need to have your  blood pressure checked regularly by your health care provider.   General information  Talk with your health care provider about your diet, exercise habits, and other lifestyle factors that may be contributing to hypertension.  Review all the medicines you take with your health care provider because there may be side effects or interactions.  Keep all visits as told by your health care provider. Your health care provider can help you create and adjust your plan for managing your high blood pressure. Where to find more information  National Heart, Lung, and Blood Institute: www.nhlbi.nih.gov  American Heart Association: www.heart.org Contact a health care provider if:  You think you are having a reaction to medicines you have taken.  You have repeated (recurrent) headaches.  You feel dizzy.  You have swelling in your ankles.  You have trouble with your vision. Get help right away if:  You develop a severe headache or confusion.  You have unusual weakness or numbness, or you feel faint.  You have severe pain in your chest or abdomen.  You vomit repeatedly.  You have trouble breathing. These symptoms may represent a serious problem that is an emergency. Do not wait   to see if the symptoms will go away. Get medical help right away. Call your local emergency services (911 in the [place] Do not drive yourself to the hospital. Summary  Hypertension is when the force of blood pumping through your arteries is too strong. If this condition is not controlled, it may put you at risk for serious complications.  Your personal target blood pressure may vary depending on your medical conditions, your age, and other factors. For most people, a normal blood pressure is less than 120/80.  Hypertension is managed by lifestyle changes, medicines, or both.  Lifestyle changes to help manage hypertension include losing weight, eating a healthy, low-sodium diet, exercising more, stopping smoking, and  limiting alcohol. This information is not intended to replace advice given to you by your health care provider. Make sure you discuss any questions you have with your health care provider. Document Revised: 02/01/2019 Document Reviewed: 11/27/2018 Elsevier Patient Education  2021 [place].    Chronic Kidney Disease, Adult Chronic kidney disease is when lasting damage happens to the kidneys slowly over a long time. The kidneys help to:  Make pee (urine).  Make hormones.  Keep the right amount of fluids and chemicals in the body. Most often, this disease does not go away. You must take steps to help keep the kidney damage from getting worse. If steps are not taken, the kidneys might stop working forever. What are the causes?  Diabetes.  High blood pressure.  Diseases that affect the heart and blood vessels.  Other kidney diseases.  Diseases of the body's disease-fighting system.  A problem with the flow of pee.  Infections of the organs that make pee, store it, and take it out of the body.  Swelling or irritation of your blood vessels. What increases the risk?  Getting older.  Having someone in your family who has kidney disease or kidney failure.  Having a disease caused by genes.  Taking medicines often that harm the kidneys.  Being near or having contact with harmful substances.  Being very overweight.  Using tobacco now or in the past. What are the signs or symptoms?  Feeling very tired.  Having a swollen face, legs, ankles, or feet.  Feeling like you may vomit or vomiting.  Not feeling hungry.  Being confused or not able to focus.  Twitches and cramps in the leg muscles or other muscles.  Dry, itchy skin.  A taste of metal in your mouth.  Making less pee, or making more pee.  Shortness of breath.  Trouble sleeping. You may also become anemic or get weak bones. Anemic means there is not enough red blood cells or hemoglobin in your  blood. You may get symptoms slowly. You may not notice them until the kidney damage gets very bad. How is this treated? Often, there is no cure for this disease. Treatment can help with symptoms and help keep the disease from getting worse. You may need to:  Avoid alcohol.  Avoid foods that are high in salt, potassium, phosphorous, and protein.  Take medicines for symptoms and to help control other conditions.  Have dialysis. This treatment gets harmful waste out of your body.  Treat other problems that cause your kidney disease or make it worse. Follow these instructions at home: Medicines  Take over-the-counter and prescription medicines only as told by your doctor.  Do not take any new medicines, vitamins, or supplements unless your doctor says it is okay. Lifestyle  Do not smoke or  use any products that contain nicotine or tobacco. If you need help quitting, ask your doctor.  If you drink alcohol: ? Limit how much you use to:  0-1 drink a day for women who are not pregnant.  0-2 drinks a day for men. ? Know how much alcohol is in your drink. In the [place] one drink equals one 12 oz bottle of beer (355 mL), one 5 oz glass of wine (148 mL), or one 1 oz glass of hard liquor (44 mL).  Stay at a healthy weight. If you need help losing weight, ask your doctor.   General instructions  Follow instructions from your doctor about what you cannot eat or drink.  Track your blood pressure at home. Tell your doctor about any changes.  If you have diabetes, track your blood sugar.  Exercise at least 30 minutes a day, 5 days a week.  Keep your shots (vaccinations) up to date.  Keep all follow-up visits.   Where to find more information  American Association of Kidney Patients: BombTimer.gl  National Kidney Foundation: www.kidney.[place]: https://mathis.com/  Life Options: www.lifeoptions.org  Kidney School: www.kidneyschool.org Contact a doctor if:  Your  symptoms get worse.  You get new symptoms. Get help right away if:  You get symptoms of end-stage kidney disease. These include: ? Headaches. ? Losing feeling in your hands or feet. ? Easy bruising. ? Having hiccups often. ? Chest pain. ? Shortness of breath. ? Lack of menstrual periods, in women.  You have a fever.  You make less pee than normal.  You have pain or you bleed when you pee or poop. These symptoms may be an emergency. Get help right away. Call your local emergency services (911 in the [place]  Do not wait to see if the symptoms will go away.  Do not drive yourself to the hospital. Summary  Chronic kidney disease is when lasting damage happens to the kidneys slowly over a long time.  Causes of this disease include diabetes and high blood pressure.  Often, there is no cure for this disease. Treatment can help symptoms and help keep the disease from getting worse.  Treatment may involve lifestyle changes, medicines, and dialysis. This information is not intended to replace advice given to you by your health care provider. Make sure you discuss any questions you have with your health care provider. Document Revised: 04/03/2019 Document Reviewed: 04/03/2019 Elsevier Patient Education  [place] [facility] Disease End-stage kidney disease occurs when the kidneys are so damaged that they cannot function and cannot get better. This condition may also be referred to as end-stage renal disease or ESRD. The kidneys are two organs that do many important jobs in the body, including:  Removing waste and extra fluid from the blood to make urine.  Making hormones that maintain the amount of fluid in tissues and blood vessels.  Maintaining the right amount of fluids and chemicals in the body. Without functioning kidneys, toxins build up in the blood, and life-threatening complications can occur. What are the causes? This condition usually occurs when a  long-term (chronic) kidney disease gets worse and results in permanent damage to the kidneys. It may also be caused by sudden damage to the kidneys (acute kidney injury). What increases the risk? The following factors may make you more likely to develop this condition:  Having a family history of chronic kidney disease (CKD).  Chronic medical conditions that affect the kidneys, such as: ?  Cardiovascular disease, including high blood pressure. ? Diabetes. ? Certain diseases that affect the body's disease-fighting system (immunesystem).  Smoking or a history of smoking.  Overuse of over-the-counter pain medicines.  Being around or being in contact with poisonous (toxic) substances. What are the signs or symptoms? Symptoms of this condition include:  Swelling of the face, legs, ankles, or feet.  Numbness, tingling, or loss of feeling in the hands or feet.  Tiredness (lethargy).  Nausea or vomiting.  Confusion, trouble concentrating, or loss of consciousness.  Chest pain.  Shortness of breath.  Passing little or no urine.  Muscle twitches and cramps, especially in the legs.  Skin changes, such as: ? Dry, itchy skin. ? Pale skin due to anemia, including the skin and tissue around the eye (conjunctiva). ? Abnormally dark or light skin.  Loss of appetite.  Headaches.  Decrease in muscle size (muscle wasting).  Easy bruising.  Stopping of menstrual periods, in women.  Jerky movements (seizures). How is this diagnosed? This condition may be diagnosed based on:  A physical exam, including blood pressure measurements.  Urine tests.  Blood tests.  Imaging tests.  A test in which a sample of tissue is removed from the kidneys to be examined under a microscope (kidney biopsy). How is this treated? This condition may be treated with:  Dialysis, which is a procedure that removes toxic wastes from the body. There are two types of dialysis: ? Dialysis that is done  through your abdomen. This is called peritoneal dialysis and may be done several times a day. ? Dialysis that is done by a machine. This is called hemodialysis and may be done several times a week.  Kidney transplant. This is surgery to receive a new kidney. In addition to having dialysis or a kidney transplant, you may need to take medicines:  To control high blood pressure (hypertension).  To control high cholesterol.  To treat diabetes.  To maintain healthy levels of minerals in the blood (electrolytes). You may also be given a specific meal plan to follow that includes requirements or limits for:  Salt (sodium).  Protein.  Phosphorus.  Potassium.  Calcium. Follow these instructions at home: Medicines  Take over-the-counter and prescription medicines only as told by your health care provider.  Do not take any new medicines, vitamins, or mineral supplements unless approved by your health care provider. Many medicines and supplements can worsen kidney damage.  Follow instructions from your health care provider about adjusting the doses of any medicines you take. Lifestyle  Do not use any products that contain nicotine or tobacco, such as cigarettes, e-cigarettes, and chewing tobacco. If you need help quitting, ask your health care provider.  Achieve and maintain a healthy weight. If you need help with this, ask your health care provider.  Start or continue an exercise plan. Exercise at least 30 minutes a day, 5 days a week.  Follow your prescribed meal plan. General instructions  Stay current with your shots (immunizations) as told by your health care provider.  Keep track of your blood pressure. Report changes in your blood pressure as told by your health care provider.  If you are being treated for diabetes, monitor and track your blood sugar (blood glucose) levels as told by your health care provider.  Keep all follow-up visits. This is important. Where to find  more information  American Association of Kidney Patients: BombTimer.gl  National Kidney Foundation: www.kidney.[place]: https://mathis.com/  [facility]:  www.lifeoptions.org  Kidney School: www.kidneyschool.org Contact a health care provider if:  Your symptoms get worse.  You develop new symptoms. Get help right away if:  You have weakness in an arm or leg on one side of your body.  You have difficulty speaking or you are slurring your speech.  You have a sudden change in your vision.  You have a sudden, severe headache.  You have a sudden weight increase.  You have difficulty breathing.  Your symptoms suddenly get worse.  You have chest pain. Summary  End-stage kidney disease occurs when the kidneys are so damaged that they cannot function and cannot get better.  Without functioning kidneys, toxins build up in the blood and life-threatening complications can occur.  Treatment may include dialysis or a kidney transplant along with medicines and lifestyle changes. This information is not intended to replace advice given to you by your health care provider. Make sure you discuss any questions you have with your health care provider. Document Revised: 04/22/2019 Document Reviewed: 03/29/2019 Elsevier Patient Education  2021 [facility]   [address] [place], [place] [address]   Phone 1: [phone]

## 2020-05-15 LAB — HEPATITIS C ANTIBODY: Hep C Virus Ab: <0.1 s/co ratio (ref 0.0–0.9)

## 2020-06-03 ENCOUNTER — Other Ambulatory Visit: Payer: Self-pay | Admitting: Internal Medicine

## 2020-06-03 ENCOUNTER — Other Ambulatory Visit: Payer: Self-pay

## 2020-06-11 ENCOUNTER — Other Ambulatory Visit: Payer: Self-pay

## 2020-06-11 ENCOUNTER — Other Ambulatory Visit: Payer: Self-pay | Admitting: Nurse Practitioner

## 2020-06-15 ENCOUNTER — Other Ambulatory Visit: Payer: Self-pay

## 2020-06-19 ENCOUNTER — Other Ambulatory Visit: Payer: Self-pay | Admitting: Internal Medicine

## 2020-06-19 ENCOUNTER — Other Ambulatory Visit: Payer: Self-pay

## 2020-06-25 ENCOUNTER — Ambulatory Visit: Payer: BC Managed Care – PPO | Admitting: Nurse Practitioner

## 2020-06-25 ENCOUNTER — Other Ambulatory Visit: Payer: Self-pay

## 2020-06-25 ENCOUNTER — Encounter: Payer: Self-pay | Admitting: Nurse Practitioner

## 2020-06-25 VITALS — BP 140/98 | HR 67 | Temp 98.1°F | Ht 69.0 in | Wt 225.1 lb

## 2020-06-25 DIAGNOSIS — Z125 Encounter for screening for malignant neoplasm of prostate: Secondary | ICD-10-CM | POA: Diagnosis not present

## 2020-06-25 DIAGNOSIS — I10 Essential (primary) hypertension: Secondary | ICD-10-CM | POA: Diagnosis not present

## 2020-06-25 DIAGNOSIS — N184 Chronic kidney disease, stage 4 (severe): Secondary | ICD-10-CM | POA: Diagnosis not present

## 2020-06-25 LAB — POCT URINALYSIS DIPSTICK
Bilirubin, UA: NEGATIVE
Blood, UA: NEGATIVE
Glucose, UA: NEGATIVE
Ketones, UA: NEGATIVE
Leukocytes, UA: NEGATIVE
Nitrite, UA: NEGATIVE
Protein, UA: POSITIVE — AB
Spec Grav, UA: 1.025 (ref 1.010–1.025)
Urobilinogen, UA: 0.2 E.U./dL
pH, UA: 6.5 (ref 5.0–8.0)

## 2020-06-25 NOTE — Patient Instructions (Addendum)
Managing Your Hypertension Hypertension, also called high blood pressure, is when the force of the blood pressing against the walls of the arteries is too strong. Arteries are blood vessels that carry blood from your heart throughout your body. Hypertension forces the heart to work harder to pump blood and may cause the arteries tobecome narrow or stiff. Understanding blood pressure readings Your personal target blood pressure may vary depending on your medical conditions, your age, and other factors. A blood pressure reading includes a higher number over a lower number. Ideally, your blood pressure should be below 120/80. You should know that: The first, or top, number is called the systolic pressure. It is a measure of the pressure in your arteries as your heart beats. The second, or bottom number, is called the diastolic pressure. It is a measure of the pressure in your arteries as the heart relaxes. Blood pressure is classified into four stages. Based on your blood pressure reading, your health care provider may use the following stages to determine what type of treatment you need, if any. Systolic pressure and diastolicpressure are measured in a unit called mmHg. Normal Systolic pressure: below 120. Diastolic pressure: below 80. Elevated Systolic pressure: 120-129. Diastolic pressure: below 80. Hypertension stage 1 Systolic pressure: 130-139. Diastolic pressure: 80-89. Hypertension stage 2 Systolic pressure: 140 or above. Diastolic pressure: 90 or above. How can this condition affect me? Managing your hypertension is an important responsibility. Over time, hypertension can damage the arteries and decrease blood flow to important parts of the body, including the brain, heart, and kidneys. Having untreated or uncontrolled hypertension can lead to: A heart attack. A stroke. A weakened blood vessel (aneurysm). Heart failure. Kidney damage. Eye damage. Metabolic syndrome. Memory and  concentration problems. Vascular dementia. What actions can I take to manage this condition? Hypertension can be managed by making lifestyle changes and possibly by taking medicines. Your health care provider will help you make a plan to bring yourblood pressure within a normal range. Nutrition  Eat a diet that is high in fiber and potassium, and low in salt (sodium), added sugar, and fat. An example eating plan is called the Dietary Approaches to Stop Hypertension (DASH) diet. To eat this way: Eat plenty of fresh fruits and vegetables. Try to fill one-half of your plate at each meal with fruits and vegetables. Eat whole grains, such as whole-wheat pasta, brown rice, or whole-grain bread. Fill about one-fourth of your plate with whole grains. Eat low-fat dairy products. Avoid fatty cuts of meat, processed or cured meats, and poultry with skin. Fill about one-fourth of your plate with lean proteins such as fish, chicken without skin, beans, eggs, and tofu. Avoid pre-made and processed foods. These tend to be higher in sodium, added sugar, and fat. Reduce your daily sodium intake. Most people with hypertension should eat less than 1,500 mg of sodium a day.  Lifestyle  Work with your health care provider to maintain a healthy body weight or to lose weight. Ask what an ideal weight is for you. Get at least 30 minutes of exercise that causes your heart to beat faster (aerobic exercise) most days of the week. Activities may include walking, swimming, or biking. Include exercise to strengthen your muscles (resistance exercise), such as weight lifting, as part of your weekly exercise routine. Try to do these types of exercises for 30 minutes at least 3 days a week. Do not use any products that contain nicotine or tobacco, such as cigarettes, e-cigarettes, and chewing   tobacco. If you need help quitting, ask your health care provider. Control any long-term (chronic) conditions you have, such as high  cholesterol or diabetes. Identify your sources of stress and find ways to manage stress. This may include meditation, deep breathing, or making time for fun activities.  Alcohol use Do not drink alcohol if: Your health care provider tells you not to drink. You are pregnant, may be pregnant, or are planning to become pregnant. If you drink alcohol: Limit how much you use to: 0-1 drink a day for women. 0-2 drinks a day for men. Be aware of how much alcohol is in your drink. In the [place] one drink equals one 12 oz bottle of beer (355 mL), one 5 oz glass of wine (148 mL), or one 1 oz glass of hard liquor (44 mL). Medicines Your health care provider may prescribe medicine if lifestyle changes are not enough to get your blood pressure under control and if: Your systolic blood pressure is 130 or higher. Your diastolic blood pressure is 80 or higher. Take medicines only as told by your health care provider. Follow the directions carefully. Blood pressure medicines must be taken as told by your health care provider. The medicine does not work as well when you skip doses. Skippingdoses also puts you at risk for problems. Monitoring Before you monitor your blood pressure: Do not smoke, drink caffeinated beverages, or exercise within 30 minutes before taking a measurement. Use the bathroom and empty your bladder (urinate). Sit quietly for at least 5 minutes before taking measurements. Monitor your blood pressure at home as told by your health care provider. To do this: Sit with your back straight and supported. Place your feet flat on the floor. Do not cross your legs. Support your arm on a flat surface, such as a table. Make sure your upper arm is at heart level. Each time you measure, take two or three readings one minute apart and record the results. You may also need to have your blood pressure checked regularly by your healthcare provider. General information Talk with your health care  provider about your diet, exercise habits, and other lifestyle factors that may be contributing to hypertension. Review all the medicines you take with your health care provider because there may be side effects or interactions. Keep all visits as told by your health care provider. Your health care provider can help you create and adjust your plan for managing your high blood pressure. Where to find more information National Heart, Lung, and Blood Institute: www.nhlbi.nih.gov American Heart Association: www.heart.org Contact a health care provider if: You think you are having a reaction to medicines you have taken. You have repeated (recurrent) headaches. You feel dizzy. You have swelling in your ankles. You have trouble with your vision. Get help right away if: You develop a severe headache or confusion. You have unusual weakness or numbness, or you feel faint. You have severe pain in your chest or abdomen. You vomit repeatedly. You have trouble breathing. These symptoms may represent a serious problem that is an emergency. Do not wait to see if the symptoms will go away. Get medical help right away. Call your local emergency services (911 in the [place] Do not drive yourself to the hospital. Summary Hypertension is when the force of blood pumping through your arteries is too strong. If this condition is not controlled, it may put you at risk for serious complications. Your personal target blood pressure may vary depending on your medical conditions,   your age, and other factors. For most people, a normal blood pressure is less than 120/80. Hypertension is managed by lifestyle changes, medicines, or both. Lifestyle changes to help manage hypertension include losing weight, eating a healthy, low-sodium diet, exercising more, stopping smoking, and limiting alcohol. This information is not intended to replace advice given to you by your health care provider. Make sure you discuss any questions  you have with your healthcare provider.  Chronic Kidney Disease, Adult Chronic kidney disease is when lasting damage happens to the kidneys slowly over a long time. The kidneys help to: Make pee (urine). Make hormones. Keep the right amount of fluids and chemicals in the body. Most often, this disease does not go away. You must take steps to help keep the kidney damage from getting worse. If steps are not taken, the kidneys mightstop working forever. What are the causes? Diabetes. High blood pressure. Diseases that affect the heart and blood vessels. Other kidney diseases. Diseases of the body's disease-fighting system. A problem with the flow of pee. Infections of the organs that make pee, store it, and take it out of the body. Swelling or irritation of your blood vessels. What increases the risk? Getting older. Having someone in your family who has kidney disease or kidney failure. Having a disease caused by genes. Taking medicines often that harm the kidneys. Being near or having contact with harmful substances. Being very overweight. Using tobacco now or in the past. What are the signs or symptoms? Feeling very tired. Having a swollen face, legs, ankles, or feet. Feeling like you may vomit or vomiting. Not feeling hungry. Being confused or not able to focus. Twitches and cramps in the leg muscles or other muscles. Dry, itchy skin. A taste of metal in your mouth. Making less pee, or making more pee. Shortness of breath. Trouble sleeping. You may also become anemic or get weak bones. Anemic means there is not enoughred blood cells or hemoglobin in your blood. You may get symptoms slowly. You may not notice them until the kidney damagegets very bad. How is this treated? Often, there is no cure for this disease. Treatment can help with symptoms and help keep the disease from getting worse. You may need to: Avoid alcohol. Avoid foods that are high in salt, potassium,  phosphorous, and protein. Take medicines for symptoms and to help control other conditions. Have dialysis. This treatment gets harmful waste out of your body. Treat other problems that cause your kidney disease or make it worse. Follow these instructions at home: Medicines Take over-the-counter and prescription medicines only as told by your doctor. Do not take any new medicines, vitamins, or supplements unless your doctor says it is okay. Lifestyle  Do not smoke or use any products that contain nicotine or tobacco. If you need help quitting, ask your doctor. If you drink alcohol: Limit how much you use to: 0-1 drink a day for women who are not pregnant. 0-2 drinks a day for men. Know how much alcohol is in your drink. In the [place] one drink equals one 12 oz bottle of beer (355 mL), one 5 oz glass of wine (148 mL), or one 1 oz glass of hard liquor (44 mL). Stay at a healthy weight. If you need help losing weight, ask your doctor.  General instructions  Follow instructions from your doctor about what you cannot eat or drink. Track your blood pressure at home. Tell your doctor about any changes. If you have diabetes, track your  blood sugar. Exercise at least 30 minutes a day, 5 days a week. Keep your shots (vaccinations) up to date. Keep all follow-up visits.  Where to find more information American Association of Kidney Patients: BombTimer.gl National Kidney Foundation: www.kidney.[place]: https://mathis.com/ Life Options: www.lifeoptions.org Kidney School: www.kidneyschool.org Contact a doctor if: Your symptoms get worse. You get new symptoms. Get help right away if: You get symptoms of end-stage kidney disease. These include: Headaches. Losing feeling in your hands or feet. Easy bruising. Having hiccups often. Chest pain. Shortness of breath. Lack of menstrual periods, in women. You have a fever. You make less pee than normal. You have pain or you bleed  when you pee or poop. These symptoms may be an emergency. Get help right away. Call your local emergency services (911 in the [place] Do not wait to see if the symptoms will go away. Do not drive yourself to the hospital. Summary Chronic kidney disease is when lasting damage happens to the kidneys slowly over a long time. Causes of this disease include diabetes and high blood pressure. Often, there is no cure for this disease. Treatment can help symptoms and help keep the disease from getting worse. Treatment may involve lifestyle changes, medicines, and dialysis. This information is not intended to replace advice given to you by your health care provider. Make sure you discuss any questions you have with your healthcare provider. Document Revised: 04/03/2019 Document Reviewed: 04/03/2019 Elsevier Patient Education  2022 [place] Revised: 02/01/2019 Document Reviewed: 11/27/2018 Elsevier Patient Education  2022 [employer].

## 2020-06-25 NOTE — Progress Notes (Signed)
[address] [place], [place]  [address] Phone:  [phone]   Fax:  [phone]   Established Patient Office Visit  Subjective:  Patient ID: David [name], male    DOB: 07-03-1970  Age: 50 y.o. MRN: [id number]  CC:  Chief Complaint  Patient presents with   Follow-up    6 week followup    HPI David [name] presents for follow up. He  has a past medical history of AAA (abdominal aortic aneurysm) ([place]) (11/2019), Hypertension, Proteinuria (12/2019), and Vitamin D deficiency.   He has a history of hypertension with stage IV kidney disease.  He was being followed by nephrology however has not follow-up in several months.  He was to make a follow-up appointment however this has not been completed.  This is in part due to the nephrologist being [facility].  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  He reports the inability to tolerate some medications due to causing fatigue.  He is currently compliant with his amlodipine 10 mg 25 mg twice daily hydralazine 100 mg 3 times daily.  He has also been on furosemide 80 mg daily.  He denies any symptoms of headaches, dizziness, shortness of breath, chest pain or peripheral edema.  Due to his worsening renal function the furosemide was not filled at the last request because patient has not followed up with nephrology.  Nephrology was contacted during the visit and confirmed that patient would need to follow-up before any treatment changes could be made.  Nephrologist also confirmed [facility] [name] location and patient may be better served with a local referral.  Past Medical History:  Diagnosis Date   AAA (abdominal aortic aneurysm) ([place]) 11/2019   Hypertension    Proteinuria 12/2019   Vitamin D deficiency     Past Surgical History:  Procedure Laterality Date   none      Family History  Family history unknown: Yes    Social History   Socioeconomic History   Marital status: Married    Spouse name: Not on  file   Number of children: 5   Years of education: Not on file   Highest education level: Not on file  Occupational History   Occupation: truck driver  Tobacco Use   Smoking status: Former    Pack years: 0.00   Smokeless tobacco: Never  Vaping Use   Vaping Use: Never used  Substance and Sexual Activity   Alcohol use: No   Drug use: No   Sexual activity: Not on file  Other Topics Concern   Not on file  Social History Narrative   Not on file   Social Determinants of Health   Financial Resource Strain: Not on file  Food Insecurity: Not on file  Transportation Needs: Not on file  Physical Activity: Not on file  Stress: Not on file  Social Connections: Not on file  Intimate Partner Violence: Not on file    Outpatient Medications Prior to Visit  Medication Sig Dispense Refill   amLODipine (NORVASC) 10 MG tablet TAKE 1 TABLET (10 MG TOTAL) BY MOUTH DAILY. 90 tablet 3   carvedilol (COREG) 25 MG tablet TAKE 1 TABLET (25 MG TOTAL) BY MOUTH 2 (TWO) TIMES DAILY. 180 tablet 3   hydrALAZINE (APRESOLINE) 100 MG tablet TAKE 1 TABLET (100 MG TOTAL) BY MOUTH EVERY 8 (EIGHT) HOURS. 90 tablet 3   furosemide (LASIX) 80 MG tablet TAKE 1 TABLET (80 MG TOTAL) BY MOUTH DAILY. (Patient not taking:  Reported on 06/25/2020) 30 tablet 1   No facility-administered medications prior to visit.    No Known Allergies  ROS Review of Systems    Objective:    Physical Exam Constitutional:      General: He is not in acute distress.    Appearance: He is not ill-appearing, toxic-appearing or diaphoretic.  HENT:     Head: Normocephalic and atraumatic.  Cardiovascular:     Rate and Rhythm: Normal rate and regular rhythm.     Pulses: Normal pulses.     Heart sounds: Normal heart sounds.  Pulmonary:     Effort: Pulmonary effort is normal.     Breath sounds: Normal breath sounds.  Abdominal:     Palpations: Abdomen is soft.  Musculoskeletal:        General: Normal range of motion.     Right lower  leg: No edema.     Left lower leg: No edema.  Skin:    General: Skin is warm and dry.     Capillary Refill: Capillary refill takes less than 2 seconds.  Neurological:     General: No focal deficit present.     Mental Status: He is alert and oriented to person, place, and time.  Psychiatric:        Mood and Affect: Mood normal.        Behavior: Behavior normal.        Thought Content: Thought content normal.        Judgment: Judgment normal.    BP (!) 140/98 (BP Location: Right Arm, Patient Position: Sitting)   Pulse 67   Temp 98.1 F (36.7 C)   Ht '5\' 9"'$  (1.753 m)   Wt 225 lb 0.8 oz (102.1 kg)   SpO2 97%   BMI 33.23 kg/m  Wt Readings from Last 3 Encounters:  06/25/20 225 lb 0.8 oz (102.1 kg)  05/14/20 221 lb (100.2 kg)  04/29/20 217 lb 3.2 oz (98.5 kg)     Health Maintenance Due  Topic Date Due   COVID-19 Vaccine (1) Never done    There are no preventive care reminders to display for this patient.  Lab Results  Component Value Date   TSH 1.210 12/24/2019   Lab Results  Component Value Date   WBC 3.3 (L) 12/24/2019   HGB 11.8 (L) 12/24/2019   HCT 37.7 12/24/2019   MCV 83 12/24/2019   PLT 398 12/24/2019   Lab Results  Component Value Date   NA 140 03/10/2020   K 5.6 (H) 03/10/2020   CO2 22 03/10/2020   GLUCOSE 84 03/10/2020   BUN 46 (H) 03/10/2020   CREATININE 4.04 (H) 03/10/2020   BILITOT <0.2 12/24/2019   ALKPHOS 85 12/24/2019   AST 9 12/24/2019   ALT 8 12/24/2019   PROT 6.8 12/24/2019   ALBUMIN 3.9 (L) 12/24/2019   CALCIUM 9.3 03/10/2020   ANIONGAP 16 (H) 12/04/2019   Lab Results  Component Value Date   CHOL 189 12/24/2019   Lab Results  Component Value Date   HDL 38 (L) 12/24/2019   Lab Results  Component Value Date   LDLCALC 135 (H) 12/24/2019   Lab Results  Component Value Date   TRIG 86 12/24/2019   Lab Results  Component Value Date   CHOLHDL 5.0 12/24/2019   Lab Results  Component Value Date   HGBA1C 5.2 03/07/2018       Assessment & Plan:   Problem List Items Addressed This Visit  Cardiovascular and Mediastinum   Essential hypertension - Primary Persistent new referral to be placed to nephrology to nephrology for evaluation of continued use of diuretics with current kidney function Encouraged on going compliance with current medication regimen Encouraged home monitoring and recording BP <130/80 Eating a heart-healthy diet with less salt Encouraged regular physical activity  Recommend Weight loss     Relevant Orders   Comp. Metabolic Panel (12) (Completed)   Other Visit Diagnoses     CKD (chronic kidney disease) stage 4, GFR 15-29 ml/min (HCC)   Persistent nephrology referral pending    Relevant Orders   Comp. Metabolic Panel (12) (Completed)   POCT urinalysis dipstick (Completed)   Screening for malignant neoplasm of prostate       Relevant Orders   PSA (Completed)       No orders of the defined types were placed in this encounter.   Follow-up: Return in about 3 months (around 09/25/2020).    Vevelyn Francois, NP

## 2020-06-26 LAB — COMP. METABOLIC PANEL (12)
AST: 10 IU/L (ref 0–40)
Albumin/Globulin Ratio: 1.7 (ref 1.2–2.2)
Albumin: 4.3 g/dL (ref 4.0–5.0)
Alkaline Phosphatase: 91 IU/L (ref 44–121)
BUN/Creatinine Ratio: 9 (ref 9–20)
BUN: 30 mg/dL — ABNORMAL HIGH (ref 6–24)
Bilirubin Total: <0.2 mg/dL (ref 0.0–1.2)
Calcium: 9.2 mg/dL (ref 8.7–10.2)
Chloride: 103 mmol/L (ref 96–106)
Creatinine, Ser: 3.4 mg/dL — ABNORMAL HIGH (ref 0.76–1.27)
Globulin, Total: 2.6 g/dL (ref 1.5–4.5)
Glucose: 89 mg/dL (ref 65–99)
Potassium: 4.7 mmol/L (ref 3.5–5.2)
Sodium: 139 mmol/L (ref 134–144)
Total Protein: 6.9 g/dL (ref 6.0–8.5)
eGFR: 21 mL/min/{1.73_m2} — ABNORMAL LOW (ref >59)

## 2020-06-26 LAB — PSA: Prostate Specific Ag, Serum: 0.4 ng/mL (ref 0.0–4.0)

## 2020-06-29 ENCOUNTER — Telehealth: Payer: Self-pay | Admitting: Nurse Practitioner

## 2020-06-29 NOTE — Telephone Encounter (Signed)
Patient notified of results, verbally understood no additional questions or concerns.

## 2020-06-29 NOTE — Telephone Encounter (Signed)
-----   Message from Vevelyn Francois, NP sent at 06/29/2020 12:09 PM EDT ----- Please make [name] aware that he is renal function has improved not using the furosemide.  He will remain off this until he is seen by the kidney specialist. Thanks All other labs within normal range

## 2020-06-30 ENCOUNTER — Other Ambulatory Visit: Payer: Self-pay

## 2020-06-30 ENCOUNTER — Other Ambulatory Visit: Payer: Self-pay | Admitting: Nurse Practitioner

## 2020-07-08 ENCOUNTER — Other Ambulatory Visit: Payer: Self-pay

## 2020-07-09 ENCOUNTER — Other Ambulatory Visit: Payer: Self-pay

## 2020-07-09 MED FILL — Carvedilol Tab 25 MG: ORAL | 90 days supply | Qty: 180 | Fill #0 | Status: AC

## 2020-07-28 ENCOUNTER — Encounter: Payer: Self-pay | Admitting: Internal Medicine

## 2020-07-28 ENCOUNTER — Ambulatory Visit: Payer: BC Managed Care – PPO | Admitting: Internal Medicine

## 2020-07-28 ENCOUNTER — Other Ambulatory Visit: Payer: Self-pay

## 2020-07-28 VITALS — BP 178/118 | HR 70 | Temp 98.0°F | Resp 17 | Ht 66.25 in | Wt 234.5 lb

## 2020-07-28 DIAGNOSIS — E559 Vitamin D deficiency, unspecified: Secondary | ICD-10-CM

## 2020-07-28 DIAGNOSIS — N184 Chronic kidney disease, stage 4 (severe): Secondary | ICD-10-CM | POA: Diagnosis not present

## 2020-07-28 DIAGNOSIS — I10 Essential (primary) hypertension: Secondary | ICD-10-CM | POA: Diagnosis not present

## 2020-07-28 DIAGNOSIS — I714 Abdominal aortic aneurysm, without rupture, unspecified: Secondary | ICD-10-CM

## 2020-07-28 DIAGNOSIS — Z9114 Patient's other noncompliance with medication regimen: Secondary | ICD-10-CM

## 2020-07-28 MED ORDER — CLONIDINE HCL 0.1 MG PO TABS: 0.1000 mg | ORAL_TABLET | Freq: Once | ORAL | Status: DC

## 2020-07-28 MED ORDER — FUROSEMIDE 40 MG PO TABS
40.0000 mg | ORAL_TABLET | Freq: Every day | ORAL | 1 refills | Status: DC
  Filled 2020-07-28: qty 30, 30d supply, fill #0

## 2020-07-28 MED ORDER — IRON (FERROUS SULFATE) 325 (65 FE) MG PO TABS
325.0000 mg | ORAL_TABLET | Freq: Two times a day (BID) | ORAL | 1 refills | Status: DC
Start: 2020-07-28 — End: 2021-01-18
  Filled 2020-07-28: qty 60, fill #0

## 2020-07-28 NOTE — Assessment & Plan Note (Addendum)
-   Blood pressure remains uncontrolled. -Patient has not come with a list of his antihypertensive medications. -Patient recalls taking amlodipine, , Hydralazine and Coreg. -Goal blood pressure should be less than 130/80 mmHg, all, even lower, considering history of abdominal aortic aneurysm. -Diuretics have been on hold.  Will re-start Lasix, but at a lower dose, 40 mg po once daily. -It remains unclear if patient takes clonidine. -Patient has been encouraged to take his medications to the providers/office visits. -Check blood pressure at home 4 times daily, document readings and review with the provider. -Need for optimized blood pressure control discussed with the patient extensively.  -Patient was administered total of 0.2 mg of clonidine and, was monitored in the clinic for over 2 hours.  Systolic blood pressure dropped today 150s millimeters mercury prior to discharge.

## 2020-07-28 NOTE — Assessment & Plan Note (Signed)
-   Need to comply with medications discussed with the patient extensively.

## 2020-07-28 NOTE — Assessment & Plan Note (Signed)
-   Patient's blood pressure will need to be aggressively controlled. -Yearly Doppler ultrasound to monitor abdominal aortic aneurysm (will defer to the primary care provider).-Currently, blood pressures not optimized.   -Patient has not come to the clinic with the list of his medications.

## 2020-07-28 NOTE — Assessment & Plan Note (Addendum)
-   Chronic kidney disease is likely secondary to uncontrolled hypertension. -Check BMP with GFR, phosphorus, 25-hydroxy vitamin D and intact PTH. -Patient has been advised to follow-up with a local nephrologist in [place] (discussed with Dr. Elmarie Shiley, nephrologist in [facility].  Will refer patient to [facility], [place], [place])

## 2020-07-28 NOTE — Progress Notes (Unsigned)
Established Patient Office Visit  Subjective:  Patient ID: David [name], male    DOB: 1970/12/07  Age: 50 y.o. MRN: [id number]  CC:  No chief complaint on file.   HPI Patient is a 50 year old African-American male with past medical history significant for chronic kidney disease stage IV that is likely secondary to uncontrolled hypertension, noncompliance, vitamin D deficiency, abdominal aortic aneurysm and proteinuria.  Patient has come for follow-up of chronic kidney disease stage IV and uncontrolled hypertension.  Blood pressure remains uncontrolled today at the clinic.  I doubt if the patient monitors his blood pressure at home.  BMP done on 06/25/2020 at the primary care provider's office revealed sodium of 139, potassium of 4.7, chloride 103, BUN of 30, creatinine of 3.4 blood sugar of 89, weight EGFR of 21 mils per minute per 1.73 m.  Lasix 80 Mg p.o. once daily has been on hold.  No leg swelling, chest pain, shortness of breath, paroxysmal nocturnal dyspnea, orthopnea, headache, neck pain, GI symptoms or joint pains.  No prior history of vasculitis, skin rash, polycystic kidney disease or glomerulonephritis.  Patient denies use of NSAIDs, over-the-counter medications or Chinese herbs.  Patient did not come with his home medications to the clinic today.    Past Medical History:  Diagnosis Date   AAA (abdominal aortic aneurysm) ([place]) 11/2019   Hypertension    Proteinuria 12/2019   Vitamin D deficiency     Past Surgical History:  Procedure Laterality Date   none      Family History  Family history unknown: Yes    Social History   Socioeconomic History   Marital status: Married    Spouse name: Not on file   Number of children: 5   Years of education: Not on file   Highest education level: Not on file  Occupational History   Occupation: truck driver  Tobacco Use   Smoking status: Former   Smokeless tobacco: Never  Scientific laboratory technician Use: Never used  Substance and  Sexual Activity   Alcohol use: No   Drug use: No   Sexual activity: Not on file  Other Topics Concern   Not on file  Social History Narrative   Not on file   Social Determinants of Health   Financial Resource Strain: Not on file  Food Insecurity: Not on file  Transportation Needs: Not on file  Physical Activity: Not on file  Stress: Not on file  Social Connections: Not on file  Intimate Partner Violence: Not on file    Outpatient Medications Prior to Visit  Medication Sig Dispense Refill   amLODipine (NORVASC) 10 MG tablet TAKE 1 TABLET (10 MG TOTAL) BY MOUTH DAILY. 90 tablet 3   carvedilol (COREG) 25 MG tablet TAKE 1 TABLET (25 MG TOTAL) BY MOUTH 2 (TWO) TIMES DAILY. 180 tablet 3   hydrALAZINE (APRESOLINE) 100 MG tablet TAKE 1 TABLET (100 MG TOTAL) BY MOUTH EVERY 8 (EIGHT) HOURS. 90 tablet 3   furosemide (LASIX) 80 MG tablet TAKE 1 TABLET (80 MG TOTAL) BY MOUTH DAILY. (Patient not taking: Reported on 06/25/2020) 30 tablet 1   No facility-administered medications prior to visit.    No Known Allergies  ROS Review of Systems  Constitutional: Negative.   HENT: Negative.    Eyes: Negative.   Respiratory: Negative.    Cardiovascular: Negative.   Gastrointestinal: Negative.   Endocrine: Negative.   Genitourinary: Negative.   Musculoskeletal: Negative.   Allergic/Immunologic: Negative.   Neurological: Negative.  Hematological: Negative.   Psychiatric/Behavioral: Negative.       Objective:    Physical Exam Constitutional:      Appearance: Normal appearance.  HENT:     Head: Normocephalic.     Nose: Nose normal.     Mouth/Throat:     Mouth: Mucous membranes are moist.     Pharynx: Oropharynx is clear.  Eyes:     Extraocular Movements: Extraocular movements intact.     Pupils: Pupils are equal, round, and reactive to light.  Cardiovascular:     Rate and Rhythm: Normal rate and regular rhythm.     Pulses: Normal pulses.  Pulmonary:     Effort: Pulmonary effort  is normal.     Breath sounds: Normal breath sounds.  Abdominal:     General: Abdomen is flat. Bowel sounds are normal.     Palpations: Abdomen is soft.  Musculoskeletal:     Cervical back: Normal range of motion and neck supple.     Right lower leg: Edema present.     Left lower leg: Edema present.     Comments: Mild bilateral ankle edema  Neurological:     General: No focal deficit present.     Mental Status: He is alert and oriented to person, place, and time. Mental status is at baseline.  Psychiatric:        Mood and Affect: Mood normal.        Behavior: Behavior normal.        Thought Content: Thought content normal.        Judgment: Judgment normal.    BP (!) 178/118 (BP Location: Left Arm, Patient Position: Sitting, Cuff Size: Normal)   Pulse 70   Temp 98 F (36.7 C) (Temporal)   Resp 17   Ht 5' 6.25" (1.683 m)   Wt 234 lb 8 oz (106.4 kg)   SpO2 97%   BMI 37.56 kg/m  Wt Readings from Last 3 Encounters:  07/28/20 234 lb 8 oz (106.4 kg)  06/25/20 225 lb 0.8 oz (102.1 kg)  05/14/20 221 lb (100.2 kg)     Health Maintenance Due  Topic Date Due   COVID-19 Vaccine (1) Never done    There are no preventive care reminders to display for this patient.  Lab Results  Component Value Date   TSH 1.210 12/24/2019   Lab Results  Component Value Date   WBC 3.3 (L) 12/24/2019   HGB 11.8 (L) 12/24/2019   HCT 37.7 12/24/2019   MCV 83 12/24/2019   PLT 398 12/24/2019   Lab Results  Component Value Date   NA 139 06/25/2020   K 4.7 06/25/2020   CO2 22 03/10/2020   GLUCOSE 89 06/25/2020   BUN 30 (H) 06/25/2020   CREATININE 3.40 (H) 06/25/2020   BILITOT <0.2 06/25/2020   ALKPHOS 91 06/25/2020   AST 10 06/25/2020   ALT 8 12/24/2019   PROT 6.9 06/25/2020   ALBUMIN 4.3 06/25/2020   CALCIUM 9.2 06/25/2020   ANIONGAP 16 (H) 12/04/2019   EGFR 21 (L) 06/25/2020   Lab Results  Component Value Date   CHOL 189 12/24/2019   Lab Results  Component Value Date   HDL 38  (L) 12/24/2019   Lab Results  Component Value Date   LDLCALC 135 (H) 12/24/2019   Lab Results  Component Value Date   TRIG 86 12/24/2019   Lab Results  Component Value Date   CHOLHDL 5.0 12/24/2019   Lab Results  Component Value Date  HGBA1C 5.2 03/07/2018      Assessment & Plan:   Problem List Items Addressed This Visit       Cardiovascular and Mediastinum   AAA (abdominal aortic aneurysm) without rupture ([place])    - Patient's blood pressure will need to be aggressively controlled. -Yearly Doppler ultrasound to monitor abdominal aortic aneurysm (will defer to the primary care provider).-Currently, blood pressures not optimized.   -Patient has not come to the clinic with the list of his medications.       Relevant Medications   cloNIDine (CATAPRES) tablet 0.1 mg   furosemide (LASIX) 40 MG tablet   cloNIDine (CATAPRES) tablet 0.1 mg   Uncontrolled hypertension    - Blood pressure remains uncontrolled. -Patient has not come with a list of his antihypertensive medications. -Patient recalls taking amlodipine, , Hydralazine and Coreg. -Goal blood pressure should be less than 130/80 mmHg, all, even lower, considering history of abdominal aortic aneurysm. -Diuretics have been on hold.  Will re-start Lasix, but at a lower dose, 40 mg po once daily. -It remains unclear if patient takes clonidine. -Patient has been encouraged to take his medications to the providers/office visits. -Check blood pressure at home 4 times daily, document readings and review with the provider. -Need for optimized blood pressure control discussed with the patient extensively.       Relevant Medications   cloNIDine (CATAPRES) tablet 0.1 mg   furosemide (LASIX) 40 MG tablet   cloNIDine (CATAPRES) tablet 0.1 mg   Other Relevant Orders   BASIC METABOLIC PANEL WITH GFR   Vitamin D (25 hydroxy)   Phosphorus   Parathyroid hormone, intact (no Ca)     Genitourinary   Chronic kidney disease, stage  IV (severe) (HCC)    - Chronic kidney disease is likely secondary to uncontrolled hypertension. -Check BMP with GFR, phosphorus, 25-hydroxy vitamin D and intact PTH. -Patient has been advised to follow-up with a local nephrologist in [place] (discussed with Dr. Elmarie Shiley, nephrologist in [facility].  Will refer patient to [facility], [place], [place])         Other   History of medication noncompliance    - Need to comply with medications discussed with the patient extensively.       Other Visit Diagnoses     CKD (chronic kidney disease) stage 4, GFR 15-29 ml/min (HCC)    -  Primary   Relevant Medications   cloNIDine (CATAPRES) tablet 0.1 mg   Other Relevant Orders   BASIC METABOLIC PANEL WITH GFR   Vitamin D (25 hydroxy)   Phosphorus   Parathyroid hormone, intact (no Ca)   Vitamin D deficiency       Relevant Medications   cloNIDine (CATAPRES) tablet 0.1 mg   Other Relevant Orders   BASIC METABOLIC PANEL WITH GFR   Vitamin D (25 hydroxy)   Phosphorus   Parathyroid hormone, intact (no Ca)       Meds ordered this encounter  Medications   Iron, Ferrous Sulfate, 325 (65 Fe) MG TABS    Sig: Take 325 mg by mouth in the morning and at bedtime.    Dispense:  60 tablet    Refill:  1   cloNIDine (CATAPRES) tablet 0.1 mg   furosemide (LASIX) 40 MG tablet    Sig: Take 1 tablet (40 mg total) by mouth daily.    Dispense:  30 tablet    Refill:  1   cloNIDine (CATAPRES) tablet 0.1 mg  Follow-up: Return in about 2 months (around 09/28/2020).    Bonnell Public, MD

## 2020-07-29 LAB — BASIC METABOLIC PANEL WITH GFR
BUN/Creatinine Ratio: 9 (calc) (ref 6–22)
BUN: 36 mg/dL — ABNORMAL HIGH (ref 7–25)
CO2: 25 mmol/L (ref 20–32)
Calcium: 9.4 mg/dL (ref 8.6–10.3)
Chloride: 101 mmol/L (ref 98–110)
Creat: 3.83 mg/dL — ABNORMAL HIGH (ref 0.70–1.30)
Glucose, Bld: 89 mg/dL (ref 65–99)
Potassium: 5 mmol/L (ref 3.5–5.3)
Sodium: 135 mmol/L (ref 135–146)
eGFR: 18 mL/min/{1.73_m2} — ABNORMAL LOW (ref >=60)

## 2020-07-29 LAB — VITAMIN D 25 HYDROXY (VIT D DEFICIENCY, FRACTURES): Vit D, 25-Hydroxy: 25 ng/mL — ABNORMAL LOW (ref 30–100)

## 2020-07-29 LAB — PHOSPHORUS: Phosphorus: 4.8 mg/dL — ABNORMAL HIGH (ref 2.5–4.5)

## 2020-07-29 LAB — PARATHYROID HORMONE, INTACT (NO CA): PTH: 196 pg/mL — ABNORMAL HIGH (ref 16–77)

## 2020-08-04 ENCOUNTER — Other Ambulatory Visit: Payer: Self-pay

## 2020-08-11 ENCOUNTER — Other Ambulatory Visit: Payer: Self-pay

## 2020-08-11 MED FILL — Amlodipine Besylate Tab 10 MG (Base Equivalent): ORAL | 90 days supply | Qty: 90 | Fill #1 | Status: AC

## 2020-08-25 ENCOUNTER — Ambulatory Visit (HOSPITAL_BASED_OUTPATIENT_CLINIC_OR_DEPARTMENT_OTHER): Payer: BC Managed Care – PPO | Admitting: Cardiovascular Disease

## 2020-09-11 ENCOUNTER — Other Ambulatory Visit: Payer: Self-pay

## 2020-09-11 ENCOUNTER — Other Ambulatory Visit: Payer: Self-pay | Admitting: Nurse Practitioner

## 2020-09-11 DIAGNOSIS — I10 Essential (primary) hypertension: Secondary | ICD-10-CM

## 2020-09-11 MED ORDER — HYDRALAZINE HCL 100 MG PO TABS
ORAL_TABLET | ORAL | 3 refills | Status: DC
Start: 2020-09-11 — End: 2021-01-18
  Filled 2020-09-11: qty 90, 30d supply, fill #0

## 2020-09-18 ENCOUNTER — Other Ambulatory Visit: Payer: Self-pay

## 2020-09-28 ENCOUNTER — Ambulatory Visit: Payer: Self-pay | Admitting: Nurse Practitioner

## 2020-09-28 ENCOUNTER — Ambulatory Visit: Payer: BC Managed Care – PPO | Admitting: Internal Medicine

## 2020-09-29 ENCOUNTER — Telehealth: Payer: Self-pay | Admitting: Internal Medicine

## 2020-09-30 ENCOUNTER — Ambulatory Visit: Payer: BC Managed Care – PPO | Admitting: Internal Medicine

## 2020-10-07 ENCOUNTER — Ambulatory Visit (HOSPITAL_BASED_OUTPATIENT_CLINIC_OR_DEPARTMENT_OTHER): Payer: BC Managed Care – PPO | Admitting: Cardiovascular Disease

## 2020-11-27 NOTE — Telephone Encounter (Signed)
CompletedCommunicated - Appointment Reminder Call-Spoke to pt-pt confirmed

## 2020-11-30 ENCOUNTER — Other Ambulatory Visit: Payer: Self-pay

## 2020-12-07 ENCOUNTER — Ambulatory Visit (HOSPITAL_BASED_OUTPATIENT_CLINIC_OR_DEPARTMENT_OTHER): Payer: BC Managed Care – PPO | Admitting: Cardiovascular Disease

## 2021-01-14 ENCOUNTER — Emergency Department (HOSPITAL_COMMUNITY): Payer: BC Managed Care – PPO

## 2021-01-14 ENCOUNTER — Other Ambulatory Visit: Payer: Self-pay

## 2021-01-14 ENCOUNTER — Inpatient Hospital Stay (HOSPITAL_COMMUNITY)
Admission: EM | Admit: 2021-01-14 | Discharge: 2021-01-18 | DRG: 304 | Disposition: A | Discharge status: Home / Self Care | Payer: BC Managed Care – PPO | Attending: Pulmonary Disease | Admitting: Pulmonary Disease

## 2021-01-14 DIAGNOSIS — I5032 Chronic diastolic (congestive) heart failure: Secondary | ICD-10-CM | POA: Diagnosis not present

## 2021-01-14 DIAGNOSIS — N184 Chronic kidney disease, stage 4 (severe): Secondary | ICD-10-CM | POA: Diagnosis present

## 2021-01-14 DIAGNOSIS — E669 Obesity, unspecified: Secondary | ICD-10-CM | POA: Diagnosis not present

## 2021-01-14 DIAGNOSIS — Z6833 Body mass index (BMI) 33.0-33.9, adult: Secondary | ICD-10-CM

## 2021-01-14 DIAGNOSIS — R29700 NIHSS score 0: Secondary | ICD-10-CM | POA: Diagnosis present

## 2021-01-14 DIAGNOSIS — I10 Essential (primary) hypertension: Secondary | ICD-10-CM | POA: Diagnosis not present

## 2021-01-14 DIAGNOSIS — R519 Headache, unspecified: Secondary | ICD-10-CM | POA: Diagnosis not present

## 2021-01-14 DIAGNOSIS — N19 Unspecified kidney failure: Secondary | ICD-10-CM

## 2021-01-14 DIAGNOSIS — F17210 Nicotine dependence, cigarettes, uncomplicated: Secondary | ICD-10-CM | POA: Diagnosis not present

## 2021-01-14 DIAGNOSIS — J9811 Atelectasis: Secondary | ICD-10-CM | POA: Diagnosis not present

## 2021-01-14 DIAGNOSIS — E559 Vitamin D deficiency, unspecified: Secondary | ICD-10-CM | POA: Diagnosis present

## 2021-01-14 DIAGNOSIS — Z7982 Long term (current) use of aspirin: Secondary | ICD-10-CM

## 2021-01-14 DIAGNOSIS — N179 Acute kidney failure, unspecified: Secondary | ICD-10-CM | POA: Diagnosis not present

## 2021-01-14 DIAGNOSIS — I513 Intracardiac thrombosis, not elsewhere classified: Secondary | ICD-10-CM | POA: Diagnosis not present

## 2021-01-14 DIAGNOSIS — J984 Other disorders of lung: Secondary | ICD-10-CM

## 2021-01-14 DIAGNOSIS — Z8616 Personal history of COVID-19: Secondary | ICD-10-CM

## 2021-01-14 DIAGNOSIS — I130 Hypertensive heart and chronic kidney disease with heart failure and stage 1 through stage 4 chronic kidney disease, or unspecified chronic kidney disease: Secondary | ICD-10-CM | POA: Diagnosis present

## 2021-01-14 DIAGNOSIS — I639 Cerebral infarction, unspecified: Secondary | ICD-10-CM | POA: Diagnosis not present

## 2021-01-14 DIAGNOSIS — I6340 Cerebral infarction due to embolism of unspecified cerebral artery: Secondary | ICD-10-CM | POA: Insufficient documentation

## 2021-01-14 DIAGNOSIS — I7143 Infrarenal abdominal aortic aneurysm, without rupture: Secondary | ICD-10-CM | POA: Diagnosis present

## 2021-01-14 DIAGNOSIS — F17200 Nicotine dependence, unspecified, uncomplicated: Secondary | ICD-10-CM | POA: Diagnosis not present

## 2021-01-14 DIAGNOSIS — Z79899 Other long term (current) drug therapy: Secondary | ICD-10-CM | POA: Diagnosis not present

## 2021-01-14 DIAGNOSIS — I161 Hypertensive emergency: Principal | ICD-10-CM | POA: Diagnosis present

## 2021-01-14 DIAGNOSIS — E785 Hyperlipidemia, unspecified: Secondary | ICD-10-CM | POA: Diagnosis present

## 2021-01-14 DIAGNOSIS — I5030 Unspecified diastolic (congestive) heart failure: Secondary | ICD-10-CM | POA: Diagnosis not present

## 2021-01-14 DIAGNOSIS — J9600 Acute respiratory failure, unspecified whether with hypoxia or hypercapnia: Secondary | ICD-10-CM

## 2021-01-14 DIAGNOSIS — I6783 Posterior reversible encephalopathy syndrome: Secondary | ICD-10-CM | POA: Diagnosis present

## 2021-01-14 DIAGNOSIS — I6602 Occlusion and stenosis of left middle cerebral artery: Secondary | ICD-10-CM | POA: Diagnosis not present

## 2021-01-14 DIAGNOSIS — I6330 Cerebral infarction due to thrombosis of unspecified cerebral artery: Secondary | ICD-10-CM | POA: Diagnosis not present

## 2021-01-14 DIAGNOSIS — E782 Mixed hyperlipidemia: Secondary | ICD-10-CM | POA: Diagnosis not present

## 2021-01-14 DIAGNOSIS — J439 Emphysema, unspecified: Secondary | ICD-10-CM | POA: Diagnosis not present

## 2021-01-14 DIAGNOSIS — I129 Hypertensive chronic kidney disease with stage 1 through stage 4 chronic kidney disease, or unspecified chronic kidney disease: Secondary | ICD-10-CM | POA: Diagnosis not present

## 2021-01-14 DIAGNOSIS — I619 Nontraumatic intracerebral hemorrhage, unspecified: Secondary | ICD-10-CM | POA: Insufficient documentation

## 2021-01-14 DIAGNOSIS — I700 Atherosclerosis of aorta: Secondary | ICD-10-CM | POA: Diagnosis not present

## 2021-01-14 DIAGNOSIS — N189 Chronic kidney disease, unspecified: Secondary | ICD-10-CM | POA: Diagnosis not present

## 2021-01-14 DIAGNOSIS — I609 Nontraumatic subarachnoid hemorrhage, unspecified: Secondary | ICD-10-CM

## 2021-01-14 DIAGNOSIS — I6389 Other cerebral infarction: Secondary | ICD-10-CM | POA: Diagnosis not present

## 2021-01-14 LAB — COMPREHENSIVE METABOLIC PANEL
ALT: 11 U/L (ref 0–44)
AST: 15 U/L (ref 15–41)
Albumin: 3.8 g/dL (ref 3.5–5.0)
Alkaline Phosphatase: 95 U/L (ref 38–126)
Anion gap: 9 (ref 5–15)
BUN: 35 mg/dL — ABNORMAL HIGH (ref 6–20)
CO2: 25 mmol/L (ref 22–32)
Calcium: 9 mg/dL (ref 8.9–10.3)
Chloride: 102 mmol/L (ref 98–111)
Creatinine, Ser: 6.25 mg/dL — ABNORMAL HIGH (ref 0.61–1.24)
GFR, Estimated: 10 mL/min — ABNORMAL LOW (ref >60)
Glucose, Bld: 92 mg/dL (ref 70–99)
Potassium: 3.7 mmol/L (ref 3.5–5.1)
Sodium: 136 mmol/L (ref 135–145)
Total Bilirubin: 0.8 mg/dL (ref 0.3–1.2)
Total Protein: 7.1 g/dL (ref 6.5–8.1)

## 2021-01-14 LAB — CBC WITH DIFFERENTIAL/PLATELET
Abs Immature Granulocytes: 0.01 10*3/uL (ref 0.00–0.07)
Basophils Absolute: 0 10*3/uL (ref 0.0–0.1)
Basophils Relative: 1 %
Eosinophils Absolute: 0.2 10*3/uL (ref 0.0–0.5)
Eosinophils Relative: 4 %
HCT: 41.8 % (ref 39.0–52.0)
Hemoglobin: 13.3 g/dL (ref 13.0–17.0)
Immature Granulocytes: 0 %
Lymphocytes Relative: 32 %
Lymphs Abs: 1.4 10*3/uL (ref 0.7–4.0)
MCH: 27 pg (ref 26.0–34.0)
MCHC: 31.8 g/dL (ref 30.0–36.0)
MCV: 84.8 fL (ref 80.0–100.0)
Monocytes Absolute: 0.6 10*3/uL (ref 0.1–1.0)
Monocytes Relative: 14 %
Neutro Abs: 2.2 10*3/uL (ref 1.7–7.7)
Neutrophils Relative %: 49 %
Platelets: 167 10*3/uL (ref 150–400)
RBC: 4.93 MIL/uL (ref 4.22–5.81)
RDW: 15.9 % — ABNORMAL HIGH (ref 11.5–15.5)
WBC: 4.5 10*3/uL (ref 4.0–10.5)
nRBC: 0 % (ref 0.0–0.2)

## 2021-01-14 LAB — RESP PANEL BY RT-PCR (FLU A&B, COVID) ARPGX2
Influenza A by PCR: NEGATIVE
Influenza B by PCR: NEGATIVE
SARS Coronavirus 2 by RT PCR: NEGATIVE

## 2021-01-14 NOTE — ED Provider Triage Note (Signed)
Emergency Medicine Provider Triage Evaluation Note  David [name] , a 51 y.o. male  was evaluated in triage.  Pt complains of hypertension, headache, and loss of taste.  Symptoms have been present over the last 3 to 4 days.    Review of Systems  Positive: Hypertension, headache, loss of taste Negative: Chest pain, shortness of breath, visual disturbance, fever, chills  Physical Exam  BP (!) 209/145 (BP Location: Right Arm)    Pulse 69    Temp 98.7 F (37.1 C) (Oral)    Resp 18    SpO2 99%  Gen:   Awake, no distress   Resp:  Normal effort  MSK:   Moves extremities without difficulty  Other:  No dysarthria.  Patient moves all limbs equally.  Medical Decision Making  Medically screening exam initiated at 10:12 PM.  Appropriate orders placed.  David [name] was informed that the remainder of the evaluation will be completed by another provider, this initial triage assessment does not replace that evaluation, and the importance of remaining in the ED until their evaluation is complete.     Loni Beckwith, [place] 01/14/21 2216

## 2021-01-14 NOTE — ED Triage Notes (Signed)
Pt reports hypertension x 2 days despite compliance with antihypertensive medication. Endorses associated posterior headache om the evenings. Also reports loss of taste x 2 days.

## 2021-01-15 ENCOUNTER — Inpatient Hospital Stay (HOSPITAL_COMMUNITY): Payer: BC Managed Care – PPO

## 2021-01-15 ENCOUNTER — Emergency Department (HOSPITAL_COMMUNITY): Payer: BC Managed Care – PPO

## 2021-01-15 ENCOUNTER — Other Ambulatory Visit (HOSPITAL_COMMUNITY): Payer: BC Managed Care – PPO

## 2021-01-15 ENCOUNTER — Encounter (HOSPITAL_COMMUNITY): Payer: Self-pay | Admitting: Emergency Medicine

## 2021-01-15 DIAGNOSIS — E785 Hyperlipidemia, unspecified: Secondary | ICD-10-CM | POA: Diagnosis present

## 2021-01-15 DIAGNOSIS — I6389 Other cerebral infarction: Secondary | ICD-10-CM | POA: Diagnosis not present

## 2021-01-15 DIAGNOSIS — E669 Obesity, unspecified: Secondary | ICD-10-CM | POA: Diagnosis present

## 2021-01-15 DIAGNOSIS — I5032 Chronic diastolic (congestive) heart failure: Secondary | ICD-10-CM | POA: Diagnosis present

## 2021-01-15 DIAGNOSIS — I639 Cerebral infarction, unspecified: Secondary | ICD-10-CM

## 2021-01-15 DIAGNOSIS — R519 Headache, unspecified: Secondary | ICD-10-CM | POA: Diagnosis present

## 2021-01-15 DIAGNOSIS — I6330 Cerebral infarction due to thrombosis of unspecified cerebral artery: Secondary | ICD-10-CM | POA: Diagnosis not present

## 2021-01-15 DIAGNOSIS — Z8616 Personal history of COVID-19: Secondary | ICD-10-CM | POA: Diagnosis not present

## 2021-01-15 DIAGNOSIS — I130 Hypertensive heart and chronic kidney disease with heart failure and stage 1 through stage 4 chronic kidney disease, or unspecified chronic kidney disease: Secondary | ICD-10-CM | POA: Diagnosis present

## 2021-01-15 DIAGNOSIS — E559 Vitamin D deficiency, unspecified: Secondary | ICD-10-CM | POA: Diagnosis present

## 2021-01-15 DIAGNOSIS — I161 Hypertensive emergency: Principal | ICD-10-CM

## 2021-01-15 DIAGNOSIS — F17210 Nicotine dependence, cigarettes, uncomplicated: Secondary | ICD-10-CM | POA: Diagnosis present

## 2021-01-15 DIAGNOSIS — I513 Intracardiac thrombosis, not elsewhere classified: Secondary | ICD-10-CM | POA: Diagnosis present

## 2021-01-15 DIAGNOSIS — N184 Chronic kidney disease, stage 4 (severe): Secondary | ICD-10-CM | POA: Diagnosis not present

## 2021-01-15 DIAGNOSIS — I6783 Posterior reversible encephalopathy syndrome: Secondary | ICD-10-CM | POA: Diagnosis not present

## 2021-01-15 DIAGNOSIS — Z7982 Long term (current) use of aspirin: Secondary | ICD-10-CM | POA: Diagnosis not present

## 2021-01-15 DIAGNOSIS — Z6833 Body mass index (BMI) 33.0-33.9, adult: Secondary | ICD-10-CM | POA: Diagnosis not present

## 2021-01-15 DIAGNOSIS — N179 Acute kidney failure, unspecified: Secondary | ICD-10-CM | POA: Diagnosis present

## 2021-01-15 DIAGNOSIS — I7143 Infrarenal abdominal aortic aneurysm, without rupture: Secondary | ICD-10-CM | POA: Diagnosis present

## 2021-01-15 DIAGNOSIS — Z79899 Other long term (current) drug therapy: Secondary | ICD-10-CM | POA: Diagnosis not present

## 2021-01-15 DIAGNOSIS — I10 Essential (primary) hypertension: Secondary | ICD-10-CM | POA: Diagnosis not present

## 2021-01-15 DIAGNOSIS — R29700 NIHSS score 0: Secondary | ICD-10-CM | POA: Diagnosis present

## 2021-01-15 LAB — URINALYSIS, ROUTINE W REFLEX MICROSCOPIC
Bilirubin Urine: NEGATIVE
Glucose, UA: NEGATIVE mg/dL
Ketones, ur: NEGATIVE mg/dL
Leukocytes,Ua: NEGATIVE
Nitrite: NEGATIVE
Protein, ur: >300 mg/dL — AB
Specific Gravity, Urine: 1.025 (ref 1.005–1.030)
pH: 6 (ref 5.0–8.0)

## 2021-01-15 LAB — LIPID PANEL
Cholesterol: 351 mg/dL — ABNORMAL HIGH (ref 0–200)
HDL: 59 mg/dL (ref >40)
LDL Cholesterol: 253 mg/dL — ABNORMAL HIGH (ref 0–99)
Total CHOL/HDL Ratio: 5.9 RATIO
Triglycerides: 195 mg/dL — ABNORMAL HIGH (ref <150)
VLDL: 39 mg/dL (ref 0–40)

## 2021-01-15 LAB — HEMOGLOBIN A1C
Hgb A1c MFr Bld: 5.5 % (ref 4.8–5.6)
Mean Plasma Glucose: 111.15 mg/dL

## 2021-01-15 LAB — URINALYSIS, MICROSCOPIC (REFLEX)

## 2021-01-15 LAB — BRAIN NATRIURETIC PEPTIDE: B Natriuretic Peptide: 298.4 pg/mL — ABNORMAL HIGH (ref 0.0–100.0)

## 2021-01-15 LAB — RAPID URINE DRUG SCREEN, HOSP PERFORMED
Amphetamines: NOT DETECTED
Barbiturates: NOT DETECTED
Benzodiazepines: NOT DETECTED
Cocaine: NOT DETECTED
Opiates: NOT DETECTED
Tetrahydrocannabinol: NOT DETECTED

## 2021-01-15 LAB — ECHOCARDIOGRAM COMPLETE
AV Peak grad: 11 mmHg
Ao pk vel: 1.66 m/s
Area-P 1/2: 3.87 cm2
S' Lateral: 2.8 cm

## 2021-01-15 LAB — MRSA NEXT GEN BY PCR, NASAL: MRSA by PCR Next Gen: NOT DETECTED

## 2021-01-15 LAB — HIV ANTIBODY (ROUTINE TESTING W REFLEX): HIV Screen 4th Generation wRfx: NONREACTIVE

## 2021-01-15 LAB — LDL CHOLESTEROL, DIRECT: Direct LDL: 257.5 mg/dL — ABNORMAL HIGH (ref 0–99)

## 2021-01-15 LAB — TROPONIN I (HIGH SENSITIVITY): Troponin I (High Sensitivity): 154 ng/L (ref <18)

## 2021-01-15 MED ORDER — AMLODIPINE BESYLATE 5 MG PO TABS: 10.0000 mg | ORAL_TABLET | Freq: Every day | ORAL | Status: DC

## 2021-01-15 MED ORDER — ONDANSETRON HCL 4 MG/2ML IJ SOLN
INTRAMUSCULAR | Status: AC
  Administered 2021-01-15: 4 mg
  Filled 2021-01-15: qty 2

## 2021-01-15 MED ORDER — HYDRALAZINE HCL 50 MG PO TABS
100.0000 mg | ORAL_TABLET | Freq: Three times a day (TID) | ORAL | Status: DC
  Administered 2021-01-15 (×2): 100 mg via ORAL
  Filled 2021-01-15 (×4): qty 2

## 2021-01-15 MED ORDER — ONDANSETRON HCL 4 MG/2ML IJ SOLN: 4.0000 mg | Freq: Four times a day (QID) | INTRAMUSCULAR | Status: DC | PRN

## 2021-01-15 MED ORDER — CLONIDINE HCL 0.1 MG PO TABS
0.1000 mg | ORAL_TABLET | Freq: Once | ORAL | Status: AC
  Administered 2021-01-15: 0.1 mg via ORAL
  Filled 2021-01-15: qty 1

## 2021-01-15 MED ORDER — CLEVIDIPINE BUTYRATE 0.5 MG/ML IV EMUL
0.0000 mg/h | INTRAVENOUS | Status: DC
  Administered 2021-01-15: 16:00:00 16 mg/h via INTRAVENOUS
  Administered 2021-01-15: 9 mg/h via INTRAVENOUS
  Administered 2021-01-15: 1 mg/h via INTRAVENOUS
  Administered 2021-01-15: 11 mg/h via INTRAVENOUS
  Administered 2021-01-16: 6 mg/h via INTRAVENOUS
  Administered 2021-01-16: 3 mg/h via INTRAVENOUS
  Administered 2021-01-16: 6 mg/h via INTRAVENOUS
  Administered 2021-01-16: 09:00:00 1 mg/h via INTRAVENOUS
  Administered 2021-01-17: 7 mg/h via INTRAVENOUS
  Administered 2021-01-17 (×2): 6 mg/h via INTRAVENOUS
  Filled 2021-01-15 (×12): qty 50

## 2021-01-15 MED ORDER — DOCUSATE SODIUM 100 MG PO CAPS
100.0000 mg | ORAL_CAPSULE | Freq: Two times a day (BID) | ORAL | Status: DC | PRN
  Administered 2021-01-17: 100 mg via ORAL
  Filled 2021-01-15: qty 1

## 2021-01-15 MED ORDER — ASPIRIN 81 MG PO CHEW
324.0000 mg | CHEWABLE_TABLET | Freq: Once | ORAL | Status: AC
  Administered 2021-01-15: 324 mg via ORAL
  Filled 2021-01-15: qty 4

## 2021-01-15 MED ORDER — ACETAMINOPHEN 325 MG PO TABS
650.0000 mg | ORAL_TABLET | ORAL | Status: DC | PRN
  Administered 2021-01-15: 650 mg via ORAL
  Filled 2021-01-15: qty 2

## 2021-01-15 MED ORDER — NICARDIPINE HCL IN NACL 20-0.86 MG/200ML-% IV SOLN
3.0000 mg/h | INTRAVENOUS | Status: DC
  Administered 2021-01-15: 5 mg/h via INTRAVENOUS
  Filled 2021-01-15: qty 200

## 2021-01-15 MED ORDER — CARVEDILOL 12.5 MG PO TABS
25.0000 mg | ORAL_TABLET | Freq: Every day | ORAL | Status: DC
  Administered 2021-01-15: 25 mg via ORAL
  Filled 2021-01-15: qty 2

## 2021-01-15 MED ORDER — CHLORHEXIDINE GLUCONATE CLOTH 2 % EX PADS
6.0000 | MEDICATED_PAD | Freq: Every day | CUTANEOUS | Status: DC
  Administered 2021-01-15 – 2021-01-17 (×2): 6 via TOPICAL

## 2021-01-15 MED ORDER — POLYETHYLENE GLYCOL 3350 17 G PO PACK: 17.0000 g | PACK | Freq: Every day | ORAL | Status: DC | PRN

## 2021-01-15 MED ORDER — FUROSEMIDE 10 MG/ML IJ SOLN
60.0000 mg | Freq: Once | INTRAMUSCULAR | Status: AC
Start: 2021-01-15 — End: 2021-01-15
  Administered 2021-01-15: 60 mg via INTRAVENOUS
  Filled 2021-01-15: qty 6

## 2021-01-15 NOTE — H&P (Signed)
NAME:  David [name], MRN:  [id number], DOB:  1970/09/12, LOS: 0 ADMISSION DATE:  01/14/2021, CONSULTATION DATE:  01/15/21 REFERRING MD:  Dina Rich, CHIEF COMPLAINT:  Headache, HTN   History of Present Illness:  David [name] is a 51 y.o. M with PMH significant for CKD IV, poorly controlled HTN, 3cm AAA who noted a worsening frontal headache for the last several days and SBP >200 at home despite taking his anti-hypertensive medications.  Denies illegal drug ise and UDS negative.     In the ED BP was as high as 249/150.  Pt has noted some change in taste, but no vision changes, chest pain, dyspnea, nausea or vomiting.  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  His son massaged this and it improved.    Pt was started on Cardene gtt, labs were significant for acute on chronic renal failure with creatinine of 6 above baseline 3-4, EKG without ischemic changes, MRI brain with numerous chronic lacunar infarcts and micro-hemorrhages with small acute R lacunar infarct and L parietal subacute micro-hemorrhage along with likely PRES.  Pt was started on Cardene gtt.  At the time of exam, BP still >200/100, though patient now denies pain and is alert and oriented.    Pertinent  Medical History   has a past medical history of AAA (abdominal aortic aneurysm) (11/2019), Hypertension, Proteinuria (12/2019), and Vitamin D deficiency. CKD   Significant Hospital Events: Including procedures, antibiotic start and stop dates in addition to other pertinent events   1/6 Admit to PCCM for hypertensive emergency, start on Cleviprex  Interim History / Subjective:  As above  Objective   Blood pressure (!) 178/111, pulse 92, temperature 98.1 F (36.7 C), resp. rate 15, SpO2 98 %.       No intake or output data in the 24 hours ending 01/15/21 1147 There were no vitals filed for this visit.  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  Per last outpatient note, BP has been difficult to control -admit to ICU -started on Cardene initially but still elevated, changed to Cleviprex -Spoke with Neurology and SBP goal for today 180, appreciate recommendations -No sz prophylaxis and completely oriented -obtain Echo -resume po coreg, hydralazine and clonidine -CXR to eval for mediastinal widening, check trop and BNP     History of AAA 5.3cm infrarenal AAA noted on CT abd 11/2019 with possible dissection, no further imaging since then -Pt had transient R flank pain now resolved, will check abdominal US to eval for AAA enlargement, may need CT abd/pelvis -monitor closely in ICU   Acute on Chronic Renal Failure  Baseline CKD stage IV -creatinine bump from baseline ~4 to 6.25 likely from hypertensive emergency -making urine and does not appear volume overloaded, K 3.7 -check renal US -monitor renal indices, electrolytes and UOP and avoid nephrotoxins as able      Best Practice (right click and "  Reselect all SmartList Selections" daily)   Diet/type: Regular consistency (see orders) DVT prophylaxis: SCD GI prophylaxis: N/A Lines: N/A Foley:  N/A Code Status:  full code Last date of multidisciplinary goals of care discussion:   Labs   CBC: Recent Labs  Lab 01/14/21 2208  WBC 4.5  NEUTROABS 2.2  HGB 13.3  HCT 41.8  MCV 84.8  PLT 196    Basic Metabolic  Panel: Recent Labs  Lab 01/14/21 2208  NA 136  K 3.7  CL 102  CO2 25  GLUCOSE 92  BUN 35*  CREATININE 6.25*  CALCIUM 9.0   GFR: CrCl cannot be calculated (Unknown ideal weight.). Recent Labs  Lab 01/14/21 2208  WBC 4.5    Liver Function Tests: Recent Labs  Lab 01/14/21 2208  AST 15  ALT 11  ALKPHOS 95  BILITOT 0.8  PROT 7.1  ALBUMIN 3.8   No results for input(s): LIPASE, AMYLASE in the last 168 hours. No results for input(s): AMMONIA in the last 168 hours.  ABG No results found for: PHART, PCO2ART, PO2ART, HCO3, TCO2, ACIDBASEDEF, O2SAT   Coagulation Profile: No results for input(s): INR, PROTIME in the last 168 hours.  Cardiac Enzymes: No results for input(s): CKTOTAL, CKMB, CKMBINDEX, TROPONINI in the last 168 hours.  HbA1C: Hemoglobin A1C  Date/Time Value Ref Range Status  03/07/2018 09:50 AM 5.2 4.0 - 5.6 % Final  08/15/2016 01:27 PM 5.6  Final    CBG: No results for input(s): GLUCAP in the last 168 hours.  Review of Systems:   Please see the history of present illness. All other systems reviewed and are negative   Past Medical History:  He,  has a past medical history of AAA (abdominal aortic aneurysm) (11/2019), Hypertension, Proteinuria (12/2019), and Vitamin D deficiency.   Surgical History:   Past Surgical History:  Procedure Laterality Date   none       Social History:   reports that he has quit smoking. He has never used smokeless tobacco. He reports that he does not drink alcohol and does not use drugs.   Family History:  His Family history is unknown by patient.   Allergies No Known Allergies   Home Medications  Prior to Admission medications   Medication Sig Start Date End Date Taking? Authorizing Provider  amLODipine (NORVASC) 10 MG tablet TAKE 1 TABLET (10 MG TOTAL) BY MOUTH DAILY. Patient taking differently: Take 10 mg by mouth daily. 12/24/19 01/15/21 Yes Azzie Glatter, FNP  carvedilol (COREG) 25 MG tablet TAKE 1  TABLET (25 MG TOTAL) BY MOUTH 2 (TWO) TIMES DAILY. Patient taking differently: Take 25 mg by mouth at bedtime. 12/24/19 01/15/21 Yes Azzie Glatter, FNP  hydrALAZINE (APRESOLINE) 100 MG tablet TAKE 1 TABLET (100 MG TOTAL) BY MOUTH EVERY 8 (EIGHT) HOURS. Patient taking differently: Take 100 mg by mouth 3 (three) times daily. 09/11/20 09/11/21 Yes Vevelyn Francois, NP  furosemide (LASIX) 40 MG tablet Take 1 tablet (40 mg total) by mouth daily. Patient not taking: Reported on 01/15/2021 07/28/20   Dana Allan I, MD  Iron, Ferrous Sulfate, 325 (65 Fe) MG TABS Take 325 mg by mouth in the morning and at bedtime. Patient not taking: Reported on 01/15/2021 07/28/20   Dana Allan I, MD  buPROPion [facility] SR) 150 MG 12 hr tablet Take 1 tablet (150 mg total) by mouth 2 (two) times daily. 12/24/19 02/05/20  Azzie Glatter, FNP  spironolactone (ALDACTONE) 25 MG tablet Take 1 tablet (25 mg total)  by mouth daily. Patient not taking: No sig reported 12/24/19 03/10/20  Azzie Glatter, FNP     Critical care time: 45 minutes     CRITICAL CARE Performed by: Otilio Carpen Jerrit Horen   Total critical care time: 45 minutes  Critical care time was exclusive of separately billable procedures and treating other patients.  Critical care was necessary to treat or prevent imminent or life-threatening deterioration.  Critical care was time spent personally by me on the following activities: development of treatment plan with patient and/or surrogate as well as nursing, discussions with consultants, evaluation of patient's response to treatment, examination of patient, obtaining history from patient or surrogate, ordering and performing treatments and interventions, ordering and review of laboratory studies, ordering and review of radiographic studies, pulse oximetry and re-evaluation of patient's condition.   Otilio Carpen Chazz Philson, PA-C [facility] See Amion for pager If no response to pager ,  please call [phone] until 7pm After 7:00 pm call Elink  [phone]

## 2021-01-15 NOTE — Progress Notes (Signed)
eLink Physician-Brief Progress Note Patient Name: Alie Hardgrove DOB: June 20, 1970 MRN: [id number]   Date of Service  01/15/2021  HPI/Events of Note  Patient asking for a PRN medication for nausea, QTC is normal.  eICU Interventions  Zofran 4 mg iv Q 6 hours PRN nausea ordered.        Kerry Kass Olivea Sonnen 01/15/2021, 11:12 PM

## 2021-01-15 NOTE — Progress Notes (Signed)
Echocardiogram 2D Echocardiogram has been performed.  David [name] 01/15/2021, 2:47 PM

## 2021-01-15 NOTE — ED Provider Notes (Signed)
[facility] EMERGENCY DEPARTMENT Provider Note   CSN: [id number] Arrival date & time: 01/14/21  1527     History  Chief Complaint  Patient presents with   Hypertension    David [name] is a 51 y.o. male.  HPI  51 year old male with past medical history of HTN, CKD, AAA presents the emergency department with concern for hypertension, headache.  Patient states has been compliant with his home hypertensive medications.  He has been experiencing a headache specifically in the evenings despite compliance with his medications.  Has been taking his blood pressure at home and the top numbers been over 200 which is what prompted him for evaluation.  He denies any other acute neuro symptoms but has had intermittent change in taste.  No seizure-like activity, no recent fever.  Denies any active chest pain, back pain, abdominal pain.  No swelling of his lower extremities.  He has had decreased appetite but denies any vomiting or diarrhea.  Home Medications Prior to Admission medications   Medication Sig Start Date End Date Taking? Authorizing Provider  amLODipine (NORVASC) 10 MG tablet TAKE 1 TABLET (10 MG TOTAL) BY MOUTH DAILY. Patient taking differently: Take 10 mg by mouth daily. 12/24/19 01/15/21 Yes Azzie Glatter, FNP  carvedilol (COREG) 25 MG tablet TAKE 1 TABLET (25 MG TOTAL) BY MOUTH 2 (TWO) TIMES DAILY. Patient taking differently: Take 25 mg by mouth at bedtime. 12/24/19 01/15/21 Yes Azzie Glatter, FNP  hydrALAZINE (APRESOLINE) 100 MG tablet TAKE 1 TABLET (100 MG TOTAL) BY MOUTH EVERY 8 (EIGHT) HOURS. Patient taking differently: Take 100 mg by mouth 3 (three) times daily. 09/11/20 09/11/21 Yes Vevelyn Francois, NP  furosemide (LASIX) 40 MG tablet Take 1 tablet (40 mg total) by mouth daily. Patient not taking: Reported on 01/15/2021 07/28/20   Dana Allan I, MD  Iron, Ferrous Sulfate, 325 (65 Fe) MG TABS Take 325 mg by mouth in the morning and at bedtime. Patient not  taking: Reported on 01/15/2021 07/28/20   Dana Allan I, MD  buPROPion [facility] SR) 150 MG 12 hr tablet Take 1 tablet (150 mg total) by mouth 2 (two) times daily. 12/24/19 02/05/20  Azzie Glatter, FNP  spironolactone (ALDACTONE) 25 MG tablet Take 1 tablet (25 mg total) by mouth daily. Patient not taking: No sig reported 12/24/19 03/10/20  Azzie Glatter, FNP      Allergies    Patient has no known allergies.    Review of Systems   Review of Systems  Constitutional:  Negative for chills and fever.  HENT:  Negative for congestion.   Eyes:  Negative for visual disturbance.  Respiratory:  Negative for chest tightness and shortness of breath.   Cardiovascular:  Negative for chest pain.  Gastrointestinal:  Negative for abdominal pain, diarrhea and vomiting.  Genitourinary:  Negative for dysuria and flank pain.  Musculoskeletal:  Negative for back pain and neck pain.  Skin:  Negative for rash.  Neurological:  Positive for headaches. Negative for dizziness, tremors, seizures, speech difficulty, weakness, light-headedness and numbness.   Physical Exam Updated Vital Signs BP (!) 184/116    Pulse 92    Temp 98.1 F (36.7 C)    Resp 14    SpO2 98%  Physical Exam Vitals and nursing note reviewed.  Constitutional:      General: He is not in acute distress.    Appearance: Normal appearance. He is not diaphoretic.  HENT:     Head: Normocephalic.  Mouth/Throat:     Mouth: Mucous membranes are moist.  Eyes:     Extraocular Movements: Extraocular movements intact.     Pupils: Pupils are equal, round, and reactive to light.  Cardiovascular:     Rate and Rhythm: Normal rate.  Pulmonary:     Effort: Pulmonary effort is normal. No respiratory distress.  Abdominal:     Palpations: Abdomen is soft.     Tenderness: There is no abdominal tenderness.  Musculoskeletal:     Cervical back: No rigidity.  Skin:    General: Skin is warm.  Neurological:     General: No focal deficit  present.     Mental Status: He is alert and oriented to person, place, and time. Mental status is at baseline.     Cranial Nerves: No cranial nerve deficit.  Psychiatric:        Mood and Affect: Mood normal.    ED Results / Procedures / Treatments   Labs (all labs ordered are listed, but only abnormal results are displayed) Labs Reviewed  CBC WITH DIFFERENTIAL/PLATELET - Abnormal; Notable for the following components:      Result Value   RDW 15.9 (*)    All other components within normal limits  COMPREHENSIVE METABOLIC PANEL - Abnormal; Notable for the following components:   BUN 35 (*)    Creatinine, Ser 6.25 (*)    GFR, Estimated 10 (*)    All other components within normal limits  RESP PANEL BY RT-PCR (FLU A&B, COVID) ARPGX2  RAPID URINE DRUG SCREEN, HOSP PERFORMED    EKG EKG Interpretation  Date/Time:  Thursday January 14 2021 22:19:52 EST Ventricular Rate:  73 PR Interval:  184 QRS Duration: 76 QT Interval:  372 QTC Calculation: 409 R Axis:   9 Text Interpretation: Normal sinus rhythm Left ventricular hypertrophy with repolarization abnormality ( Sokolow-Lyon ) Abnormal ECG When compared with ECG of 04-Dec-2019 13:21, PREVIOUS ECG IS PRESENT Confirmed by Lavenia Atlas [phone]) on 01/15/2021 8:49:30 AM  Radiology CT HEAD WO CONTRAST (5MM)  Result Date: 01/14/2021 CLINICAL DATA:  Headache, sudden, severe EXAM: CT HEAD WITHOUT CONTRAST TECHNIQUE: Contiguous axial images were obtained from the base of the skull through the vertex without intravenous contrast. COMPARISON:  08/06/2016 FINDINGS: Brain: Normal anatomic configuration. Patchy bilateral periventricular and subcortical white matter changes are present likely reflecting the sequela of small vessel ischemia. These are relatively advanced given the patient's age in appear progressive since prior examination. Remote lacunar infarct has developed within the left subinsular white matter. Lacunar infarcts within the thalami  bilaterally have developed and are age indeterminate on this examination. No abnormal intra or extra-axial mass lesion or fluid collection. No abnormal mass effect or midline shift. No evidence of acute intracranial hemorrhage. Ventricular size is normal. Cerebellum unremarkable. Vascular: No asymmetric hyperdense vasculature at the skull base. Skull: Intact Sinuses/Orbits: Paranasal sinuses are clear. Orbits are unremarkable. Other: Mastoid air cells and middle ear cavities are clear. IMPRESSION: Age indeterminate lacunar infarcts within the thalami bilaterally. This c would ould be better assessed with contrast enhanced MRI examination. Relatively advanced periventricular white matter changes, progressive since prior examination, of small vessel ischemic change. Likely the sequela Electronically Signed   By: Fidela Salisbury M.D.   On: 01/14/2021 22:42   MR BRAIN WO CONTRAST  Result Date: 01/15/2021 CLINICAL DATA:  51 year old male with sudden severe headache. Hypertension. Severe kidney disease. Age indeterminate small vessel disease on head CT. EXAM: MRI HEAD WITHOUT CONTRAST TECHNIQUE: Multiplanar, multiecho pulse sequences  of the brain and surrounding structures were obtained without intravenous contrast. COMPARISON:  Head CT 01/14/2021, 08/06/2016. FINDINGS: Brain: Small 7 mm focus of restricted diffusion in the right corona radiata near the right lateral ventricle and body of the corpus callosum (series 2, image 34 and series 3, image 17). Multifocal hemosiderin related susceptibility artifact elsewhere on DWI. No other convincing restricted diffusion. However, a small area of microhemorrhage in the medial left periatrial white matter appears to be acute or subacute (series 4, image 17 and series 7, image 51) and is conspicuous on DWI. But there is no mass effect, and no convincing hyperdense hemorrhage here on the recent CT. Fairly extensive underlying chronic microhemorrhages especially in the cerebellum  and brainstem. And superimposed extensive signal heterogeneity in the bilateral deep gray matter nuclei, pons, and deep cerebellar nuclei in keeping with multiple chronic lacunar infarcts. Additional confluent, widespread abnormal bilateral cerebral white matter T2 and FLAIR hyperintense signal. Additionally, there is confluent abnormal T2 and FLAIR hyperintensity in the left cerebellar hemisphere (series 4, images 6 and 10), with perhaps subtle similar signal abnormality on the right (such as the SCA territory on series 4, image 12). And similar subtle cortical and white matter signal changes in the bilateral occipital poles (series 4, image 17) and perhaps also asymmetrically affecting the parietal lobes (image 23). No superimposed midline shift, mass effect, evidence of mass lesion, ventriculomegaly. Cervicomedullary junction and pituitary are within normal limits. Vascular: Major intracranial vascular flow voids are preserved. The right vertebral artery is dominant and mildly dolichoectatic. There is generalized intracranial artery tortuosity, ectasia. Skull and upper cervical spine: Negative visible cervical spine. Visualized bone marrow signal is within normal limits. Sinuses/Orbits: Negative. Paranasal sinuses and mastoids are stable and well aerated. Other: Negative visible internal auditory structures appear normal. Negative visible scalp and face. IMPRESSION: 1. Constellation of very severe chronic small vessel disease with diffuse intracranial arterial ectasia, and numerous chronic micro-hemorrhages and chronic lacunar infarcts. But superimposed: - posterior circulation (left cerebellar predominant) signal abnormality suspicious for mild Posterior Reversible Encephalopathy Syndrome (PRES), - small Acute White Matter Lacunar infarct in the posterior right corona radiata, - and a Subacute Micro-hemorrhage in the left periatrial white matter. Neurology consultation may be valuable. 2. No intracranial mass  effect. Electronically Signed   By: Genevie Ann M.D.   On: 01/15/2021 08:55    Procedures .Critical Care Performed by: Lorelle Gibbs, DO Authorized by: Lorelle Gibbs, DO   Critical care provider statement:    Critical care time (minutes):  60   Critical care time was exclusive of:  Separately billable procedures and treating other patients   Critical care was necessary to treat or prevent imminent or life-threatening deterioration of the following conditions:  Renal failure and CNS failure or compromise   Critical care was time spent personally by me on the following activities:  Development of treatment plan with patient or surrogate, discussions with consultants, evaluation of patient's response to treatment, examination of patient, ordering and review of laboratory studies, ordering and review of radiographic studies, ordering and performing treatments and interventions, pulse oximetry, re-evaluation of patient's condition and review of old charts   I assumed direction of critical care for this patient from another provider in my specialty: no     Care discussed with: admitting provider      Medications Ordered in ED Medications  nicardipine (CARDENE) 20mg  in 0.86% saline 286ml IV infusion (0.1 mg/ml) (7.5 mg/hr Intravenous Rate/Dose Change 01/15/21 1020)  aspirin  chewable tablet 324 mg (324 mg Oral Given 01/15/21 3845)    ED Course/ Medical Decision Making/ A&P                           Medical Decision Making  This patient presents to the ED for concern of hypertension, headaches, this involves an extensive number of treatment options, and is a complaint that carries with it a high risk of complications and morbidity.  The differential diagnosis includes hypertensive urgency, hypertensive emergency, stroke, ICH   Additional history obtained: -Additional history obtained from wife -External records from outside source obtained and reviewed including: Chart review including  previous notes, labs, imaging, consultation notes   Lab Tests: -I ordered, reviewed, and interpreted labs.  The pertinent results include: Worsening kidney dysfunction   EKG -Normal sinus rhythm, nonspecific T wave changes   Imaging Studies ordered: -I ordered imaging studies including CT of the head, MRI of the head -I independently visualized and interpreted imaging which showed new stroke, press syndrome, areas of microhemorrhage -I agree with the radiologist interpretation   Medicines ordered and prescription drug management: -I ordered medication including nicardipine drip for hypertensive emergency -Reevaluation of the patient after these medicines showed that the patient improved -I have reviewed the patients home medicines and have made adjustments as needed   Consultations Obtained: I requested consultation with the neurology and ICU.  Spoke with Dr. Quinn Axe and Dr. Tamala Julian,  and discussed lab and imaging findings as well as pertinent plan - they recommend: ICU admission on nicardipine drip   ED Course: 51 year old male presents emergency department with hypertension, posterior headache.  Neuro intact on my exam, hypertensive with systolics over the 364W.  Patient has been waiting for about 17 to 18 hours prior to my evaluation.  Was an MRI at the start of my shift.  Has concerning findings of renal failure, EKG changes, new strokes on the CT of the head.  MRI confirms strokes with press syndrome as well as small areas of microhemorrhage.  Consulted neurology in regards to best agent for blood pressure lowering, they will see the patient in consult.  Reached out to ICU who will admit the patient on nicardipine drip for further evaluation and treatment.   Critical Interventions: Neurology and ICU consultation, nicardipine drip   Cardiac Monitoring: The patient was maintained on a cardiac monitor.  I personally viewed and interpreted the cardiac monitored which showed an  underlying rhythm of: Sinus rhythm   Reevaluation: After the interventions noted above, I reevaluated the patient and found that they have :improved   Dispostion: Patients evaluation and results requires admission for further treatment and care.  Spoke with ICU , reviewed patient's ED course and they accept admission.  Patient agrees with admission plan, offers no new complaints and is stable/unchanged at time of admit.         Final Clinical Impression(s) / ED Diagnoses Final diagnoses:  None    Rx / DC Orders ED Discharge Orders     None         Lorelle Gibbs, DO 01/15/21 1102

## 2021-01-15 NOTE — Consult Note (Signed)
NEUROLOGY CONSULTATION NOTE   Date of service: January 15, 2021 Patient Name: David [name] MRN:  [id number] DOB:  Dec 23, 1970 Reason for consult: PRES, acute infarct Requesting physician: Dr. Kipp Brood _ _ _   _ __   _ __ _ _  __ __   _ __   __ _  History of Present Illness   51 yo gentleman with pmhx significant for HTN, AAA who presents worsening frontal headache for several days and systolic blood pressure greater than 200 at home despite antihypertensives.  On arrival to the ED blood pressure was 249/150.  Patient's only reported deficits were some change in taste.  No vision changes, nausea, vomiting.  He has no focal weakness or numbness.  No speech impairment or gait disturbance.  Patient was started on a Cardene drip for goal SBP less than 180.  MRI brain was personally reviewed and shows small acute right lacunar infarct, left parietal subacute microhemorrhage, numerous chronic microhemorrhages, numerous lacunar infarcts, and findings consistent with PRES. Vascular surgery has been consulted for recommendations for AAA which has increased in size and also shows intramural thrombus. Patient is on ASA 81mg  daily. Patient has acute on chronic renal failure with creatinine of 6 (baseline 3-4).   ROS   Per HPI: all other systems reviewed and are negative  Past History   I have reviewed the following:  Past Medical History:  Diagnosis Date   AAA (abdominal aortic aneurysm) 11/2019   Hypertension    Proteinuria 12/2019   Vitamin D deficiency    Past Surgical History:  Procedure Laterality Date   none     Family History  Family history unknown: Yes   Social History   Socioeconomic History   Marital status: Married    Spouse name: Not on file   Number of children: 5   Years of education: Not on file   Highest education level: Not on file  Occupational History   Occupation: truck driver  Tobacco Use   Smoking status: Former   Smokeless tobacco: Never  Brewing technologist Use: Never used  Substance and Sexual Activity   Alcohol use: No   Drug use: No   Sexual activity: Not on file  Other Topics Concern   Not on file  Social History Narrative   Not on file   Social Determinants of Health   Financial Resource Strain: Not on file  Food Insecurity: Not on file  Transportation Needs: Not on file  Physical Activity: Not on file  Stress: Not on file  Social Connections: Not on file   No Known Allergies  Medications   Facility-Administered Medications Prior to Admission  Medication Dose Route Frequency Provider Last Rate Last Admin   cloNIDine (CATAPRES) tablet 0.1 mg  0.1 mg Oral Once Dana Allan I, MD       cloNIDine (CATAPRES) tablet 0.1 mg  0.1 mg Oral Once Bonnell Public, MD       Medications Prior to Admission  Medication Sig Dispense Refill Last Dose   amLODipine (NORVASC) 10 MG tablet TAKE 1 TABLET (10 MG TOTAL) BY MOUTH DAILY. (Patient taking differently: Take 10 mg by mouth daily.) 90 tablet 3 01/13/2021   carvedilol (COREG) 25 MG tablet TAKE 1 TABLET (25 MG TOTAL) BY MOUTH 2 (TWO) TIMES DAILY. (Patient taking differently: Take 25 mg by mouth at bedtime.) 180 tablet 3 01/13/2021 at 2100   hydrALAZINE (APRESOLINE) 100 MG tablet TAKE 1 TABLET (100 MG TOTAL) BY MOUTH  EVERY 8 (EIGHT) HOURS. (Patient taking differently: Take 100 mg by mouth 3 (three) times daily.) 90 tablet 3 Past Month   furosemide (LASIX) 40 MG tablet Take 1 tablet (40 mg total) by mouth daily. (Patient not taking: Reported on 01/15/2021) 30 tablet 1 Not Taking   Iron, Ferrous Sulfate, 325 (65 Fe) MG TABS Take 325 mg by mouth in the morning and at bedtime. (Patient not taking: Reported on 01/15/2021) 60 tablet 1 Not Taking      Current Facility-Administered Medications:    carvedilol (COREG) tablet 25 mg, 25 mg, Oral, QHS, Gleason, Otilio Carpen, PA-C   Chlorhexidine Gluconate Cloth 2 % PADS 6 each, 6 each, Topical, Daily, Agarwala, Ravi, MD, 6 each at 01/15/21 1553    clevidipine (CLEVIPREX) infusion 0.5 mg/mL, 0-21 mg/hr, Intravenous, Continuous, Gleason, Otilio Carpen, PA-C, Last Rate: 18 mL/hr at 01/15/21 1900, 9 mg/hr at 01/15/21 1900   docusate sodium (COLACE) capsule 100 mg, 100 mg, Oral, BID PRN, Gleason, Otilio Carpen, PA-C   hydrALAZINE (APRESOLINE) tablet 100 mg, 100 mg, Oral, TID, Gleason, Otilio Carpen, PA-C, 100 mg at 01/15/21 1553   polyethylene glycol (MIRALAX / GLYCOLAX) packet 17 g, 17 g, Oral, Daily PRN, Gleason, Otilio Carpen, PA-C  Vitals   Vitals:   01/15/21 1815 01/15/21 1830 01/15/21 1845 01/15/21 1900  BP: (!) 154/98 (!) 160/105 (!) 164/119 (!) 160/120  Pulse: 78 75 80 83  Resp: 13 14 17 12   Temp:      TempSrc:      SpO2: 97% 96% 96% 97%     There is no height or weight on file to calculate BMI.  Physical Exam   Physical Exam Gen: A&O x4, NAD HEENT: Atraumatic, normocephalic;mucous membranes moist; oropharynx clear, tongue without atrophy or fasciculations. Neck: Supple, trachea midline. Resp: CTAB, no w/r/r CV: RRR, no m/g/r; nml S1 and S2. 2+ symmetric peripheral pulses. Abd: soft/NT/ND; nabs x 4 quad Extrem: Nml bulk; no cyanosis, clubbing, or edema.  Neuro: *MS: A&O x4. Follows multi-step commands.  *Speech: fluid, nondysarthric, able to name and repeat *CN:    I: Deferred   II,III: PERRLA, VFF by confrontation, optic discs unable to be visualized 2/2 pupillary constriction   III,IV,VI: EOMI w/o nystagmus, no ptosis   V: Sensation intact from V1 to V3 to LT   VII: Eyelid closure was full.  Smile symmetric.   VIII: Hearing intact to voice   IX,X: Voice normal, palate elevates symmetrically    XI: SCM/trap 5/5 bilat   XII: Tongue protrudes midline, no atrophy or fasciculations   *Motor:   Normal bulk.  No tremor, rigidity or bradykinesia. No pronator drift.    Strength: Dlt Bic Tri WrE WrF FgS Gr HF KnF KnE PlF DoF    Left 5 5 5 5 5 5 5 5 5 5 5 5     Right 5 5 5 5 5 5 5 5 5 5 5 5     *Sensory: Intact to light touch, pinprick,  temperature vibration throughout. Symmetric. Propioception intact bilat.  No double-simultaneous extinction.  *Coordination:  Finger-to-nose, heel-to-shin, rapid alternating motions were intact. *Reflexes:  2+ and symmetric throughout without clonus; toes down-going bilat *Gait: deferred  NIHSS = 0   Premorbid mRS = 1   Labs   CBC:  Recent Labs  Lab 01/14/21 2208  WBC 4.5  NEUTROABS 2.2  HGB 13.3  HCT 41.8  MCV 84.8  PLT 782    Basic Metabolic Panel:  Lab Results  Component Value Date  NA 136 01/14/2021   K 3.7 01/14/2021   CO2 25 01/14/2021   GLUCOSE 92 01/14/2021   BUN 35 (H) 01/14/2021   CREATININE 6.25 (H) 01/14/2021   CALCIUM 9.0 01/14/2021   GFRNONAA 10 (L) 01/14/2021   GFRAA 19 (L) 03/10/2020   Lipid Panel:  Lab Results  Component Value Date   LDLCALC 135 (H) 12/24/2019   HgbA1c:  Lab Results  Component Value Date   HGBA1C 5.2 03/07/2018   Urine Drug Screen:     Component Value Date/Time   LABOPIA NONE DETECTED 01/15/2021 1147   COCAINSCRNUR NONE DETECTED 01/15/2021 1147   LABBENZ NONE DETECTED 01/15/2021 1147   AMPHETMU NONE DETECTED 01/15/2021 1147   Parchment DETECTED 01/15/2021 1147   LABBARB NONE DETECTED 01/15/2021 1147    Alcohol Level No results found for: [place]   Impression   51 yo gentleman with pmhx significant for HTN, AAA who presents worsening frontal headache for several days and systolic blood pressure greater than 200 at home despite antihypertensives.  MRI brain shows findings c/w small acute lacunar infarct, numerous chronic microhemorrhages and possibly one subacute, and PRES.  Recommendations   # Stroke workup - Cardene gtt for SBP <180 in setting of PRES and numerous chronic microhemorrhages. Avoid hypotension.  - MRI brain wo contrast - MRA H&N - TTE w/ bubble - Check A1c and LDL + add statin per guidelines - ASA 81mg  daily. Patient not started on DAPT 2/2 uncontrolled BP (SBP 200 on cardene when I saw him) and  numerous chronic microhemorrhages - q4 hr neuro checks - STAT head CT for any change in neuro exam - Tele - PT/OT/SLP - Stroke education - Amb referral to neurology upon discharge   # PRES - BP mgmt per above - No e/o seizures, no indication for EEG or AED at this time  Stroke team will assume care tomorrow.  ______________________________________________________________________   Thank you for the opportunity to take part in the care of this patient. If you have any further questions, please contact the neurology consultation attending.  Signed,  Su Monks, MD [facility] [phone]  If 7pm- 7am, please page neurology on call as listed in [place].

## 2021-01-16 ENCOUNTER — Inpatient Hospital Stay (HOSPITAL_COMMUNITY): Payer: BC Managed Care – PPO

## 2021-01-16 DIAGNOSIS — I6783 Posterior reversible encephalopathy syndrome: Secondary | ICD-10-CM

## 2021-01-16 DIAGNOSIS — I6330 Cerebral infarction due to thrombosis of unspecified cerebral artery: Secondary | ICD-10-CM | POA: Diagnosis not present

## 2021-01-16 DIAGNOSIS — I7143 Infrarenal abdominal aortic aneurysm, without rupture: Secondary | ICD-10-CM

## 2021-01-16 DIAGNOSIS — I10 Essential (primary) hypertension: Secondary | ICD-10-CM

## 2021-01-16 DIAGNOSIS — N184 Chronic kidney disease, stage 4 (severe): Secondary | ICD-10-CM | POA: Diagnosis not present

## 2021-01-16 DIAGNOSIS — I619 Nontraumatic intracerebral hemorrhage, unspecified: Secondary | ICD-10-CM | POA: Insufficient documentation

## 2021-01-16 DIAGNOSIS — F17200 Nicotine dependence, unspecified, uncomplicated: Secondary | ICD-10-CM

## 2021-01-16 DIAGNOSIS — I609 Nontraumatic subarachnoid hemorrhage, unspecified: Secondary | ICD-10-CM

## 2021-01-16 DIAGNOSIS — I161 Hypertensive emergency: Secondary | ICD-10-CM | POA: Diagnosis not present

## 2021-01-16 DIAGNOSIS — I6340 Cerebral infarction due to embolism of unspecified cerebral artery: Secondary | ICD-10-CM | POA: Insufficient documentation

## 2021-01-16 DIAGNOSIS — E782 Mixed hyperlipidemia: Secondary | ICD-10-CM

## 2021-01-16 LAB — BASIC METABOLIC PANEL
Anion gap: 8 (ref 5–15)
BUN: 38 mg/dL — ABNORMAL HIGH (ref 6–20)
CO2: 25 mmol/L (ref 22–32)
Calcium: 9 mg/dL (ref 8.9–10.3)
Chloride: 100 mmol/L (ref 98–111)
Creatinine, Ser: 6.03 mg/dL — ABNORMAL HIGH (ref 0.61–1.24)
GFR, Estimated: 11 mL/min — ABNORMAL LOW (ref >60)
Glucose, Bld: 117 mg/dL — ABNORMAL HIGH (ref 70–99)
Potassium: 3.9 mmol/L (ref 3.5–5.1)
Sodium: 133 mmol/L — ABNORMAL LOW (ref 135–145)

## 2021-01-16 LAB — CBC
HCT: 39.6 % (ref 39.0–52.0)
Hemoglobin: 13 g/dL (ref 13.0–17.0)
MCH: 27.1 pg (ref 26.0–34.0)
MCHC: 32.8 g/dL (ref 30.0–36.0)
MCV: 82.7 fL (ref 80.0–100.0)
Platelets: 158 10*3/uL (ref 150–400)
RBC: 4.79 MIL/uL (ref 4.22–5.81)
RDW: 15.7 % — ABNORMAL HIGH (ref 11.5–15.5)
WBC: 4.8 10*3/uL (ref 4.0–10.5)
nRBC: 0 % (ref 0.0–0.2)

## 2021-01-16 LAB — MAGNESIUM: Magnesium: 2.3 mg/dL (ref 1.7–2.4)

## 2021-01-16 MED ORDER — CLONIDINE HCL 0.1 MG PO TABS
0.1000 mg | ORAL_TABLET | Freq: Two times a day (BID) | ORAL | Status: DC
  Administered 2021-01-16 (×2): 0.1 mg via ORAL
  Filled 2021-01-16 (×2): qty 1

## 2021-01-16 MED ORDER — ASPIRIN EC 81 MG PO TBEC
81.0000 mg | DELAYED_RELEASE_TABLET | Freq: Every day | ORAL | Status: DC
  Administered 2021-01-16 – 2021-01-18 (×3): 81 mg via ORAL
  Filled 2021-01-16 (×3): qty 1

## 2021-01-16 MED ORDER — AMLODIPINE BESYLATE 10 MG PO TABS
10.0000 mg | ORAL_TABLET | Freq: Every day | ORAL | Status: DC
  Administered 2021-01-16 – 2021-01-18 (×3): 10 mg via ORAL
  Filled 2021-01-16 (×3): qty 1

## 2021-01-16 MED ORDER — LABETALOL HCL 5 MG/ML IV SOLN
10.0000 mg | INTRAVENOUS | Status: DC | PRN
  Administered 2021-01-16: 10 mg via INTRAVENOUS
  Filled 2021-01-16 (×3): qty 4

## 2021-01-16 MED ORDER — CARVEDILOL 12.5 MG PO TABS
25.0000 mg | ORAL_TABLET | Freq: Two times a day (BID) | ORAL | Status: DC
  Administered 2021-01-16 – 2021-01-18 (×3): 25 mg via ORAL
  Filled 2021-01-16 (×3): qty 2

## 2021-01-16 MED ORDER — ATORVASTATIN CALCIUM 80 MG PO TABS
80.0000 mg | ORAL_TABLET | Freq: Every day | ORAL | Status: DC
  Administered 2021-01-16 – 2021-01-18 (×3): 80 mg via ORAL
  Filled 2021-01-16 (×3): qty 1

## 2021-01-16 NOTE — Evaluation (Signed)
Occupational Therapy Evaluation Patient Details Name: David [name] MRN: [id number] DOB: May 17, 1970 Today's Date: 01/16/2021   History of Present Illness 51 y/o male presented to ED on 01/14/21 for HTN x 2 days. MRI showed numerous chronic lacunar infarcts and micro-hemorrhages with small acute R lacunar infarct and L parietal subacute micro-hemorrhage along with likely PRES. PMH: AAA, HTN, CKD   Clinical Impression   PTA pt lives with his wife and works driving an Health visitor truck. Pt appears close to his baseline. Pt expressing concern regarding controlling his BP. No further OT needed. OT signing off.     Recommendations for follow up therapy are one component of a multi-disciplinary discharge planning process, led by the attending physician.  Recommendations may be updated based on patient status, additional functional criteria and insurance authorization.   Follow Up Recommendations  No OT follow up    Assistance Recommended at Discharge None  Patient can return home with the following      Functional Status Assessment  Patient has not had a recent decline in their functional status  Equipment Recommendations  None recommended by OT    Recommendations for Other Services       Precautions / Restrictions Precautions Precautions: None Precaution Comments: SBP<160 Restrictions Weight Bearing Restrictions: No      Mobility Bed Mobility Overal bed mobility: Independent                  Transfers Overall transfer level: Independent                        Balance Overall balance assessment: No apparent balance deficits (not formally assessed)                                         ADL either performed or assessed with clinical judgement   ADL Overall ADL's : At baseline                                             Vision Baseline Vision/History: 1 Wears glasses Vision Assessment?: No apparent visual deficits      Perception Perception Comments: appears [facility]   Praxis Praxis Praxis-Other Comments: WFL    Pertinent Vitals/Pain Pain Assessment: No/denies pain     Hand Dominance Left   Extremity/Trunk Assessment Upper Extremity Assessment Upper Extremity Assessment: Overall WFL for tasks assessed   Lower Extremity Assessment Lower Extremity Assessment: Defer to PT evaluation   Cervical / Trunk Assessment Cervical / Trunk Assessment: Normal   Communication Communication Communication: No difficulties (heavy accent)   Cognition Arousal/Alertness: Awake/alert Behavior During Therapy: WFL for tasks assessed/performed Overall Cognitive Status: Within Functional Limits for tasks assessed                                       General Comments  Pt discussed his concerns over his HTN    Exercises     Shoulder Instructions      Home Living Family/patient expects to be discharged to:: Private residence Living Arrangements: Spouse/significant other Available Help at Discharge: Family Type of Home: House Home Access: Level entry     Home Layout: Two level Alternate Level  Stairs-Number of Steps: 8   Bathroom Shower/Tub: Occupational psychologist: Standard Bathroom Accessibility: Yes   Home Equipment: None          Prior Functioning/Environment Prior Level of Function : Independent/Modified Independent;Working/employed;Driving             Mobility Comments: works as Solicitor List:        OT Treatment/Interventions:      OT Goals(Current goals can be found in the care plan section) Acute Rehab OT Goals Patient Stated Goal: to get better control of his blodd pressure OT Goal Formulation: All assessment and education complete, DC therapy  OT Frequency:      Co-evaluation              AM-PAC OT "6 Clicks" Daily Activity     Outcome Measure Help from another person eating meals?: None Help from another person  taking care of personal grooming?: None Help from another person toileting, which includes using toliet, bedpan, or urinal?: None Help from another person bathing (including washing, rinsing, drying)?: None Help from another person to put on and taking off regular upper body clothing?: None Help from another person to put on and taking off regular lower body clothing?: None 6 Click Score: 24   End of Session Nurse Communication: Mobility status  Activity Tolerance: Patient tolerated treatment well Patient left: in chair;with call bell/phone within reach                   Time: 1517-1537 OT Time Calculation (min): 20 min Charges:  OT General Charges $OT Visit: 1 Visit OT Evaluation $OT Eval Moderate Complexity: [place], OT/L   Acute OT Clinical Specialist Acute Rehabilitation Services Pager [phone] Office [phone]   [facility] 01/16/2021, 3:42 PM

## 2021-01-16 NOTE — Progress Notes (Addendum)
NAME:  David [name], MRN:  [id number], DOB:  [birth year], LOS: 1 ADMISSION DATE:  01/14/2021, CONSULTATION DATE:  01/16/21 REFERRING MD:  Dina Rich, CHIEF COMPLAINT:  Headache, HTN   History of Present Illness:  David [name] is a 51 y.o. M with PMH significant for CKD IV, poorly controlled HTN, 3cm AAA who noted a worsening frontal headache for the last several days and SBP >200 at home despite taking his anti-hypertensive medications.  Denies illegal drug ise and UDS negative.   In the ED BP was as high as 249/150.  Pt has noted some change in taste, but no vision changes, chest pain, dyspnea, nausea or vomiting.  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  His son massaged this and it improved.    Pt was started on Cardene gtt, labs were significant for acute on chronic renal failure with creatinine of 6 above baseline 3-4, EKG without ischemic changes, MRI brain with numerous chronic lacunar infarcts and micro-hemorrhages with small acute R lacunar infarct and L parietal subacute micro-hemorrhage along with likely PRES.  Pt was started on Cardene gtt.  At the time of exam, BP still >200/100, though patient now denies pain and is alert and oriented.   Pertinent  Medical History   has a past medical history of AAA (abdominal aortic aneurysm) (11/2019), Hypertension, Proteinuria (12/2019), and Vitamin D deficiency. CKD   Significant Hospital Events: Including procedures, antibiotic start and stop dates in addition to other pertinent events   1/6 Admit to PCCM for hypertensive emergency, start on Cleviprex  Interim History / Subjective:   Off Cleviprex drip.  No acute events overnight next  Objective   Blood pressure (!) 189/109, pulse 74, temperature 98.1 F (36.7 C), temperature source Oral, resp. rate 11, SpO2 98 %.        Intake/Output Summary (Last 24 hours) at 01/16/2021 0840 Last data filed at 01/16/2021 0100 Gross per 24 hour   Intake 293.28 ml  Output 1050 ml  Net -756.72 ml   Blood pressure (!) 189/109, pulse 74, temperature 98.1 F (36.7 C), temperature source Oral, resp. rate 11, SpO2 98 %. Gen:      No acute distress HEENT:  EOMI, sclera anicteric Neck:     No masses; no thyromegaly Lungs:    Clear to auscultation bilaterally; normal respiratory effort CV:         Regular rate and rhythm; no murmurs Abd:      + bowel sounds; soft, non-tender; no palpable masses, no distension Ext:    No edema; adequate peripheral perfusion Skin:      Warm and dry; no rash Neuro: alert and oriented x 3 Psych: normal mood and affect   Labs/imaging reviewed Significant for BUN/creatinine 38/6.03, sodium 133 CBC stable  Abnormal ultrasound noted with 6.1 cm infrarenal aneurysm and echogenic mural thrombus  Resolved Hospital Problem list     Assessment & Plan:    Hypertensive Emergency with acute R lacunar CVA and numerous chronic lacunar infarcts and microhemorrhages  Pt reports compliance with home Norvasc, coreg, Hydralazine and clonidine, reports stopped Lasix.  Per last outpatient note, BP has been difficult to control Weaning off Cleviprex drip. Restarted Coreg and Norvasc Is unable to take hydralazine as it causes dizziness Will start clonidine Labetalol as needed  History of AAA Abdominal ultrasound noted with infrarenal 6.1 cm abdominal aneurysm Discussed with vascular surgery who will follow.  We will get  CT with contrast when creatinine is improved The thrombus is he does have a mural thrombus with no need for anticoagulation at present  Diastolic heart failure Echo reviewed with possibility of amyloid Will need cardiac MRI as an outpatient  Active smoker Chest x-ray shows some right hilar prominence.  Would suggest CT chest at some point for evaluation.  Acute on Chronic Renal Failure  Baseline CKD stage IV -creatinine bump from baseline ~4 to 6.25 likely from hypertensive emergency Follow  urine output and creatinine Holding Lasix   Best Practice (right click and "Reselect all SmartList Selections" daily)   Diet/type: Regular consistency (see orders) DVT prophylaxis: SCD GI prophylaxis: N/A Lines: N/A Foley:  N/A Code Status:  full code Last date of multidisciplinary goals of care discussion:   Critical care time:   The patient is critically ill with multiple organ system failure and requires high complexity decision making for assessment and support, frequent evaluation and titration of therapies, advanced monitoring, review of radiographic studies and interpretation of complex data.   Critical Care Time devoted to patient care services, exclusive of separately billable procedures, described in this note is 45.  Minutes.   Marshell Garfinkel MD [facility] See Amion for pager  If no response to pager , please call [phone] until 7pm After 7:00 pm call Elink  [phone] 01/16/2021, 9:23 AM

## 2021-01-16 NOTE — Evaluation (Signed)
Physical Therapy Evaluation & Discharge                                        Patient Details Name: David [name] MRN: [id number] DOB: 1970/06/06 Today's Date: 01/16/2021  History of Present Illness  51 y/o male presented to ED on 01/14/21 for HTN x 2 days. MRI showed numerous chronic lacunar infarcts and micro-hemorrhages with small acute R lacunar infarct and L parietal subacute micro-hemorrhage along with likely PRES. PMH: AAA, HTN, CKD  Clinical Impression  Patient admitted with above diagnosis. Patient currently functioning at independent level for mobility. Patient with no apparent balance deficits and is able to negotiate stairs to access home environment. Educated patient on importance of mobility on recovery. No further skilled PT needs required acutely. No PT follow up recommended at this time.                                                         Recommendations for follow up therapy are one component of a multi-disciplinary discharge planning process, led by the attending physician.  Recommendations may be updated based on patient status, additional functional criteria and insurance authorization.  Follow Up Recommendations No PT follow up    Assistance Recommended at Discharge PRN  Patient can return home with the following       Equipment Recommendations None recommended by PT  Recommendations for Other Services       Functional Status Assessment Patient has not had a recent decline in their functional status     Precautions / Restrictions Precautions Precautions: None Precaution Comments: SBP<160 Restrictions Weight Bearing Restrictions: No      Mobility  Bed Mobility Overal bed mobility: Independent                  Transfers Overall transfer level: Independent                      Ambulation/Gait Ambulation/Gait assistance: Independent Gait Distance (Feet): 250 Feet Assistive device: None Gait Pattern/deviations: WFL(Within Functional  Limits)          Stairs Stairs: Yes Stairs assistance: Independent Stair Management: No rails;Alternating pattern;Forwards Number of Stairs: 3    Wheelchair Mobility    Modified Rankin (Stroke Patients Only) Modified Rankin (Stroke Patients Only) Pre-Morbid Rankin Score: No symptoms Modified Rankin: No symptoms     Balance Overall balance assessment: No apparent balance deficits (not formally assessed)                                           Pertinent Vitals/Pain Pain Assessment: No/denies pain    Home Living Family/patient expects to be discharged to:: Private residence Living Arrangements: Spouse/significant other Available Help at Discharge: Family Type of Home: House Home Access: Level entry     Alternate Level Stairs-Number of Steps: 8 Home Layout: Two level Home Equipment: None      Prior Function Prior Level of Function : Independent/Modified Independent;Working/employed;Driving             Mobility Comments: works as Conservation officer, historic buildings  Hand Dominance        Extremity/Trunk Assessment   Upper Extremity Assessment Upper Extremity Assessment: Defer to OT evaluation    Lower Extremity Assessment Lower Extremity Assessment: Overall WFL for tasks assessed    Cervical / Trunk Assessment Cervical / Trunk Assessment: Normal  Communication   Communication: No difficulties  Cognition Arousal/Alertness: Awake/alert Behavior During Therapy: WFL for tasks assessed/performed Overall Cognitive Status: Within Functional Limits for tasks assessed                                          General Comments      Exercises     Assessment/Plan    PT Assessment Patient does not need any further PT services  PT Problem List         PT Treatment Interventions      PT Goals (Current goals can be found in the Care Plan section)  Acute Rehab PT Goals Patient Stated Goal: to go home PT Goal Formulation: All  assessment and education complete, DC therapy    Frequency       Co-evaluation               AM-PAC PT "6 Clicks" Mobility  Outcome Measure Help needed turning from your back to your side while in a flat bed without using bedrails?: None Help needed moving from lying on your back to sitting on the side of a flat bed without using bedrails?: None Help needed moving to and from a bed to a chair (including a wheelchair)?: None Help needed standing up from a chair using your arms (e.g., wheelchair or bedside chair)?: None Help needed to walk in hospital room?: None Help needed climbing 3-5 steps with a railing? : None 6 Click Score: 24    End of Session Equipment Utilized During Treatment: Gait belt Activity Tolerance: Patient tolerated treatment well Patient left: in chair;with call bell/phone within reach Nurse Communication: Mobility status PT Visit Diagnosis: Muscle weakness (generalized) (M62.81)    Time: 6568-1275 PT Time Calculation (min) (ACUTE ONLY): 22 min   Charges:   PT Evaluation $PT Eval Low Complexity: 1 Low          Raphel Stickles A. Gilford Rile PT, DPT Acute Rehabilitation Services Pager [phone] Office [phone]   Linna Hoff 01/16/2021, 3:33 PM

## 2021-01-16 NOTE — Consult Note (Signed)
Hospital Consult    Reason for Consult: Incidental finding of 6.1 cm abdominal aortic aneurysm Requesting Physician: Dr. Hart Robinsons    MRN #:  [id number]  History of Present Illness: This is a 51 y.o. male history of CKD 4, hypertension, known 3 cm AAA who presented to the hospital assisted systolic blood pressure greater than 200 and worsening frontal headache.  On initial presentation, labs were notable for acute on chronic renal failure with a creatinine above 6 MRI of the brain demonstrated chronic lacunar strokes and microhemorrhages with small right lacunar infarct and left parietal subacute microhemorrhage along with possible PRES. An abdominal ultrasound was ordered due to his renal failure and for further evaluation of the abdominal aortic aneurysm.  This demonstrated growth to 6.1 cm.  Last year, the aneurysm measured 5.3 cm.  Vascular surgery was called for further recommendations.   On exam, Aryon was resting comfortably.  He denied new onset back, abdominal, chest pain. Pt immigrated from the [place] in 2005, currently working as a Administrator.  He was aware of his abdominal aortic aneurysm, and was seen by our group roughly a year ago.  He missed his 79-month follow-up appointment.  He has no family history.  Past Medical History:  Diagnosis Date   AAA (abdominal aortic aneurysm) 11/2019   Hypertension    Proteinuria 12/2019   Vitamin D deficiency     Past Surgical History:  Procedure Laterality Date   none      No Known Allergies  Prior to Admission medications   Medication Sig Start Date End Date Taking? Authorizing Provider  amLODipine (NORVASC) 10 MG tablet TAKE 1 TABLET (10 MG TOTAL) BY MOUTH DAILY. Patient taking differently: Take 10 mg by mouth daily. 12/24/19 01/15/21 Yes Azzie Glatter, FNP  carvedilol (COREG) 25 MG tablet TAKE 1 TABLET (25 MG TOTAL) BY MOUTH 2 (TWO) TIMES DAILY. Patient taking differently: Take 25 mg by mouth at bedtime. 12/24/19  01/15/21 Yes Azzie Glatter, FNP  hydrALAZINE (APRESOLINE) 100 MG tablet TAKE 1 TABLET (100 MG TOTAL) BY MOUTH EVERY 8 (EIGHT) HOURS. Patient taking differently: Take 100 mg by mouth 3 (three) times daily. 09/11/20 09/11/21 Yes Vevelyn Francois, NP  furosemide (LASIX) 40 MG tablet Take 1 tablet (40 mg total) by mouth daily. Patient not taking: Reported on 01/15/2021 07/28/20   Dana Allan I, MD  Iron, Ferrous Sulfate, 325 (65 Fe) MG TABS Take 325 mg by mouth in the morning and at bedtime. Patient not taking: Reported on 01/15/2021 07/28/20   Dana Allan I, MD  buPROPion [facility] SR) 150 MG 12 hr tablet Take 1 tablet (150 mg total) by mouth 2 (two) times daily. 12/24/19 02/05/20  Azzie Glatter, FNP  spironolactone (ALDACTONE) 25 MG tablet Take 1 tablet (25 mg total) by mouth daily. Patient not taking: No sig reported 12/24/19 03/10/20  Azzie Glatter, FNP    Social History   Socioeconomic History   Marital status: Married    Spouse name: Not on file   Number of children: 5   Years of education: Not on file   Highest education level: Not on file  Occupational History   Occupation: truck driver  Tobacco Use   Smoking status: Former   Smokeless tobacco: Never  Vaping Use   Vaping Use: Never used  Substance and Sexual Activity   Alcohol use: No   Drug use: No   Sexual activity: Not on file  Other Topics Concern   Not on  file  Social History Narrative   Not on file   Social Determinants of Health   Financial Resource Strain: Not on file  Food Insecurity: Not on file  Transportation Needs: Not on file  Physical Activity: Not on file  Stress: Not on file  Social Connections: Not on file  Intimate Partner Violence: Not on file     Family History  Family history unknown: Yes    ROS: Otherwise negative unless mentioned in HPI  Physical Examination  Vitals:   01/16/21 0800 01/16/21 1025  BP:  (!) 175/111  Pulse:    Resp:    Temp: 98.1 F (36.7 C)   SpO2:      There is no height or weight on file to calculate BMI.  General:  WDWN in NAD Gait: Not observed HENT: WNL, normocephalic Pulmonary: normal non-labored breathing, without Rales, rhonchi,  wheezing Cardiac: regular Abdomen:  soft, NT/ND, palpable aneurysm  Skin: without rashes Vascular Exam/Pulses: 2 femoral pulses bilaterally, 2+ DP/ PT,  Extremities: without ischemic changes, without Gangrene , without cellulitis; without open wounds;  Musculoskeletal: no muscle wasting or atrophy  Neurologic: A&O X 3;  No focal weakness or paresthesias are detected; speech is fluent/normal Psychiatric:  The pt has Normal affect. Lymph:  Unremarkable  CBC    Component Value Date/Time   WBC 4.8 01/16/2021 0438   RBC 4.79 01/16/2021 0438   HGB 13.0 01/16/2021 0438   HGB 11.8 (L) 12/24/2019 1345   HCT 39.6 01/16/2021 0438   HCT 37.7 12/24/2019 1345   PLT 158 01/16/2021 0438   PLT 398 12/24/2019 1345   MCV 82.7 01/16/2021 0438   MCV 83 12/24/2019 1345   MCH 27.1 01/16/2021 0438   MCHC 32.8 01/16/2021 0438   RDW 15.7 (H) 01/16/2021 0438   RDW 14.0 12/24/2019 1345   LYMPHSABS 1.4 01/14/2021 2208   LYMPHSABS 0.8 12/24/2019 1345   MONOABS 0.6 01/14/2021 2208   EOSABS 0.2 01/14/2021 2208   EOSABS 0.1 12/24/2019 1345   BASOSABS 0.0 01/14/2021 2208   BASOSABS 0.0 12/24/2019 1345    BMET    Component Value Date/Time   NA 133 (L) 01/16/2021 0438   NA 139 06/25/2020 1411   K 3.9 01/16/2021 0438   CL 100 01/16/2021 0438   CO2 25 01/16/2021 0438   GLUCOSE 117 (H) 01/16/2021 0438   BUN 38 (H) 01/16/2021 0438   BUN 30 (H) 06/25/2020 1411   CREATININE 6.03 (H) 01/16/2021 0438   CREATININE 3.83 (H) 07/28/2020 1529   CALCIUM 9.0 01/16/2021 0438   GFRNONAA 11 (L) 01/16/2021 0438   GFRNONAA 16 (L) 03/10/2020 0000   GFRAA 19 (L) 03/10/2020 0000    COAGS: Lab Results  Component Value Date   INR 1.00 02/27/2018     Non-Invasive Vascular Imaging:   IMPRESSION: 1. Aneurysm of the  infrarenal abdominal aorta measuring up to 6.1 cm, with echogenic mural thrombus. Recommend CT angiogram to further evaluate. 2. Increased renal cortical echogenicity, consistent with medical renal disease. No hydronephrosis.  Statin:  No. Beta Blocker:  Yes.   Aspirin:  No. ACEI:  No. ARB:  Yes.   CCB use:  Yes Other antiplatelets/anticoagulants:  No.    ASSESSMENT/PLAN: This is a 51 y.o. male admitted with hypertensive emergency, currently in renal failure, with known infrarenal abdominal aortic aneurysm.  Over the last year, the aneurysm is grown from 5.3 cm to 6.1 cm.  This was appreciated on recent ultrasound.  Mural thrombus was also appreciated in the  ultrasound.  This does not need to be anticoagulated as it is on the wall of the aorta.   At 6.1 cm, the patient would benefit from abdominal aortic aneurysm repair to mitigate the risk of rupture.  Prior to this, Aden must undergo CT angiogram in an effort to define the anatomy of the aneurysm.  If a candidate for endovascular repair, he would also need contrast at the time of the operation.  With his current renal function, the work-up and intervention would likely cause renal failure.  I will continue to follow the patient while in the hospital and would appreciate CT angio abdomen pelvis should his creatinine improve.  Should he continue to worsen, I have also happy to have the discussion of tunneled HD line and fistula creation prior to aneurysm repair.  I had a long discussion with Muaz about the above, as well as the signs and symptoms of rupture.  He was asked to call his nurse immediately should new onset severe back, abdominal, chest pain develop.  Patient would benefit from high intensity statin, aspirin therapy daily to mitigate the risks of further cerebrovascular events, and/or cardiac events.    Cassandria Santee MD [facility] [phone] 01/16/2021  10:49 AM

## 2021-01-16 NOTE — Progress Notes (Signed)
STROKE TEAM PROGRESS NOTE   INTERVAL HISTORY His RN and Dr. Vaughan Browner are at the bedside.  Patient lying in bed, awake alert, no complaints, neurologically intact.  BP still on high side, 190s, will gradually normalize BP in 2 to 3 days.  Still on Cleviprex drip, will add more BP p.o. meds.   OBJECTIVE Vitals:   01/16/21 1745 01/16/21 1800 01/16/21 1815 01/16/21 1830  BP: (!) 128/98 (!) 151/102 (!) 138/96 (!) 150/102  Pulse:      Resp: 19 13 13 13   Temp:      TempSrc:      SpO2:        CBC:  Recent Labs  Lab 01/14/21 2208 01/16/21 0438  WBC 4.5 4.8  NEUTROABS 2.2  --   HGB 13.3 13.0  HCT 41.8 39.6  MCV 84.8 82.7  PLT 167 867    Basic Metabolic Panel:  Recent Labs  Lab 01/14/21 2208 01/16/21 0438  NA 136 133*  K 3.7 3.9  CL 102 100  CO2 25 25  GLUCOSE 92 117*  BUN 35* 38*  CREATININE 6.25* 6.03*  CALCIUM 9.0 9.0  MG  --  2.3    Lipid Panel:     Component Value Date/Time   CHOL 351 (H) 01/15/2021 2010   CHOL 189 12/24/2019 1345   TRIG 195 (H) 01/15/2021 2010   HDL 59 01/15/2021 2010   HDL 38 (L) 12/24/2019 1345   CHOLHDL 5.9 01/15/2021 2010   VLDL 39 01/15/2021 2010   LDLCALC 253 (H) 01/15/2021 2010   LDLCALC 135 (H) 12/24/2019 1345   HgbA1c:  Lab Results  Component Value Date   HGBA1C 5.5 01/15/2021   Urine Drug Screen:     Component Value Date/Time   LABOPIA NONE DETECTED 01/15/2021 1147   COCAINSCRNUR NONE DETECTED 01/15/2021 1147   LABBENZ NONE DETECTED 01/15/2021 1147   AMPHETMU NONE DETECTED 01/15/2021 1147   THCU NONE DETECTED 01/15/2021 1147   LABBARB NONE DETECTED 01/15/2021 1147    Alcohol Level No results found for: ETH  IMAGING   CT HEAD WO CONTRAST (5MM)  Result Date: 01/14/2021 CLINICAL DATA:  Headache, sudden, severe EXAM: CT HEAD WITHOUT CONTRAST TECHNIQUE: Contiguous axial images were obtained from the base of the skull through the vertex without intravenous contrast. COMPARISON:  08/06/2016 FINDINGS: Brain: Normal anatomic  configuration. Patchy bilateral periventricular and subcortical white matter changes are present likely reflecting the sequela of small vessel ischemia. These are relatively advanced given the patient's age in appear progressive since prior examination. Remote lacunar infarct has developed within the left subinsular white matter. Lacunar infarcts within the thalami bilaterally have developed and are age indeterminate on this examination. No abnormal intra or extra-axial mass lesion or fluid collection. No abnormal mass effect or midline shift. No evidence of acute intracranial hemorrhage. Ventricular size is normal. Cerebellum unremarkable. Vascular: No asymmetric hyperdense vasculature at the skull base. Skull: Intact Sinuses/Orbits: Paranasal sinuses are clear. Orbits are unremarkable. Other: Mastoid air cells and middle ear cavities are clear. IMPRESSION: Age indeterminate lacunar infarcts within the thalami bilaterally. This c would ould be better assessed with contrast enhanced MRI examination. Relatively advanced periventricular white matter changes, progressive since prior examination, of small vessel ischemic change. Likely the sequela Electronically Signed   By: Fidela Salisbury M.D.   On: 01/14/2021 22:42   MR ANGIO HEAD WO CONTRAST  Result Date: 01/16/2021 CLINICAL DATA:  Acute neurologic deficit EXAM: MRA HEAD WITHOUT CONTRAST TECHNIQUE: Angiographic images of the [place] were acquired using MRA technique without intravenous contrast. COMPARISON:  No pertinent prior exam. FINDINGS: POSTERIOR CIRCULATION: --Vertebral arteries: Mild ectasia of the right V4 segment. Normal left. --Inferior cerebellar arteries: Normal. --Basilar artery: Normal. --Superior cerebellar arteries: Normal. --Posterior cerebral arteries: Normal. ANTERIOR CIRCULATION: --Intracranial internal carotid arteries: Normal. --Anterior cerebral arteries (ACA): Normal. --Middle cerebral arteries (MCA): Dilated appearance of the  right M1 segment. Severe stenosis versus short segment occlusion of the proximal left M2 segment. The more distal left M2 segment is normal. ANATOMIC VARIANTS: None IMPRESSION: 1. No emergent large vessel occlusion. 2. Severe stenosis versus short segment occlusion of the proximal left M2 segment. Electronically Signed   By: Ulyses Jarred M.D.   On: 01/16/2021 03:43   MR BRAIN WO CONTRAST  Result Date: 01/15/2021 CLINICAL DATA:  51 year old male with sudden severe headache. Hypertension. Severe kidney disease. Age indeterminate small vessel disease on head CT. EXAM: MRI HEAD WITHOUT CONTRAST TECHNIQUE: Multiplanar, multiecho pulse sequences of the brain and surrounding structures were obtained without intravenous contrast. COMPARISON:  Head CT 01/14/2021, 08/06/2016. FINDINGS: Brain: Small 7 mm focus of restricted diffusion in the right corona radiata near the right lateral ventricle and body of the corpus callosum (series 2, image 34 and series 3, image 17). Multifocal hemosiderin related susceptibility artifact elsewhere on DWI. No other convincing restricted diffusion. However, a small area of microhemorrhage in the medial left periatrial white matter appears to be acute or subacute (series 4, image 17 and series 7, image 51) and is conspicuous on DWI. But there is no mass effect, and no convincing hyperdense hemorrhage here on the recent CT. Fairly extensive underlying chronic microhemorrhages especially in the cerebellum and brainstem. And superimposed extensive signal heterogeneity in the bilateral deep gray matter nuclei, pons, and deep cerebellar nuclei in keeping with multiple chronic lacunar infarcts. Additional confluent, widespread abnormal bilateral cerebral white matter T2 and FLAIR hyperintense signal. Additionally, there is confluent abnormal T2 and FLAIR hyperintensity in the left cerebellar hemisphere (series 4, images 6 and 10), with perhaps subtle similar signal abnormality on the right (such  as the SCA territory on series 4, image 12). And similar subtle cortical and white matter signal changes in the bilateral occipital poles (series 4, image 17) and perhaps also asymmetrically affecting the parietal lobes (image 23). No superimposed midline shift, mass effect, evidence of mass lesion, ventriculomegaly. Cervicomedullary junction and pituitary are within normal limits. Vascular: Major intracranial vascular flow voids are preserved. The right vertebral artery is dominant and mildly dolichoectatic. There is generalized intracranial artery tortuosity, ectasia. Skull and upper cervical spine: Negative visible cervical spine. Visualized bone marrow signal is within normal limits. Sinuses/Orbits: Negative. Paranasal sinuses and mastoids are stable and well aerated. Other: Negative visible internal auditory structures appear normal. Negative visible scalp and face. IMPRESSION: 1. Constellation of very severe chronic small vessel disease with diffuse intracranial arterial ectasia, and numerous chronic micro-hemorrhages and chronic lacunar infarcts. But superimposed: - posterior circulation (left cerebellar predominant) signal abnormality suspicious for mild Posterior Reversible Encephalopathy Syndrome (PRES), - small Acute White Matter Lacunar infarct in the posterior right corona radiata, - and a Subacute Micro-hemorrhage in the left periatrial white matter. Neurology consultation may be valuable. 2. No intracranial mass effect. Electronically Signed   By: Genevie Ann M.D.   On: 01/15/2021 08:55   US Abdomen Complete  Result Date: 01/15/2021 CLINICAL DATA:  Renal failure, evaluate abdominal aortic aneurysm EXAM: ABDOMEN ULTRASOUND COMPLETE COMPARISON:  CT abdomen pelvis, 12/04/2019 FINDINGS: Gallbladder: No gallstones  or wall thickening visualized. No sonographic Murphy sign noted by sonographer. Common bile duct: Diameter: 0.5 cm Liver: Focal fatty deposition of the anterior left lobe of the liver, as seen on  prior CT examination. Within normal limits in parenchymal echogenicity. Portal vein is patent on color Doppler imaging with normal direction of blood flow towards the liver. IVC: No abnormality visualized. Pancreas: Visualized portion unremarkable. Spleen: Size and appearance within normal limits. Right Kidney: Length: 9.6 cm. Increased cortical echogenicity. No mass or hydronephrosis visualized. Left Kidney: Length: 9.9 cm. Increased cortical echogenicity. No mass or hydronephrosis visualized. 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 Other findings: None. IMPRESSION: 1. 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 Recommend CT angiogram to further evaluate. 2. Increased renal cortical echogenicity, consistent with medical renal disease. No hydronephrosis. Electronically Signed   By: Delanna Ahmadi M.D.   On: 01/15/2021 13:48   DG CHEST PORT 1 VIEW  Result Date: 01/15/2021 CLINICAL DATA:  Hypertensive emergency EXAM: PORTABLE CHEST 1 VIEW COMPARISON:  None. FINDINGS: Possible enlargement of the ascending thoracic aorta. Top-normal heart size. Left perihilar atelectasis/scarring. No pleural effusion. The visualized skeletal structures are unremarkable. IMPRESSION: Left perihilar atelectasis/scarring. Possible enlargement of the ascending thoracic aorta. Electronically Signed   By: Macy Mis M.D.   On: 01/15/2021 12:40   ECHOCARDIOGRAM COMPLETE  Result Date: 01/15/2021    ECHOCARDIOGRAM REPORT   Patient Name:   David [name] Date of Exam: 01/15/2021 Medical Rec #:  [id number]  Height:       66.3 in Accession #:    [id number] Weight:       234.5 lb Date of Birth:  Jan 03, 1971  BSA:          2.146 m Patient Age:    51 years   BP:           188/137 mmHg Patient Gender: M          HR:           77 bpm. Exam Location:  Inpatient Procedure: 2D Echo Indications:    Stroke  History:        Patient  has prior history of Echocardiogram examinations, most                 recent 02/27/2018. Risk Factors:Hypertension.  Sonographer:    Jefferey Pica Referring Phys: [id number] [place]  1. LVH with speckled appearance with abnormal strain apical sparing suggests amyloid consider cardiac MRI or Tc scan to further evaluate.  2. Left ventricular ejection fraction, by estimation, is 60 to 65%. The left ventricle has normal function. The left ventricle has no regional wall motion abnormalities. The left ventricular internal cavity size was mildly dilated. There is moderate left ventricular hypertrophy. Left ventricular diastolic parameters are consistent with Grade I diastolic dysfunction (impaired relaxation).  3. Right ventricular systolic function is normal. The right ventricular size is normal.  4. Left atrial size was moderately dilated.  5. The mitral valve is normal in structure. No evidence of mitral valve regurgitation. No evidence of mitral stenosis.  6. The aortic valve was not well visualized. There is mild calcification of the aortic valve. Aortic valve regurgitation is not visualized. Aortic valve sclerosis is present, with no evidence of aortic valve stenosis.  7. Aortic dilatation noted. There is moderate dilatation of the ascending aorta, measuring 43 mm.  8. The inferior vena  cava is normal in size with greater than 50% respiratory variability, suggesting right atrial pressure of 3 mmHg. FINDINGS  Left Ventricle: Left ventricular ejection fraction, by estimation, is 60 to 65%. The left ventricle has normal function. The left ventricle has no regional wall motion abnormalities. The left ventricular internal cavity size was mildly dilated. There is  moderate left ventricular hypertrophy. Left ventricular diastolic parameters are consistent with Grade I diastolic dysfunction (impaired relaxation). Right Ventricle: The right ventricular size is normal. No increase in right ventricular wall  thickness. Right ventricular systolic function is normal. Left Atrium: Left atrial size was moderately dilated. Right Atrium: Right atrial size was normal in size. Pericardium: There is no evidence of pericardial effusion. Mitral Valve: The mitral valve is normal in structure. No evidence of mitral valve regurgitation. No evidence of mitral valve stenosis. Tricuspid Valve: The tricuspid valve is normal in structure. Tricuspid valve regurgitation is not demonstrated. No evidence of tricuspid stenosis. Aortic Valve: The aortic valve was not well visualized. There is mild calcification of the aortic valve. Aortic valve regurgitation is not visualized. Aortic valve sclerosis is present, with no evidence of aortic valve stenosis. Aortic valve peak gradient measures 11.0 mmHg. Pulmonic Valve: The pulmonic valve was normal in structure. Pulmonic valve regurgitation is not visualized. No evidence of pulmonic stenosis. Aorta: Aortic dilatation noted. There is moderate dilatation of the ascending aorta, measuring 43 mm. Venous: The inferior vena cava is normal in size with greater than 50% respiratory variability, suggesting right atrial pressure of 3 mmHg. IAS/Shunts: No atrial level shunt detected by color flow Doppler. Additional Comments: LVH with speckled appearance with abnormal strain apical sparing suggests amyloid consider cardiac MRI or Tc scan to further evaluate.  LEFT VENTRICLE PLAX 2D LVIDd:         4.40 cm Diastology LVIDs:         2.80 cm LV e' lateral:   4.28 cm/s LV PW:         1.60 cm LV E/e' lateral: 10.3 LV IVS:        1.60 cm  IVC IVC diam: 1.90 cm LEFT ATRIUM             Index        RIGHT ATRIUM           Index LA diam:        4.40 cm 2.05 cm/m   RA Area:     13.30 cm LA Vol (A2C):   61.4 ml 28.61 ml/m  RA Volume:   27.10 ml  12.63 ml/m LA Vol (A4C):   48.7 ml 22.69 ml/m LA Biplane Vol: 55.9 ml 26.05 ml/m  AORTIC VALVE              PULMONIC VALVE AV Vmax:      165.50 cm/s PV Vmax:       1.38 m/s  AV Peak Grad: 11.0 mmHg   PV Peak grad:  7.6 mmHg LVOT Vmax:    175.00 cm/s LVOT Vmean:   97.200 cm/s LVOT VTI:     0.286 m  AORTA Ao Root diam: 4.30 cm Ao Asc diam:  4.30 cm MITRAL VALVE MV Area (PHT): 3.87 cm     SHUNTS MV Decel Time: 196 msec     Systemic VTI: 0.29 m MV E velocity: 44.00 cm/s MV A velocity: 112.00 cm/s MV E/A ratio:  0.39 Jenkins Rouge MD Electronically signed by Jenkins Rouge MD Signature Date/Time: 01/15/2021/5:24:59 PM    Final  PHYSICAL EXAM  Temp:  [97.6 F (36.4 C)-98.9 F (37.2 C)] 98.9 F (37.2 C) (01/07 1600) Pulse Rate:  [55-92] 74 (01/07 0245) Resp:  [5-21] 13 (01/07 1830) BP: (124-198)/(89-149) 150/102 (01/07 1830) SpO2:  [95 %-99 %] 98 % (01/07 0400)  General - Well nourished, well developed, in no apparent distress.  Ophthalmologic - fundi not visualized due to noncooperation.  Cardiovascular - Regular rhythm and rate.  Mental Status -  Level of arousal and orientation to time, place, and person were intact. Language including expression, naming, repetition, comprehension was assessed and found intact. Attention span and concentration were normal. Fund of Knowledge was assessed and was intact.  Cranial Nerves II - XII - II - Visual field intact OU. III, IV, VI - Extraocular movements intact. V - Facial sensation intact bilaterally. VII - Facial movement intact bilaterally. VIII - Hearing & vestibular intact bilaterally. X - Palate elevates symmetrically. XI - Chin turning & shoulder shrug intact bilaterally. XII - Tongue protrusion intact.  Motor Strength - The patients strength was normal in all extremities and pronator drift was absent.  Bulk was normal and fasciculations were absent.   Motor Tone - Muscle tone was assessed at the neck and appendages and was normal.  Reflexes - The patients reflexes were symmetrical in all extremities and he had no pathological reflexes.  Sensory - Light touch, temperature/pinprick were assessed and were  symmetrical.    Coordination - The patient had normal movements in the hands and feet with no ataxia or dysmetria.  Tremor was absent.  Gait and Station - deferred.    ASSESSMENT/PLAN [name] is a 51 y.o. male with history HTN, AAA, and CKD who presented with worsening frontal headache for several days, systolic blood pressure of 249/150 (Cardene drip), change in taste, increased size of AAA with intramural thrombus (vascular consult) and creatinine elevated to 6.0 (baseline 3-4). MRI brain showed small acute right lacunar infarct, left parietal subacute microhemorrhage, numerous chronic microhemorrhages, numerous lacunar infarcts, and findings consistent with PRES. He did not receive IV t-PA.   PRES Uncontrolled hypertension on presentation MRI showed posterior circulation (left cerebellar predominant) signal abnormality suspicious for mild Posterior Reversible Encephalopathy Syndrome (PRES) Gradually normalize BP in 2 to 3 days Okay to lower BP less than 160 today Aggressive BP management as outpatient  Stroke: small acute right lacunar infarct, left parietal subacute microhemorrhage, numerous chronic microhemorrhages, numerous lacunar infarcts CT head - Age indeterminate lacunar infarcts within the thalami bilaterally. MRI head - Constellation of very severe chronic small vessel disease with diffuse intracranial arterial ectasia, and numerous chronic micro-hemorrhages and chronic lacunar infarcts. Small Acute White Matter Lacunar infarct in the posterior right corona radiata, and a Subacute Micro-hemorrhage in the left periatrial white matter. MRA head - No emergent large vessel occlusion. Severe stenosis versus short segment occlusion of the proximal left M2 segment. Carotid Doppler - pending 2D Echo - EF 60 - 65%. Aortic dilatation noted. There is moderate dilatation of the ascending aorta, measuring 43 mm. [employer] Virus 2 - negative (positive 1 year ago) LDL - 253 HgbA1c -  5.5 UDS - negative VTE prophylaxis - SCDs aspirin 81 mg daily (not listed in home meds) prior to admission, now on ASA 81. No DAPT due to uncontrolled HTN and numerous microhemorrhages.  Patient will be counseled to be compliant with his antithrombotic medications Ongoing aggressive stroke risk factor management Therapy recommendations:  pending Disposition:  Pending  AAA Abdominal US - Aneurysm of  the infrarenal abdominal aorta measuring up to 6.1 cm, with echogenic mural thrombus.  VVS on board Recommend CT angiogram once AKI improves to further evaluate.   AKI on CKD IV Abdominal US - increased renal cortical echogenicity, consistent with medical renal disease. No hydronephrosis. Creatinine 6.25->6.03  Hypertensive emergency Home BP meds: hydralazine; Coreg ; Norvasc ; Catapres Current BP meds: carvedilol Now on Coreg, amlodipine and clonidine BP somewhat high, still on Cleviprex SBP goal per Neuro <160 today Gradually normalize BP in 2 to 3 days Long-term BP goal normotensive  Hyperlipidemia Home Lipid lowering medication: none  LDL 253, goal < 70 Current lipid lowering medication: Lipitor 80 Continue statin at discharge  Tobacco abuse Current smoker, half pack per day Smoking cessation counseling provided Pt is willing to quit  Other Stroke Risk Factors Obesity, recommend weight loss, diet and exercise as appropriate   Other Active Problems, Findings, Recommendations and/or Plan Code status - Full code  Hospital day # 1  This patient is critically ill due to hypertensive emergency, PR ES, stroke, microhemorrhages, AKI, AAA and at significant risk of neurological worsening, death form recurrent stroke, hemorrhagic transformation, ICH, hypertensive encephalopathy, renal failure, aneurysm rupture. This patient's care requires constant monitoring of vital signs, hemodynamics, respiratory and cardiac monitoring, review of multiple databases, neurological assessment,  discussion with family, other specialists and medical decision making of high complexity. I spent 40 minutes of neurocritical care time in the care of this patient.  I discussed with Dr. Kimber Relic CCM.  Rosalin Hawking, MD PhD Stroke Neurology 01/16/2021 7:43 PM   To contact Stroke Continuity provider, please refer to [email or web address] After hours, contact General Neurology

## 2021-01-17 ENCOUNTER — Encounter (HOSPITAL_COMMUNITY): Payer: BC Managed Care – PPO

## 2021-01-17 ENCOUNTER — Inpatient Hospital Stay (HOSPITAL_COMMUNITY): Payer: BC Managed Care – PPO

## 2021-01-17 DIAGNOSIS — I10 Essential (primary) hypertension: Secondary | ICD-10-CM | POA: Diagnosis not present

## 2021-01-17 DIAGNOSIS — N184 Chronic kidney disease, stage 4 (severe): Secondary | ICD-10-CM | POA: Diagnosis not present

## 2021-01-17 DIAGNOSIS — I6330 Cerebral infarction due to thrombosis of unspecified cerebral artery: Secondary | ICD-10-CM

## 2021-01-17 DIAGNOSIS — I161 Hypertensive emergency: Secondary | ICD-10-CM | POA: Diagnosis not present

## 2021-01-17 LAB — BASIC METABOLIC PANEL
Anion gap: 10 (ref 5–15)
BUN: 41 mg/dL — ABNORMAL HIGH (ref 6–20)
CO2: 23 mmol/L (ref 22–32)
Calcium: 9 mg/dL (ref 8.9–10.3)
Chloride: 101 mmol/L (ref 98–111)
Creatinine, Ser: 6.53 mg/dL — ABNORMAL HIGH (ref 0.61–1.24)
GFR, Estimated: 10 mL/min — ABNORMAL LOW (ref >60)
Glucose, Bld: 104 mg/dL — ABNORMAL HIGH (ref 70–99)
Potassium: 3.8 mmol/L (ref 3.5–5.1)
Sodium: 134 mmol/L — ABNORMAL LOW (ref 135–145)

## 2021-01-17 LAB — PHOSPHORUS: Phosphorus: 5.2 mg/dL — ABNORMAL HIGH (ref 2.5–4.6)

## 2021-01-17 LAB — CBC
HCT: 40 % (ref 39.0–52.0)
Hemoglobin: 12.8 g/dL — ABNORMAL LOW (ref 13.0–17.0)
MCH: 26.4 pg (ref 26.0–34.0)
MCHC: 32 g/dL (ref 30.0–36.0)
MCV: 82.6 fL (ref 80.0–100.0)
Platelets: 230 10*3/uL (ref 150–400)
RBC: 4.84 MIL/uL (ref 4.22–5.81)
RDW: 15.8 % — ABNORMAL HIGH (ref 11.5–15.5)
WBC: 3.9 10*3/uL — ABNORMAL LOW (ref 4.0–10.5)
nRBC: 0 % (ref 0.0–0.2)

## 2021-01-17 LAB — MAGNESIUM: Magnesium: 2.5 mg/dL — ABNORMAL HIGH (ref 1.7–2.4)

## 2021-01-17 MED ORDER — LABETALOL HCL 5 MG/ML IV SOLN: 10.0000 mg | INTRAVENOUS | Status: DC | PRN

## 2021-01-17 MED ORDER — HYDRALAZINE HCL 20 MG/ML IJ SOLN
10.0000 mg | INTRAMUSCULAR | Status: DC | PRN
  Filled 2021-01-17 (×2): qty 1

## 2021-01-17 MED ORDER — CLONIDINE HCL 0.2 MG PO TABS
0.2000 mg | ORAL_TABLET | Freq: Three times a day (TID) | ORAL | Status: DC
  Administered 2021-01-17 – 2021-01-18 (×3): 0.2 mg via ORAL
  Filled 2021-01-17 (×3): qty 1

## 2021-01-17 MED ORDER — CLONIDINE HCL 0.2 MG PO TABS
0.2000 mg | ORAL_TABLET | Freq: Two times a day (BID) | ORAL | Status: DC
  Administered 2021-01-17: 0.2 mg via ORAL
  Filled 2021-01-17: qty 1

## 2021-01-17 NOTE — Progress Notes (Signed)
Home medication clarification  Prescribed clonidine 0.3 mg PO tid but was not taking PTA Hydralazine 100 mg PO tid - was taking although caused dizziness and ran out 2 weeks PTA Amlodipine 10 mg PO daily Carvedilol 25 mg PO qpm - although should've been taking bid  Discussed with Claiborne Billings, RN who was looking at his bottles  Renold Genta, PharmD, BCPS Clinical Pharmacist Clinical phone for 01/17/2021 until 3p is [phone] 01/17/2021 12:51 PM  **Pharmacist phone directory can be found on [email or web address] listed under [place]**

## 2021-01-17 NOTE — Progress Notes (Signed)
STROKE TEAM PROGRESS NOTE   INTERVAL HISTORY No family is at the bedside.  Patient lying in bed, initially sleeping, however easily arousable, follow commands and neuro intact.  BP 140s, off Cleviprex.  Nephrology on board for AKI on CKD.   OBJECTIVE Vitals:   01/17/21 1600 01/17/21 1630 01/17/21 1700 01/17/21 1730  BP: (!) 155/120 (!) 146/113 (!) 147/110 (!) 140/112  Pulse: (!) 55 (!) 59 (!) 57 (!) 56  Resp: (!) 7 10 17 11   Temp: (!) 97.4 F (36.3 C)     TempSrc: Oral     SpO2: 90% 93% 95% 94%  Weight:        CBC:  Recent Labs  Lab 01/14/21 2208 01/16/21 0438 01/17/21 0658  WBC 4.5 4.8 3.9*  NEUTROABS 2.2  --   --   HGB 13.3 13.0 12.8*  HCT 41.8 39.6 40.0  MCV 84.8 82.7 82.6  PLT 167 158 956    Basic Metabolic Panel:  Recent Labs  Lab 01/16/21 0438 01/17/21 0658  NA 133* 134*  K 3.9 3.8  CL 100 101  CO2 25 23  GLUCOSE 117* 104*  BUN 38* 41*  CREATININE 6.03* 6.53*  CALCIUM 9.0 9.0  MG 2.3 2.5*  PHOS  --  5.2*    Lipid Panel:     Component Value Date/Time   CHOL 351 (H) 01/15/2021 2010   CHOL 189 12/24/2019 1345   TRIG 195 (H) 01/15/2021 2010   HDL 59 01/15/2021 2010   HDL 38 (L) 12/24/2019 1345   CHOLHDL 5.9 01/15/2021 2010   VLDL 39 01/15/2021 2010   LDLCALC 253 (H) 01/15/2021 2010   LDLCALC 135 (H) 12/24/2019 1345   HgbA1c:  Lab Results  Component Value Date   HGBA1C 5.5 01/15/2021   Urine Drug Screen:     Component Value Date/Time   LABOPIA NONE DETECTED 01/15/2021 1147   COCAINSCRNUR NONE DETECTED 01/15/2021 1147   LABBENZ NONE DETECTED 01/15/2021 1147   AMPHETMU NONE DETECTED 01/15/2021 1147   THCU NONE DETECTED 01/15/2021 1147   LABBARB NONE DETECTED 01/15/2021 1147    Alcohol Level No results found for: [facility]  IMAGING   CT CHEST WO CONTRAST  Result Date: 01/17/2021 CLINICAL DATA:  Abdominal x-ray, adenopathy. EXAM: CT CHEST WITHOUT CONTRAST TECHNIQUE: Multidetector CT imaging of the chest was performed following the standard  protocol without IV contrast. COMPARISON:  Chest radiograph dated January 15, 2021 FINDINGS: Cardiovascular: The heart is normal in size. No pericardial effusion. Coronary artery atherosclerotic calcifications. Main pulmonary trunk is dilated measuring up to 3.3 cm concerning for pulmonary arterial hypertension. Ascending aorta is ectatic measuring up to 4.1 cm. Atherosclerotic calcification of the aortic arch. Descending thoracic aorta is normal in caliber. Mediastinum/Nodes: No enlarged mediastinal or axillary lymph nodes. Thyroid gland, trachea, and esophagus demonstrate no significant findings. Lungs/Pleura: Mild centrilobular and paraseptal emphysematous changes of bilateral upper lobe. Bibasilar subsegmental linear atelectasis. A small thin-walled cyst in the right lower lobe. No pleural effusion or pneumothorax. Upper Abdomen: No acute abnormality. Musculoskeletal: No chest wall mass or suspicious bone lesions identified. IMPRESSION: 1. Ectatic ascending aorta measuring up to 4.1 cm. 2. Main pulmonary trunk is dilated measuring up to 3.3 cm concerning for pulmonary arterial hypertension. 3. Mild emphysematous changes of upper lobes without acute pulmonary process. 4. Coronary artery atherosclerotic calcifications. Electronically Signed   By: Keane Police D.O.   On: 01/17/2021 12:07   MR ANGIO HEAD WO CONTRAST  Result Date: 01/16/2021 CLINICAL DATA:  Acute neurologic  deficit EXAM: MRA HEAD WITHOUT CONTRAST TECHNIQUE: Angiographic images of the Circle of Willis were acquired using MRA technique without intravenous contrast. COMPARISON:  No pertinent prior exam. FINDINGS: POSTERIOR CIRCULATION: --Vertebral arteries: Mild ectasia of the right V4 segment. Normal left. --Inferior cerebellar arteries: Normal. --Basilar artery: Normal. --Superior cerebellar arteries: Normal. --Posterior cerebral arteries: Normal. ANTERIOR CIRCULATION: --Intracranial internal carotid arteries: Normal. --Anterior cerebral arteries  (ACA): Normal. --Middle cerebral arteries (MCA): Dilated appearance of the right M1 segment. Severe stenosis versus short segment occlusion of the proximal left M2 segment. The more distal left M2 segment is normal. ANATOMIC VARIANTS: None IMPRESSION: 1. No emergent large vessel occlusion. 2. Severe stenosis versus short segment occlusion of the proximal left M2 segment. Electronically Signed   By: Ulyses Jarred M.D.   On: 01/16/2021 03:43     PHYSICAL EXAM  Temp:  [97.4 F (36.3 C)-98.6 F (37 C)] 97.4 F (36.3 C) (01/08 1600) Pulse Rate:  [55-69] 56 (01/08 1730) Resp:  [5-28] 11 (01/08 1730) BP: (115-169)/(69-151) 140/112 (01/08 1730) SpO2:  [90 %-95 %] 94 % (01/08 1730) Weight:  [95 kg] 95 kg (01/08 0500)  General - Well nourished, well developed, in no apparent distress.  Ophthalmologic - fundi not visualized due to noncooperation.  Cardiovascular - Regular rhythm and rate.  Mental Status -  Level of arousal and orientation to time, place, and person were intact. Language including expression, naming, repetition, comprehension was assessed and found intact. Attention span and concentration were normal. Fund of Knowledge was assessed and was intact.  Cranial Nerves II - XII - II - Visual field intact OU. III, IV, VI - Extraocular movements intact. V - Facial sensation intact bilaterally. VII - Facial movement intact bilaterally. VIII - Hearing & vestibular intact bilaterally. X - Palate elevates symmetrically. XI - Chin turning & shoulder shrug intact bilaterally. XII - Tongue protrusion intact.  Motor Strength - The patients strength was normal in all extremities and pronator drift was absent.  Bulk was normal and fasciculations were absent.   Motor Tone - Muscle tone was assessed at the neck and appendages and was normal.  Reflexes - The patients reflexes were symmetrical in all extremities and he had no pathological reflexes.  Sensory - Light touch,  temperature/pinprick were assessed and were symmetrical.    Coordination - The patient had normal movements in the hands and feet with no ataxia or dysmetria.  Tremor was absent.  Gait and Station - deferred.    ASSESSMENT/PLAN Mr. Deane Wattenbarger is a 51 y.o. male with history HTN, AAA, and CKD who presented with worsening frontal headache for several days, systolic blood pressure of 249/150 (Cardene drip), change in taste, increased size of AAA with intramural thrombus (vascular consult) and creatinine elevated to 6.0 (baseline 3-4). MRI brain showed small acute right lacunar infarct, left parietal subacute microhemorrhage, numerous chronic microhemorrhages, numerous lacunar infarcts, and findings consistent with PRES. He did not receive IV t-PA.   PRES Uncontrolled hypertension on presentation MRI showed posterior circulation (left cerebellar predominant) signal abnormality suspicious for mild Posterior Reversible Encephalopathy Syndrome (PRES) Gradually normalize BP in 2 to 3 days Okay to lower BP less than 160 today Aggressive BP management as outpatient  Stroke: small acute right lacunar infarct, left parietal subacute microhemorrhage, numerous chronic microhemorrhages, numerous lacunar infarcts CT head - Age indeterminate lacunar infarcts within the thalami bilaterally. MRI head - Constellation of very severe chronic small vessel disease with diffuse intracranial arterial ectasia, and numerous chronic micro-hemorrhages and chronic  lacunar infarcts. Small Acute White Matter Lacunar infarct in the posterior right corona radiata, and a Subacute Micro-hemorrhage in the left periatrial white matter. MRA head - No emergent large vessel occlusion. Severe stenosis versus short segment occlusion of the proximal left M2 segment. Carotid Doppler - pending 2D Echo - EF 60 - 65%. Aortic dilatation noted. There is moderate dilatation of the ascending aorta, measuring 43 mm. [employer] Virus 2 - negative  (positive 1 year ago) LDL - 253 HgbA1c - 5.5 UDS - negative VTE prophylaxis - SCDs aspirin 81 mg daily (not listed in home meds) prior to admission, now on ASA 81. No DAPT due to uncontrolled HTN and numerous microhemorrhages.  Patient will be counseled to be compliant with his antithrombotic medications Ongoing aggressive stroke risk factor management Therapy recommendations: None Disposition:  Pending  AAA Abdominal US - Aneurysm of the infrarenal abdominal aorta measuring up to 6.1 cm, with echogenic mural thrombus.  VVS on board Recommend CT angiogram once AKI improves to further evaluate.   AKI on CKD IV Abdominal US - increased renal cortical echogenicity, consistent with medical renal disease. No hydronephrosis. Creatinine 6.25->6.03-> 6.53 Nephrology on board  Hypertensive emergency Home BP meds: hydralazine; Coreg ; Norvasc ; Catapres Stable now Now on Coreg, amlodipine and clonidine Off Cleviprex SBP goal per Neuro <160 today Gradually normalize BP in 2 to 3 days Long-term BP goal normotensive  Hyperlipidemia Home Lipid lowering medication: none  LDL 253, goal < 70 Current lipid lowering medication: Lipitor 80 Continue statin at discharge  Tobacco abuse Current smoker, half pack per day Smoking cessation counseling provided Pt is willing to quit  Other Stroke Risk Factors Obesity, recommend weight loss, diet and exercise as appropriate   Other Active Problems, Findings, Recommendations and/or Plan Code status - Full code  Hospital day # 2  Neurology will sign off. Please call with questions. Pt will follow up with stroke clinic NP at [facility] in about 4 weeks. Thanks for the consult.   Rosalin Hawking, MD PhD Stroke Neurology 01/17/2021 6:31 PM   To contact Stroke Continuity provider, please refer to [email or web address] After hours, contact General Neurology

## 2021-01-17 NOTE — Progress Notes (Signed)
Discussed with MD Praveen Mannam about pt's BP >160. Unable to give PRN labetalol due to HR in the 50s. New orders placed by MD Mannam.

## 2021-01-17 NOTE — Progress Notes (Incomplete)
STUDENT FOLLOW-UP NOTES - DISREGARD       CC/HPI: Presents with worsening frontal headache with SBP > 200  PMH: CKD IV, HTN, abdominal aortic aneurysm  PLAN:  F/u cleviprex deescelation, TG F/u BP - hydralazine causing anxiety/dizziness; spironolactone?; further clonidine escalation not advised per nephro F/u echo, CXR F/u UOP, lytes   Anticoag: Plts 230; Hgb 12.8; SCDs; no DAPT d/t uncontrolled HTN and microhemorrhages  ID:  Antimicrobials this admission: *** *** >> *** *** *** >> ***  Dose adjustments this admission: ***  Microbiology results: *** BCx: *** *** UCx: ***  *** Sputum: ***  *** MRSA PCR: ***  CV: SBP < 160; EF 60-65%; LDL 253; TG 195; coreg, amlodipine, clonidine, cleviprex; h/o AAA, no AC per vascular -- needs CTA w/ contrast, but concern for pushing into renal failure Endocrine: A1c 5.5 GI/Nutrition: LBM 1/5; miralax, colace,  Neuro: PRES; R lacunar infarct, L parietal subacute micro hemorrhage;  Renal: Scr 6.03 >> 6.53 (baseline 3-4), Na 134, Phos 5.2, Mg 2.5 Pulm: SpO2 low-mid 90s on room air Heme/Onc:  PTA Med Issues: Hydralazine 100mg  TID per pt Best Practices:

## 2021-01-17 NOTE — Progress Notes (Addendum)
NAME:  David [name], MRN:  [id number], DOB:  August 24, 1970, LOS: 2 ADMISSION DATE:  01/14/2021, CONSULTATION DATE:  01/17/21 REFERRING MD:  Dina Rich, CHIEF COMPLAINT:  Headache, HTN   History of Present Illness:  David [name] is a 51 y.o. M with PMH significant for CKD IV, poorly controlled HTN, 3cm AAA who noted a worsening frontal headache for the last several days and SBP >200 at home despite taking his anti-hypertensive medications.  Denies illegal drug ise and UDS negative.   In the ED BP was as high as 249/150.  Pt has noted some change in taste, but no vision changes, chest pain, dyspnea, nausea or vomiting.  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  His son massaged this and it improved.    Pt was started on Cardene gtt, labs were significant for acute on chronic renal failure with creatinine of 6 above baseline 3-4, EKG without ischemic changes, MRI brain with numerous chronic lacunar infarcts and micro-hemorrhages with small acute R lacunar infarct and L parietal subacute micro-hemorrhage along with likely PRES.  Pt was started on Cardene gtt.  At the time of exam, BP still >200/100, though patient now denies pain and is alert and oriented.   Pertinent  Medical History   has a past medical history of AAA (abdominal aortic aneurysm) (11/2019), Hypertension, Proteinuria (12/2019), and Vitamin D deficiency. CKD   Significant Hospital Events: Including procedures, antibiotic start and stop dates in addition to other pertinent events   1/6 Admit to PCCM for hypertensive emergency, start on Cleviprex, vascular surgery consulted 1/8 remains on Cleviprex drip  Interim History / Subjective:     Objective   Blood pressure (!) 156/108, pulse 74, temperature 98.6 F (37 C), temperature source Oral, resp. rate 17, weight 95 kg, SpO2 98 %.        Intake/Output Summary (Last 24 hours) at 01/17/2021 0723 Last data filed at 01/17/2021  0700 Gross per 24 hour  Intake 238.43 ml  Output 425 ml  Net -186.57 ml   Blood pressure (!) 189/109, pulse 74, temperature 98.1 F (36.7 C), temperature source Oral, resp. rate 11, SpO2 98 %. Gen:      No acute distress HEENT:  EOMI, sclera anicteric Neck:     No masses; no thyromegaly Lungs:    Clear to auscultation bilaterally; normal respiratory effort CV:         Regular rate and rhythm; no murmurs Abd:      + bowel sounds; soft, non-tender; no palpable masses, no distension Ext:    No edema; adequate peripheral perfusion Skin:      Warm and dry; no rash Neuro: alert and oriented x 3 Psych: normal mood and affect   Labs/imaging reviewed Labs are pending  Resolved Hospital Problem list     Assessment & Plan:  Hypertensive Emergency with acute R lacunar CVA and numerous chronic lacunar infarcts and microhemorrhages  Pt reports compliance with home Norvasc, coreg and clonidine, reports stopped Lasix and hydralazine as it made him dizzy.  Per last outpatient note, BP has been difficult to control Weaning off Cleviprex drip. Restarted Coreg, Norvasc and clonidine.  Increase clonidine dose today Is unable to take hydralazine as it causes dizziness Labetalol as needed  History of AAA Abdominal ultrasound noted with infrarenal 6.1 cm abdominal aneurysm Discussed with vascular surgery who will follow.  We will get CT with contrast when creatinine is improved The  thrombus is he does have a mural thrombus with no need for anticoagulation at present  Diastolic heart failure Echo reviewed with possibility of amyloid Will need cardiac MRI as an outpatient  Active smoker Chest x-ray shows some right hilar prominence.  Get CT chest  Acute on Chronic Renal Failure  Baseline CKD stage IV -creatinine bump from baseline ~4 to 6.25 likely from hypertensive emergency Follow urine output and creatinine Holding Lasix Will consult nephrology to help with management of kidney disease and  hard to control hypertension   Best Practice (right click and "Reselect all SmartList Selections" daily)   Diet/type: Regular consistency (see orders) DVT prophylaxis: SCD GI prophylaxis: N/A Lines: N/A Foley:  N/A Code Status:  full code Last date of multidisciplinary goals of care discussion:   Critical care time:   The patient is critically ill with multiple organ system failure and requires high complexity decision making for assessment and support, frequent evaluation and titration of therapies, advanced monitoring, review of radiographic studies and interpretation of complex data.   Critical Care Time devoted to patient care services, exclusive of separately billable procedures, described in this note is 35 minutes.   Marshell Garfinkel MD [facility] See Amion for pager  If no response to pager , please call [phone] until 7pm After 7:00 pm call Elink  [phone] 01/17/2021, 8:42 AM

## 2021-01-17 NOTE — Care Management (Signed)
°  Transition of Care (TOC) Screening Note   Patient Details  Name: Moxon Messler Date of Birth: [birth year]   Transition of [facility] CM/SW Contact:    Bethena Roys, RN Phone Number: 01/17/2021, 4:17 PM    Transition of Care Department [facility]) has reviewed patient and no TOC needs have been identified at this time. We will continue to monitor patient advancement through interdisciplinary progression rounds. If new patient transition needs arise, please place a TOC consult.

## 2021-01-17 NOTE — Plan of Care (Signed)
Problem: Education: Goal: Knowledge of General Education information will improve Description: Including pain rating scale, medication(s)/side effects and non-pharmacologic comfort measures Outcome: Progressing   Problem: Health Behavior/Discharge Planning: Goal: Ability to manage health-related needs will improve Outcome: Progressing   Problem: Clinical Measurements: Goal: Ability to maintain clinical measurements within normal limits will improve Outcome: Progressing Goal: Will remain free from infection Outcome: Progressing Goal: Diagnostic test results will improve Outcome: Progressing Goal: Respiratory complications will improve Outcome: Progressing Goal: Cardiovascular complication will be avoided Outcome: Progressing   Problem: Safety: Goal: Ability to remain free from injury will improve Outcome: Progressing   Problem: Education: Goal: Knowledge of disease or condition will improve Outcome: Progressing Goal: Knowledge of secondary prevention will improve (SELECT ALL) Outcome: Progressing Goal: Knowledge of patient specific risk factors will improve (INDIVIDUALIZE FOR PATIENT) Outcome: Progressing   Problem: Activity: Goal: Risk for activity intolerance will decrease Outcome: Completed/Met   Problem: Nutrition: Goal: Adequate nutrition will be maintained Outcome: Completed/Met   Problem: Coping: Goal: Level of anxiety will decrease Outcome: Completed/Met   Problem: Elimination: Goal: Will not experience complications related to urinary retention Outcome: Completed/Met   Problem: Pain Managment: Goal: General experience of comfort will improve Outcome: Completed/Met   Problem: Coping: Goal: Will verbalize positive feelings about self Outcome: Completed/Met Goal: Will identify appropriate support needs Outcome: Completed/Met

## 2021-01-17 NOTE — Consult Note (Addendum)
Reason for Consult: Renal failure Referring Physician: Dr. 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 His creatinine in 2020 when he was seen by Dr. Joelyn Oms was 2.8 with very well controlled blood pressures on Norvasc, Carvedilol, Clonidine and hydralazine. For some unknown reason he was referred by his primary Kathe Becton to a nephrologist in [place]. Patient is not sure what changes were made but he states that he has not been taking the hydralazine which was now up to 100mg  TID. No e/o renal artery stenosis or pheo.  - Continue regulated control of BP in ranges as you are doing to maintain adequate perfusion to vital organs. He is at goal with 30% drop initially and slow titration of the Cleviprex with addition of oral medications.  - Would increase the Clonidine at 0.2mg  TID -> would try to avoid further escalation if possible. But he was on 0.3mg  TID 2 years ago and appears that he has been off Clonidine now as well as off Spironolactone. We really need outside records from his nephrologist in [place] to get an accurate list of his anti-hypertensive regimen + trending of his renal function as he may have progressed. He is feeling better with the Clonidine than with hydralazine during this hospitalization; hydralazine was making him feel sick.  - He's off hydralazine at this time because of anxiety and he felt as if someone was coming to stab him in the back. If there is difficulty titrating off on the Cleviprex I would at least restart at Hydralazine 25mg  TID and monitor for symptoms. - Once renal function stabilizes could add spironlactone as well; but I wonder if this was stopped bec of Hyperkalemia. He was on this 2 years  ago.  -Monitor Daily I/Os, Daily weight  -Avoid nephrotoxic medications including NSAIDs - Dose meds for GFR <15 for now.  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 Depending on possible progression of renal disease he may need a permanent access placed. According to the patient dialysis has never been discussed.    HPI: David [name] is an 51 y.o. male poorly controlled HTn, AAA, CKDIV with a baseline Cr of 3-4 presenting with worsening frontal headache for a few days and SBP>200 at home despite anti-hypertensive medications; he is on norvasc, coreg, hydralazine and clonidine. He did stop the hydralazine because of dizziness. He denies and diplopia or visual changes, chest pain, shortness of breath, nausea or vomiting.In the ED BP was noted to be as high as 249/150.  MRI showed many chronic lacunar infarcts and microhemorrhages, possible PRES. Patient was initially started on a Cardene gtt and then transitioned to Cleviprex gtt.  Patient treated also with coreg, hydralazine and clonidine.  Patient did also have right flank pain which has resolved with imaging revealing growth from 5.3cm in 2022 to now 6.1cm.    ROS Pertinent items are noted in HPI.  Chemistry and CBC: Creat  Date/Time Value Ref Range Status  07/28/2020 03:29 PM 3.83 (H) 0.70 - 1.30 mg/dL Final  03/10/2020 12:00 AM 4.04 (H) 0.70 - 1.33 mg/dL Final    Comment:    For patients >40 years of age, the reference limit for  Creatinine is approximately 13% higher for people identified as African-American. Marland Kitchen   02/18/2020 12:00 AM 3.84 (H) 0.70 - 1.33 mg/dL Final    Comment:    For patients >65 years of age, the reference limit for Creatinine is approximately 13% higher for people identified as African-American. Marland Kitchen   08/15/2016 01:32 PM 1.38 (H) 0.60 - 1.35 mg/dL Final   Creatinine,  Ser  Date/Time Value Ref Range Status  01/17/2021 06:58 AM 6.53 (H) 0.61 - 1.24 mg/dL Final  01/16/2021 04:38 AM 6.03 (H) 0.61 - 1.24 mg/dL Final  01/14/2021 10:08 PM 6.25 (H) 0.61 - 1.24 mg/dL Final  06/25/2020 02:11 PM 3.40 (H) 0.76 - 1.27 mg/dL Final  12/24/2019 01:45 PM 5.30 (H) 0.76 - 1.27 mg/dL Final  12/04/2019 01:30 PM 4.64 (H) 0.61 - 1.24 mg/dL Final  03/07/2018 10:36 AM 4.30 (H) 0.76 - 1.27 mg/dL Final  03/02/2018 03:56 AM 4.62 (H) 0.61 - 1.24 mg/dL Final  03/01/2018 04:54 AM 4.38 (H) 0.61 - 1.24 mg/dL Final  02/28/2018 11:34 AM 3.83 (H) 0.61 - 1.24 mg/dL Final  02/27/2018 03:19 AM 3.77 (H) 0.61 - 1.24 mg/dL Final  02/26/2018 05:15 PM 4.06 (H) 0.61 - 1.24 mg/dL Final  08/06/2016 11:35 AM 1.58 (H) 0.61 - 1.24 mg/dL Final   Recent Labs  Lab 01/14/21 2208 01/16/21 0438 01/17/21 0658  NA 136 133* 134*  K 3.7 3.9 3.8  CL 102 100 101  CO2 25 25 23   GLUCOSE 92 117* 104*  BUN 35* 38* 41*  CREATININE 6.25* 6.03* 6.53*  CALCIUM 9.0 9.0 9.0  PHOS  --   --  5.2*   Recent Labs  Lab 01/14/21 2208 01/16/21 0438 01/17/21 0658  WBC 4.5 4.8 3.9*  NEUTROABS 2.2  --   --   HGB 13.3 13.0 12.8*  HCT 41.8 39.6 40.0  MCV 84.8 82.7 82.6  PLT 167 158 230   Liver Function Tests: Recent Labs  Lab 01/14/21 2208  AST 15  ALT 11  ALKPHOS 95  BILITOT 0.8  PROT 7.1  ALBUMIN 3.8   No results for input(s): LIPASE, AMYLASE in the last 168 hours. No results for input(s): AMMONIA in the last 168 hours. Cardiac Enzymes: No results for input(s): CKTOTAL, CKMB, CKMBINDEX, TROPONINI in the last 168 hours. Iron Studies: No results for input(s): IRON, TIBC, TRANSFERRIN, FERRITIN in the last 72 hours. PT/INR: @LABRCNTIP (inr:5)  Xrays/Other Studies: ) Results for orders placed or performed during the hospital encounter of 01/14/21 (from the past 48 hour(s))  Urine rapid drug screen (hosp performed)     Status: None   Collection Time: 01/15/21 11:47 AM  Result Value Ref Range   Opiates  NONE DETECTED NONE DETECTED   Cocaine NONE DETECTED NONE DETECTED   Benzodiazepines NONE DETECTED NONE DETECTED   Amphetamines NONE DETECTED NONE DETECTED   Tetrahydrocannabinol NONE DETECTED NONE DETECTED   Barbiturates NONE DETECTED NONE DETECTED    Comment: (NOTE) DRUG SCREEN FOR MEDICAL PURPOSES ONLY.  IF CONFIRMATION IS NEEDED FOR ANY PURPOSE, NOTIFY LAB WITHIN 5 DAYS.  LOWEST DETECTABLE LIMITS FOR URINE DRUG SCREEN Drug Class                     Cutoff (ng/mL) Amphetamine and metabolites    1000 Barbiturate and metabolites    200 Benzodiazepine                 270 Tricyclics and metabolites     300 Opiates and metabolites  300 Cocaine and metabolites        300 THC                            50 Performed at [facility], [place] [address], [place], [place] [address]   Urinalysis, Routine w reflex microscopic Urine, Clean Catch     Status: Abnormal   Collection Time: 01/15/21 11:47 AM  Result Value Ref Range   Color, Urine YELLOW YELLOW   APPearance CLEAR CLEAR   Specific Gravity, Urine 1.025 1.005 - 1.030   pH 6.0 5.0 - 8.0   Glucose, UA NEGATIVE NEGATIVE mg/dL   Hgb urine dipstick SMALL (A) NEGATIVE   Bilirubin Urine NEGATIVE NEGATIVE   Ketones, ur NEGATIVE NEGATIVE mg/dL   Protein, ur >300 (A) NEGATIVE mg/dL   Nitrite NEGATIVE NEGATIVE   Leukocytes,Ua NEGATIVE NEGATIVE    Comment: Performed at [facility] [address] [place], [place] [address]  Urinalysis, Microscopic (reflex)     Status: Abnormal   Collection Time: 01/15/21 11:47 AM  Result Value Ref Range   RBC / HPF 6-10 0 - 5 RBC/hpf   WBC, UA 0-5 0 - 5 WBC/hpf   Bacteria, UA FEW (A) NONE SEEN   Squamous Epithelial / LPF 0-5 0 - 5   Mucus PRESENT     Comment: Performed at [facility], [place] [address], [place], [place] [address]  MRSA Next Gen by PCR, Nasal     Status: None   Collection Time: 01/15/21  3:12 PM   Specimen: Nasal Mucosa; Nasal Swab  Result Value Ref Range   MRSA  by PCR Next Gen NOT DETECTED NOT DETECTED    Comment: (NOTE) The GeneXpert MRSA Assay (FDA approved for NASAL specimens only), is one component of a comprehensive MRSA colonization surveillance program. It is not intended to diagnose MRSA infection nor to guide or monitor treatment for MRSA infections. Test performance is not FDA approved in patients less than 31 years old. Performed at [facility], [place] [address], [place], [place] [address]   HIV Antibody (routine testing w rflx)     Status: None   Collection Time: 01/15/21  4:03 PM  Result Value Ref Range   HIV Screen 4th Generation wRfx Non Reactive Non Reactive    Comment: Performed at [facility], [place] [address], [place], [place] [address]  Troponin I (High Sensitivity)     Status: Abnormal   Collection Time: 01/15/21  4:03 PM  Result Value Ref Range   Troponin I (High Sensitivity) 154 (HH) <18 ng/L    Comment: CRITICAL RESULT CALLED TO, READ BACK BY AND VERIFIED WITH: Trish Mage RN [id number] 01/15/2021 BY R VERAAR (NOTE) Elevated high sensitivity troponin I (hsTnI) values and significant  changes across serial measurements may suggest ACS but many other  chronic and acute conditions are known to elevate hsTnI results.  Refer to the Links section for chest pain algorithms and additional  guidance. Performed at [facility], [place] [address] [place], [place] [address]   Brain natriuretic peptide     Status: Abnormal   Collection Time: 01/15/21  4:03 PM  Result Value Ref Range   B Natriuretic Peptide 298.4 (H) 0.0 - 100.0 pg/mL    Comment: Performed at [facility] [address] [place],  [address]  LDL cholesterol, direct     Status: Abnormal  Collection Time: 01/15/21  8:10 PM  Result Value Ref Range   Direct LDL 257.5 (H) 0 - 99 mg/dL    Comment: Performed at [facility] [address] [place], [place] [address]  Hemoglobin A1c     Status: None   Collection Time: 01/15/21  8:10 PM   Result Value Ref Range   Hgb A1c MFr Bld 5.5 4.8 - 5.6 %    Comment: (NOTE) Pre diabetes:          5.7%-6.4%  Diabetes:              >6.4%  Glycemic control for   <7.0% adults with diabetes    Mean Plasma Glucose 111.15 mg/dL    Comment: Performed at [facility] [address] [place], [place] [address]  Lipid panel     Status: Abnormal   Collection Time: 01/15/21  8:10 PM  Result Value Ref Range   Cholesterol 351 (H) 0 - 200 mg/dL   Triglycerides 195 (H) <150 mg/dL   HDL 59 >40 mg/dL   Total CHOL/HDL Ratio 5.9 RATIO   VLDL 39 0 - 40 mg/dL   LDL Cholesterol 253 (H) 0 - 99 mg/dL    Comment:        Total Cholesterol/HDL:CHD Risk Coronary Heart Disease Risk Table                     Men   Women  1/2 Average Risk   3.4   3.3  Average Risk       5.0   4.4  2 X Average Risk   9.6   7.1  3 X Average Risk  23.4   11.0        Use the calculated Patient Ratio above and the CHD Risk Table to determine the patient's CHD Risk.        ATP III CLASSIFICATION (LDL):  <100     mg/dL   Optimal  100-129  mg/dL   Near or Above                    Optimal  130-159  mg/dL   Borderline  160-189  mg/dL   High  >190     mg/dL   Very High Performed at [facility] [address], [place], [place] [address]   Basic metabolic panel     Status: Abnormal   Collection Time: 01/16/21  4:38 AM  Result Value Ref Range   Sodium 133 (L) 135 - 145 mmol/L   Potassium 3.9 3.5 - 5.1 mmol/L   Chloride 100 98 - 111 mmol/L   CO2 25 22 - 32 mmol/L   Glucose, Bld 117 (H) 70 - 99 mg/dL    Comment: Glucose reference range applies only to samples taken after fasting for at least 8 hours.   BUN 38 (H) 6 - 20 mg/dL   Creatinine, Ser 6.03 (H) 0.61 - 1.24 mg/dL   Calcium 9.0 8.9 - 10.3 mg/dL   GFR, Estimated 11 (L) >60 mL/min    Comment: (NOTE) Calculated using the CKD-EPI Creatinine Equation (2021)    Anion gap 8 5 - 15    Comment: Performed at [facility] [address]  [place], [place] [address]  CBC     Status: Abnormal   Collection Time: 01/16/21  4:38 AM  Result Value Ref Range   WBC 4.8 4.0 - 10.5 K/uL   RBC  4.79 4.22 - 5.81 MIL/uL   Hemoglobin 13.0 13.0 - 17.0 g/dL   HCT 39.6 39.0 - 52.0 %   MCV 82.7 80.0 - 100.0 fL   MCH 27.1 26.0 - 34.0 pg   MCHC 32.8 30.0 - 36.0 g/dL   RDW 15.7 (H) 11.5 - 15.5 %   Platelets 158 150 - 400 K/uL   nRBC 0.0 0.0 - 0.2 %    Comment: Performed at [facility] [address], [place], [place] [address]  Magnesium     Status: None   Collection Time: 01/16/21  4:38 AM  Result Value Ref Range   Magnesium 2.3 1.7 - 2.4 mg/dL    Comment: Performed at [facility] [address] [place], [place] [address]  CBC     Status: Abnormal   Collection Time: 01/17/21  6:58 AM  Result Value Ref Range   WBC 3.9 (L) 4.0 - 10.5 K/uL   RBC 4.84 4.22 - 5.81 MIL/uL   Hemoglobin 12.8 (L) 13.0 - 17.0 g/dL   HCT 40.0 39.0 - 52.0 %   MCV 82.6 80.0 - 100.0 fL   MCH 26.4 26.0 - 34.0 pg   MCHC 32.0 30.0 - 36.0 g/dL   RDW 15.8 (H) 11.5 - 15.5 %   Platelets 230 150 - 400 K/uL   nRBC 0.0 0.0 - 0.2 %    Comment: Performed at [facility], [place] [address], [place], [place] [address]  Basic metabolic panel     Status: Abnormal   Collection Time: 01/17/21  6:58 AM  Result Value Ref Range   Sodium 134 (L) 135 - 145 mmol/L   Potassium 3.8 3.5 - 5.1 mmol/L   Chloride 101 98 - 111 mmol/L   CO2 23 22 - 32 mmol/L   Glucose, Bld 104 (H) 70 - 99 mg/dL    Comment: Glucose reference range applies only to samples taken after fasting for at least 8 hours.   BUN 41 (H) 6 - 20 mg/dL   Creatinine, Ser 6.53 (H) 0.61 - 1.24 mg/dL   Calcium 9.0 8.9 - 10.3 mg/dL   GFR, Estimated 10 (L) >60 mL/min    Comment: (NOTE) Calculated using the CKD-EPI Creatinine Equation (2021)    Anion gap 10 5 - 15    Comment: Performed at [facility] [address], [place], [place] [address]  Magnesium     Status: Abnormal   Collection Time:  01/17/21  6:58 AM  Result Value Ref Range   Magnesium 2.5 (H) 1.7 - 2.4 mg/dL    Comment: Performed at [facility] [address], [place], [place] [address]  Phosphorus     Status: Abnormal   Collection Time: 01/17/21  6:58 AM  Result Value Ref Range   Phosphorus 5.2 (H) 2.5 - 4.6 mg/dL    Comment: Performed at [facility] [address], [place], [place] [address]   MR ANGIO HEAD WO CONTRAST  Result Date: 01/16/2021 CLINICAL DATA:  Acute neurologic deficit EXAM: MRA HEAD WITHOUT CONTRAST TECHNIQUE: Angiographic images of the Circle of Willis were acquired using MRA technique without intravenous contrast. COMPARISON:  No pertinent prior exam. FINDINGS: POSTERIOR CIRCULATION: --Vertebral arteries: Mild ectasia of the right V4 segment. Normal left. --Inferior cerebellar arteries: Normal. --Basilar artery: Normal. --Superior cerebellar arteries: Normal. --Posterior cerebral arteries: Normal. ANTERIOR CIRCULATION: --Intracranial internal carotid arteries: Normal. --Anterior cerebral arteries (ACA): Normal. --Middle cerebral arteries (MCA): Dilated appearance of the  right M1 segment. Severe stenosis versus short segment occlusion of the proximal left M2 segment. The more distal left M2 segment is normal. ANATOMIC VARIANTS: None IMPRESSION: 1. No emergent large vessel occlusion. 2. Severe stenosis versus short segment occlusion of the proximal left M2 segment. Electronically Signed   By: Ulyses Jarred M.D.   On: 01/16/2021 03:43   US Abdomen Complete  Result Date: 01/15/2021 CLINICAL DATA:  Renal failure, evaluate abdominal aortic aneurysm EXAM: ABDOMEN ULTRASOUND COMPLETE COMPARISON:  CT abdomen pelvis, 12/04/2019 FINDINGS: Gallbladder: No gallstones or wall thickening visualized. No sonographic Murphy sign noted by sonographer. Common bile duct: Diameter: 0.5 cm Liver: Focal fatty deposition of the anterior left lobe of the liver, as seen on prior CT examination. Within normal limits in  parenchymal echogenicity. Portal vein is patent on color Doppler imaging with normal direction of blood flow towards the liver. IVC: No abnormality visualized. Pancreas: Visualized portion unremarkable. Spleen: Size and appearance within normal limits. Right Kidney: Length: 9.6 cm. Increased cortical echogenicity. No mass or hydronephrosis visualized. Left Kidney: Length: 9.9 cm. Increased cortical echogenicity. No mass or hydronephrosis visualized. 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 Other findings: None. IMPRESSION: 1. 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 Recommend CT angiogram to further evaluate. 2. Increased renal cortical echogenicity, consistent with medical renal disease. No hydronephrosis. Electronically Signed   By: Delanna Ahmadi M.D.   On: 01/15/2021 13:48   DG CHEST PORT 1 VIEW  Result Date: 01/15/2021 CLINICAL DATA:  Hypertensive emergency EXAM: PORTABLE CHEST 1 VIEW COMPARISON:  None. FINDINGS: Possible enlargement of the ascending thoracic aorta. Top-normal heart size. Left perihilar atelectasis/scarring. No pleural effusion. The visualized skeletal structures are unremarkable. IMPRESSION: Left perihilar atelectasis/scarring. Possible enlargement of the ascending thoracic aorta. Electronically Signed   By: Macy Mis M.D.   On: 01/15/2021 12:40   ECHOCARDIOGRAM COMPLETE  Result Date: 01/15/2021    ECHOCARDIOGRAM REPORT   Patient Name:   ANGEL WEEDON Date of Exam: 01/15/2021 Medical Rec #:  [id number]  Height:       66.3 in Accession #:    [id number] Weight:       234.5 lb Date of Birth:  07-10-1970  BSA:          2.146 m Patient Age:    30 years   BP:           188/137 mmHg Patient Gender: M          HR:           77 bpm. Exam Location:  Inpatient Procedure: 2D Echo Indications:    Stroke  History:        Patient has prior history of Echocardiogram  examinations, most                 recent 02/27/2018. Risk Factors:Hypertension.  Sonographer:    Jefferey Pica Referring Phys: [id number] [place]  1. LVH with speckled appearance with abnormal strain apical sparing suggests amyloid consider cardiac MRI or Tc scan to further evaluate.  2. Left ventricular ejection fraction, by estimation, is 60 to 65%. The left ventricle has normal function. The left ventricle has no regional wall motion abnormalities. The left ventricular internal cavity size was mildly dilated. There is moderate left ventricular hypertrophy. Left ventricular diastolic parameters are consistent with Grade I diastolic dysfunction (impaired relaxation).  3. Right ventricular systolic function  is normal. The right ventricular size is normal.  4. Left atrial size was moderately dilated.  5. The mitral valve is normal in structure. No evidence of mitral valve regurgitation. No evidence of mitral stenosis.  6. The aortic valve was not well visualized. There is mild calcification of the aortic valve. Aortic valve regurgitation is not visualized. Aortic valve sclerosis is present, with no evidence of aortic valve stenosis.  7. Aortic dilatation noted. There is moderate dilatation of the ascending aorta, measuring 43 mm.  8. The inferior vena cava is normal in size with greater than 50% respiratory variability, suggesting right atrial pressure of 3 mmHg. FINDINGS  Left Ventricle: Left ventricular ejection fraction, by estimation, is 60 to 65%. The left ventricle has normal function. The left ventricle has no regional wall motion abnormalities. The left ventricular internal cavity size was mildly dilated. There is  moderate left ventricular hypertrophy. Left ventricular diastolic parameters are consistent with Grade I diastolic dysfunction (impaired relaxation). Right Ventricle: The right ventricular size is normal. No increase in right ventricular wall thickness. Right ventricular  systolic function is normal. Left Atrium: Left atrial size was moderately dilated. Right Atrium: Right atrial size was normal in size. Pericardium: There is no evidence of pericardial effusion. Mitral Valve: The mitral valve is normal in structure. No evidence of mitral valve regurgitation. No evidence of mitral valve stenosis. Tricuspid Valve: The tricuspid valve is normal in structure. Tricuspid valve regurgitation is not demonstrated. No evidence of tricuspid stenosis. Aortic Valve: The aortic valve was not well visualized. There is mild calcification of the aortic valve. Aortic valve regurgitation is not visualized. Aortic valve sclerosis is present, with no evidence of aortic valve stenosis. Aortic valve peak gradient measures 11.0 mmHg. Pulmonic Valve: The pulmonic valve was normal in structure. Pulmonic valve regurgitation is not visualized. No evidence of pulmonic stenosis. Aorta: Aortic dilatation noted. There is moderate dilatation of the ascending aorta, measuring 43 mm. Venous: The inferior vena cava is normal in size with greater than 50% respiratory variability, suggesting right atrial pressure of 3 mmHg. IAS/Shunts: No atrial level shunt detected by color flow Doppler. Additional Comments: LVH with speckled appearance with abnormal strain apical sparing suggests amyloid consider cardiac MRI or Tc scan to further evaluate.  LEFT VENTRICLE PLAX 2D LVIDd:         4.40 cm Diastology LVIDs:         2.80 cm LV e' lateral:   4.28 cm/s LV PW:         1.60 cm LV E/e' lateral: 10.3 LV IVS:        1.60 cm  IVC IVC diam: 1.90 cm LEFT ATRIUM             Index        RIGHT ATRIUM           Index LA diam:        4.40 cm 2.05 cm/m   RA Area:     13.30 cm LA Vol (A2C):   61.4 ml 28.61 ml/m  RA Volume:   27.10 ml  12.63 ml/m LA Vol (A4C):   48.7 ml 22.69 ml/m LA Biplane Vol: 55.9 ml 26.05 ml/m  AORTIC VALVE              PULMONIC VALVE AV Vmax:      165.50 cm/s PV Vmax:       1.38 m/s AV Peak Grad: 11.0 mmHg   PV  Peak grad:  7.6 mmHg LVOT  Vmax:    175.00 cm/s LVOT Vmean:   97.200 cm/s LVOT VTI:     0.286 m  AORTA Ao Root diam: 4.30 cm Ao Asc diam:  4.30 cm MITRAL VALVE MV Area (PHT): 3.87 cm     SHUNTS MV Decel Time: 196 msec     Systemic VTI: 0.29 m MV E velocity: 44.00 cm/s MV A velocity: 112.00 cm/s MV E/A ratio:  0.39 Jenkins Rouge MD Electronically signed by Jenkins Rouge MD Signature Date/Time: 01/15/2021/5:24:59 PM    Final     PMH:   Past Medical History:  Diagnosis Date   AAA (abdominal aortic aneurysm) 11/2019   Hypertension    Proteinuria 12/2019   Vitamin D deficiency     PSH:   Past Surgical History:  Procedure Laterality Date   none      Allergies:  Allergies  Allergen Reactions   Hydralazine     Dizziness, abnormal sensation, nauseous    Medications:   Prior to Admission medications   Medication Sig Start Date End Date Taking? Authorizing Provider  amLODipine (NORVASC) 10 MG tablet TAKE 1 TABLET (10 MG TOTAL) BY MOUTH DAILY. Patient taking differently: Take 10 mg by mouth daily. 12/24/19 01/15/21 Yes Azzie Glatter, FNP  carvedilol (COREG) 25 MG tablet TAKE 1 TABLET (25 MG TOTAL) BY MOUTH 2 (TWO) TIMES DAILY. Patient taking differently: Take 25 mg by mouth at bedtime. 12/24/19 01/15/21 Yes Azzie Glatter, FNP  hydrALAZINE (APRESOLINE) 100 MG tablet TAKE 1 TABLET (100 MG TOTAL) BY MOUTH EVERY 8 (EIGHT) HOURS. Patient taking differently: Take 100 mg by mouth 3 (three) times daily. 09/11/20 09/11/21 Yes Vevelyn Francois, NP  furosemide (LASIX) 40 MG tablet Take 1 tablet (40 mg total) by mouth daily. Patient not taking: Reported on 01/15/2021 07/28/20   Dana Allan I, MD  Iron, Ferrous Sulfate, 325 (65 Fe) MG TABS Take 325 mg by mouth in the morning and at bedtime. Patient not taking: Reported on 01/15/2021 07/28/20   Dana Allan I, MD  buPROPion [facility] SR) 150 MG 12 hr tablet Take 1 tablet (150 mg total) by mouth 2 (two) times daily. 12/24/19 02/05/20  Azzie Glatter,  FNP  spironolactone (ALDACTONE) 25 MG tablet Take 1 tablet (25 mg total) by mouth daily. Patient not taking: No sig reported 12/24/19 03/10/20  Azzie Glatter, FNP    Discontinued Meds:   Medications Discontinued During This Encounter  Medication Reason   nicardipine (CARDENE) 20mg  in 0.86% saline 234ml IV infusion (0.1 mg/ml)    amLODipine (NORVASC) tablet 10 mg    hydrALAZINE (APRESOLINE) tablet 100 mg    carvedilol (COREG) tablet 25 mg    cloNIDine (CATAPRES) tablet 0.1 mg     Social History:  reports that he has quit smoking. He has never used smokeless tobacco. He reports that he does not drink alcohol and does not use drugs.  Family History:   Family History  Family history unknown: Yes    Blood pressure (!) 156/108, pulse 74, temperature 98.2 F (36.8 C), temperature source Oral, resp. rate 17, weight 95 kg, SpO2 98 %. Gen: A&O x4, NAD HEENT: NCAT Neck: Supple, trachea midline, no JVD. Resp: CTA B/L no rales CV: RRR, no m/g/r, no lower ext edema Abd: S/NT/ND; no rebound Extrem: No no cyanosis, clubbing, or edema. GU: no foley       Dwana Melena, MD 01/17/2021, 8:57 AM

## 2021-01-18 ENCOUNTER — Inpatient Hospital Stay (HOSPITAL_COMMUNITY): Payer: BC Managed Care – PPO

## 2021-01-18 ENCOUNTER — Other Ambulatory Visit: Payer: Self-pay

## 2021-01-18 DIAGNOSIS — I161 Hypertensive emergency: Secondary | ICD-10-CM | POA: Diagnosis not present

## 2021-01-18 DIAGNOSIS — I639 Cerebral infarction, unspecified: Secondary | ICD-10-CM | POA: Diagnosis not present

## 2021-01-18 LAB — BASIC METABOLIC PANEL
Anion gap: 11 (ref 5–15)
BUN: 45 mg/dL — ABNORMAL HIGH (ref 6–20)
CO2: 27 mmol/L (ref 22–32)
Calcium: 8.8 mg/dL — ABNORMAL LOW (ref 8.9–10.3)
Chloride: 97 mmol/L — ABNORMAL LOW (ref 98–111)
Creatinine, Ser: 6.4 mg/dL — ABNORMAL HIGH (ref 0.61–1.24)
GFR, Estimated: 10 mL/min — ABNORMAL LOW (ref >60)
Glucose, Bld: 91 mg/dL (ref 70–99)
Potassium: 3.9 mmol/L (ref 3.5–5.1)
Sodium: 135 mmol/L (ref 135–145)

## 2021-01-18 LAB — MAGNESIUM: Magnesium: 2.6 mg/dL — ABNORMAL HIGH (ref 1.7–2.4)

## 2021-01-18 LAB — PHOSPHORUS: Phosphorus: 5.2 mg/dL — ABNORMAL HIGH (ref 2.5–4.6)

## 2021-01-18 LAB — CBC
HCT: 36.1 % — ABNORMAL LOW (ref 39.0–52.0)
Hemoglobin: 11.5 g/dL — ABNORMAL LOW (ref 13.0–17.0)
MCH: 26.5 pg (ref 26.0–34.0)
MCHC: 31.9 g/dL (ref 30.0–36.0)
MCV: 83.2 fL (ref 80.0–100.0)
Platelets: 207 10*3/uL (ref 150–400)
RBC: 4.34 MIL/uL (ref 4.22–5.81)
RDW: 15.8 % — ABNORMAL HIGH (ref 11.5–15.5)
WBC: 4.3 10*3/uL (ref 4.0–10.5)
nRBC: 0 % (ref 0.0–0.2)

## 2021-01-18 MED ORDER — HYDROCHLOROTHIAZIDE 25 MG PO TABS
25.0000 mg | ORAL_TABLET | Freq: Every day | ORAL | 2 refills | Status: DC
  Filled 2021-01-18: qty 30, 30d supply, fill #0

## 2021-01-18 MED ORDER — ASPIRIN 81 MG PO TBEC
81.0000 mg | DELAYED_RELEASE_TABLET | Freq: Every day | ORAL | 11 refills | Status: AC
Start: 2021-01-18 — End: ?
  Filled 2021-01-18: qty 30, 30d supply, fill #0

## 2021-01-18 MED ORDER — ATORVASTATIN CALCIUM 80 MG PO TABS
80.0000 mg | ORAL_TABLET | Freq: Every day | ORAL | 2 refills | Status: AC
Start: 2021-01-18 — End: ?
  Filled 2021-01-18: qty 30, 30d supply, fill #0
  Filled 2021-02-22: qty 30, 30d supply, fill #1

## 2021-01-18 MED ORDER — HYDROCHLOROTHIAZIDE 25 MG PO TABS
25.0000 mg | ORAL_TABLET | Freq: Every day | ORAL | Status: DC
  Administered 2021-01-18: 25 mg via ORAL
  Filled 2021-01-18: qty 1

## 2021-01-18 NOTE — Discharge Summary (Signed)
Physician Discharge Summary  Patient ID: David [name] MRN: [id number] DOB/AGE: April 11, 1970 51 y.o.  Admit date: 01/14/2021 Discharge date: 01/18/2021  Admission Diagnoses: hypertensive emergency.   Discharge Diagnoses:  Active Problems:   Hypertensive emergency   Cerebral thrombosis with cerebral infarction   Cerebral embolism with cerebral infarction   Subarachnoid hemorrhage   Intracerebral hemorrhage   Discharged Condition: good  Hospital Course: 51 year old man who presented with worsening frontal headache and was found to have a SBP >200 at home despite compliance with prescribed anti-hypertensive therapy. Initially seen as a code stroke but no focal deficits and CT head showed only chronic lacunar infarcts consistent with hypertensive disease. Blood pressure was initially managed with clevidipine IV and patient was transitioned to home oral medication which provides control to a systolic 258 -527. He remained asymptomatic.  He is discharge home and appropriate follow-up has been arranged locally.   Consults: pulmonary/intensive care and nephrology  Significant Diagnostic Studies: labs: Creatinine now stable in 6.0 range with normal electrolytes, radiology: Ultrasound: AAA now 6cm with mural thrombus , and cardiac graphics: Echocardiogram: severe LVH.  Treatments: cardiac meds: nicardipine  Discharge Exam: Blood pressure (!) 152/115, pulse (!) 50, temperature 97.8 F (36.6 C), temperature source Oral, resp. rate 12, weight 95 kg, SpO2 94 %. General appearance: alert, cooperative, and mildly obese Throat: lips, mucosa, and tongue normal; teeth and gums normal Resp: clear to auscultation bilaterally Cardio: S1: normal, S2: normal, and S4 present GI: soft, non-tender; bowel sounds normal; no masses,  no organomegaly Extremities: no edema, redness or tenderness in the calves or thighs Neurologic: Alert and oriented X 3, normal strength and tone. Normal symmetric reflexes. Normal  coordination and gait  Disposition:   Discharge Instructions     Ambulatory referral to Neurology   Complete by: As directed    Follow up with stroke clinic NP (David [name] or David [name], if both not available, consider David [name], or David [name]) at [facility] in about 4 weeks. Thanks.   Ambulatory referral to Vascular Surgery   Complete by: As directed    Known AAA      Allergies as of 01/18/2021       Reactions   Hydralazine    Dizziness, abnormal sensation, nauseous        Medication List     STOP taking these medications    furosemide 40 MG tablet Commonly known as: LASIX   hydrALAZINE 100 MG tablet Commonly known as: APRESOLINE   Iron (Ferrous Sulfate) 325 (65 Fe) MG Tabs       TAKE these medications    amLODipine 10 MG tablet Commonly known as: NORVASC TAKE 1 TABLET (10 MG TOTAL) BY MOUTH DAILY. What changed: how much to take   aspirin 81 MG EC tablet Take 1 tablet (81 mg total) by mouth daily. Swallow whole.   atorvastatin 80 MG tablet Commonly known as: LIPITOR Take 1 tablet (80 mg total) by mouth daily.   carvedilol 25 MG tablet Commonly known as: COREG TAKE 1 TABLET (25 MG TOTAL) BY MOUTH 2 (TWO) TIMES DAILY. What changed:  how much to take when to take this   cloNIDine 0.3 MG tablet Commonly known as: CATAPRES Take 0.3 mg by mouth 2 (two) times daily.   hydrochlorothiazide 25 MG tablet Commonly known as: HYDRODIURIL Take 1 tablet (25 mg total) by mouth daily.        Follow-up Information     [facility]. Schedule an appointment as soon as possible  for a visit in 1 month(s).   Specialty: Neurology Why: stroke clinic Contact information: [address] [place] [id number]        [place], [place] Follow up on 02/12/2021.   Why: Dr David [name] office will see you at 11am on 02/12/21. please come 29min prior to appointment time. Contact information: [place] [address] [phone]                40 min were spent on the day of discharge.   SignedKipp [name] 01/18/2021, 8:21 AM

## 2021-01-18 NOTE — Progress Notes (Signed)
Patient discharge instructions given. All questions answered. Patient discharged via wheelchair with all personal belongings.

## 2021-01-18 NOTE — Progress Notes (Signed)
Carotid artery duplex completed. Refer to "CV Proc" under chart review to view preliminary results.  01/18/2021 11:43 AM Kelby Aline., MHA, RVT, RDCS, RDMS

## 2021-01-18 NOTE — Progress Notes (Signed)
Pt discharged prior to seeing today.  Pt with known 6.1cm AAA. Risk of rupture 10-20% / year. Currently asymptomatic, but needs CTA for anatomic evaluation for endovascular repair.  Will reach out to his nephrologist as pt will likely need [facility] / dialysis after CTA. Will see the pt in 2 weeks with plan from myself and nephrology.  Kody is aware of the signs and symptoms for rupture from our meeting Saturday and knows to call 911 should any of these occur.   Broadus John MD

## 2021-01-19 ENCOUNTER — Other Ambulatory Visit: Payer: Self-pay

## 2021-01-29 ENCOUNTER — Other Ambulatory Visit: Payer: Self-pay | Admitting: *Deleted

## 2021-01-29 ENCOUNTER — Other Ambulatory Visit: Payer: Self-pay | Admitting: Nurse Practitioner

## 2021-01-29 ENCOUNTER — Other Ambulatory Visit: Payer: Self-pay

## 2021-01-29 DIAGNOSIS — I10 Essential (primary) hypertension: Secondary | ICD-10-CM

## 2021-01-29 MED ORDER — CLONIDINE HCL 0.3 MG PO TABS
0.3000 mg | ORAL_TABLET | Freq: Two times a day (BID) | ORAL | 3 refills | Status: DC
  Filled 2021-01-29: qty 60, 30d supply, fill #0

## 2021-01-29 NOTE — Patient Outreach (Signed)
David David [name] [facility]) Care Management  01/29/2021  David [name] [name] [birth year] [id number]   RED ON EMMI ALERT - Stroke Day # 9 Date: 1/19 Red Alert Reason: Questions about medications and Has lost interest in things usually enjoy   Outreach attempt #1, successful.  Identity verified.  This care manager introduced self and stated purpose of call.  [facility] care management services explained.    Member lives with wife and family, state he has been doing well since his discharge, no lasting effects from the stroke.  He does acknowledge that he has not been able to do things he normally enjoy but this is more related his work schedule, not having time.    He is monitoring his blood pressure daily but has not been recording.  Advised to record and share trends with providers in effort to effectively manage hypertension and decrease risk of recurrent stroke.  Today's reading was 147/98.  He is completely out of Clonidine and Amlodipine and low on Carvedilol. Contacted PCP to request prescriptions be sent to pharmacy.    Follow up appointments scheduled for cardiology on 1/31, Nephrology on 2/7, Vascular on 2/7, and Neurology on 3/13.  He does not have appointment with PCP, encouraged to call to schedule.  Does not need transportation resources, will drive self.   Plan: RN CM will send education regarding stroke recovery and HTN management.  Will follow up with member within the next week.  David [name], RN, MSN, [place] Manager [phone]

## 2021-01-29 NOTE — Patient Instructions (Signed)
Visit Information  Thank you for taking time to visit with me today. Please don't hesitate to contact me if I can be of assistance to you before our next scheduled telephone appointment.  Following are the goals we discussed today:  Pick up medications from [facility].   Our next appointment is by telephone in 1 week.   The patient verbalized understanding of instructions, educational materials, and care plan provided today and agreed to receive a mailed copy of patient instructions, educational materials, and care plan.   The patient has been provided with contact information for the care management team and has been advised to call with any health related questions or concerns.   Valente David, RN, MSN, [place] Manager [phone]

## 2021-01-29 NOTE — Patient Outreach (Signed)
Received a red flag Emmi stroke notification for [name].  I have assigned David Ping, RN to call for follow up and determine if there are any Case Management needs.    [facility], [place], [place] Management [phone]

## 2021-02-02 ENCOUNTER — Telehealth: Payer: Self-pay | Admitting: *Deleted

## 2021-02-02 NOTE — Telephone Encounter (Signed)
Transition Care Management Follow-up Telephone Call Date of discharge and from where:  [facility] 01/18/21 How have you been since you were released from the hospital? "Feeling better" BP good today 138/97 Any questions or concerns? No  Items Reviewed: Did the pt receive and understand the discharge instructions provided? Yes  Medications obtained and verified? Yes  Other? Yes   pt has medications he is picking up tomorrow, they are in process of being refilled Any new allergies since your discharge? No  Dietary orders reviewed? Yes Do you have support at home? Yes   Home Care and Equipment/Supplies: Were home health services ordered? no If so, what is the name of the agency? N/a  Has the agency set up a time to come to the patient's home? not applicable Were any new equipment or medical supplies ordered?  No What is the name of the medical supply agency? N/a Were you able to get the supplies/equipment? not applicable Do you have any questions related to the use of the equipment or supplies? N/a  Functional Questionnaire: (I = Independent and D = Dependent) ADLs: I  Bathing/Dressing- I  Meal Prep- I  Eating- I  Maintaining continence- I  Transferring/Ambulation- I  Managing Meds- I  Follow up appointments reviewed:  PCP Hospital f/u appt confirmed? No  Specialist Hospital f/u appt confirmed? Yes  Scheduled to see Dr. Skeet Latch cardiology on 02/09/21 Are transportation arrangements needed? No  If their condition worsens, is the pt aware to call PCP or go to the Emergency Dept.? Yes Was the patient provided with contact information for the PCP's office or ED? Yes Was to pt encouraged to call back with questions or concerns? Yes  Jacqlyn Larsen [facility], BSN RN Case Manager [phone]

## 2021-02-03 ENCOUNTER — Other Ambulatory Visit: Payer: Self-pay | Admitting: *Deleted

## 2021-02-03 NOTE — Patient Outreach (Signed)
[facility]) Care Management  02/03/2021  David [name] July 29, 1970 [id number]   Outgoing call placed to member to follow up on stroke recovery.  State he is doing well.  Aware that medications are at the pharmacy waiting for pick up, state he will get them today.  Also reminded that he does not have appointment with PCP yet, only with cardiology and neurology, advised to call to schedule.  Verbalizes understanding.  Denies any urgent concerns, encouraged to contact this care manager with questions.  Will close case at this time as member denies no further needs.   Valente David, RN, MSN, [place] Manager [phone]

## 2021-02-04 ENCOUNTER — Other Ambulatory Visit: Payer: Self-pay

## 2021-02-05 ENCOUNTER — Other Ambulatory Visit: Payer: Self-pay

## 2021-02-09 ENCOUNTER — Ambulatory Visit (HOSPITAL_BASED_OUTPATIENT_CLINIC_OR_DEPARTMENT_OTHER): Payer: BC Managed Care – PPO | Admitting: Cardiovascular Disease

## 2021-02-09 ENCOUNTER — Other Ambulatory Visit: Payer: Self-pay

## 2021-02-09 ENCOUNTER — Encounter (HOSPITAL_BASED_OUTPATIENT_CLINIC_OR_DEPARTMENT_OTHER): Payer: Self-pay | Admitting: Cardiovascular Disease

## 2021-02-09 VITALS — BP 120/84 | HR 58 | Ht 66.0 in | Wt 206.6 lb

## 2021-02-09 DIAGNOSIS — I517 Cardiomegaly: Secondary | ICD-10-CM | POA: Diagnosis not present

## 2021-02-09 DIAGNOSIS — Z5181 Encounter for therapeutic drug level monitoring: Secondary | ICD-10-CM

## 2021-02-09 DIAGNOSIS — I10 Essential (primary) hypertension: Secondary | ICD-10-CM | POA: Diagnosis not present

## 2021-02-09 MED ORDER — CHLORTHALIDONE 25 MG PO TABS
25.0000 mg | ORAL_TABLET | Freq: Every day | ORAL | 3 refills | Status: AC
  Filled 2021-02-09: qty 90, 90d supply, fill #0

## 2021-02-09 NOTE — Assessment & Plan Note (Addendum)
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  Interestingly, his blood pressure was much better on repeat and was actually at goal.  He noted that his upper arm is borderline for using a regular size cuff.  Use a larger cuff and his blood pressure was better.  We will plan to continue the amlodipine, carvedilol, and clonidine.  Given his renal dysfunction we are limited in what medications we can try.  We will switch hydrochlorothiazide to chlorthalidone given his renal dysfunction.  Check a basic metabolic panel in a week.  He has follow-up with nephrology next month.  Could also consider adding doxazosin.  There is no evidence of adenomas on prior CT scans making pheochromocytoma and hyperaldosteronism less likely.  He had a very thoughtful work-up by Dr. Augustin Coupe while he was in the hospital earlier this month.  His echo was concerning for possible amyloidosis.  Given his renal dysfunction and hypertrophy with a speckled pattern concerning for amyloidosis, we will get a PYP scan.  Continue amlodipine, carvedilol, clonidine, and switching to chlorthalidone.

## 2021-02-09 NOTE — Progress Notes (Signed)
[facility] Initial Assessment:    Date:  02/09/2021   ID:  [name], DOB 02-26-1970, MRN [id number]  PCP:  Vevelyn Francois, NP  Cardiologist:  None  Nephrologist:  Referring MD: Vevelyn Francois, NP   CC: Hypertension  History of Present Illness:    [name] is a 51 y.o. male with a hx of abdominal aortic aneurysm, hypertension, CKD IV, here to establish care in the [facility]. He was seen by [name], NP on 04/29/2020 and his blood pressure was 157/101. It was noted that had reduced his 3x daily medication to twice daily, and he discontinued hydralazine due to side effects. At home his blood pressure was uncontrolled at 150s-160s/90s-100s. He had declined clonidine therapy due to observance of Ramadan. He was referred to cardiology for hypertensive urgency, but instructed to follow-up in 2 weeks if he had not been seen by cardiology. He followed up with [name] 05/14/20 and his blood pressure remained uncontrolled at home on 10 mg amlodipine, 25 mg carvedilol twice daily, hydralazine 100 mg twice daily, and he had been started on 80 mg furosemide daily by his nephrologist in 03/2020.  Mr. Zahler was admitted to the hospital 01/14/2021 for hypertensive emergency and stroke. He presented with SBP >200 despite compliance with his antihypertensives. 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 His blood pressure was managed with clevidipine IV and then transitioned to home oral medication which provided control at 761-607 systolic. He was asymptomatic. CT showed coronary calcification and a dilated pulmonary aorta. His ascending aorta was 4.1 cm and he had aortic atherosclerosis. Ultrasound showed a infrarenal AAA of 6.1 cm with mural thrombus, and echocardiogram revealed severe LVH and LVEF 60-65%. The myocardium was speckled and had an abnormal  strain pattern concerning for amyloid. His blood pressure was 152/115 on discharge. Furosemide, hydralazine, and iron supplements were stopped. He was discharged on 10 mg amlodipine daily, 81 mg aspirin daily, 80 mg atorvastatin daily, 25 mg carvedilol twice daily, 0.3 mg clonidine twice daily, and 25 mg HCTZ daily.  Today, he is feeling alright overall. He does not recall when exactly he was first diagnosed with hypertension, but notes it has been a long time. When he was living in [place], his blood pressure was controlled. Since moving to [facility] his blood pressure has been more labile. At home his blood pressure is usually ranging in the 140s-160s/104-110. Usually he does wait a few minutes before taking a reading. He exercises 2-3 times a week when he is not traveling for work. He may exercise for 15 minutes to an hour. Previously he suffered from shortness of breath with exercise. He reports this is no longer an issue since he quit smoking 4-5 months ago. He does not drink alcohol. For pain management he usually takes motrin or Advil. His wife cooks their meals at home, and does not add salt to his meals. When he wakes up in the morning he feels normal. He has not been told that he snores. He denies any palpitations, or chest pain. No lightheadedness, headaches, syncope, orthopnea, PND, or lower extremity edema. Within a month he states he is scheduled to visit with his nephrologist.  Previous antihypertensives: Hydralazine - anxiety  Past Medical History:  Diagnosis Date   AAA (abdominal aortic aneurysm) 11/2019   Hypertension    Proteinuria 12/2019   Vitamin  D deficiency     Past Surgical History:  Procedure Laterality Date   none      Current Medications: Current Meds  Medication Sig   amLODipine (NORVASC) 10 MG tablet TAKE 1 TABLET (10 MG TOTAL) BY MOUTH DAILY. (Patient taking differently: Take 10 mg by mouth daily.)   aspirin 81 MG EC tablet Take 1 tablet (81 mg total) by mouth  daily. Swallow whole.   atorvastatin (LIPITOR) 80 MG tablet Take 1 tablet (80 mg total) by mouth daily.   chlorthalidone (HYGROTON) 25 MG tablet Take 1 tablet (25 mg total) by mouth daily.   cloNIDine (CATAPRES) 0.3 MG tablet Take 1 tablet (0.3 mg total) by mouth 2 (two) times daily.   [DISCONTINUED] hydrochlorothiazide (HYDRODIURIL) 25 MG tablet Take 1 tablet (25 mg total) by mouth daily.     Allergies:   Hydralazine   Social History   Socioeconomic History   Marital status: Married    Spouse name: Not on file   Number of children: 5   Years of education: Not on file   Highest education level: Not on file  Occupational History   Occupation: truck driver  Tobacco Use   Smoking status: Former   Smokeless tobacco: Never  Scientific laboratory technician Use: Never used  Substance and Sexual Activity   Alcohol use: No   Drug use: No   Sexual activity: Not on file  Other Topics Concern   Not on file  Social History Narrative   Not on file   Social Determinants of Health   Financial Resource Strain: Low Risk    Difficulty of Paying Living Expenses: Not hard at all  Food Insecurity: No Food Insecurity   Worried About Charity fundraiser in the Last Year: Never true   Arboriculturist in the Last Year: Never true  Transportation Needs: No Transportation Needs   Lack of Transportation (Medical): No   Lack of Transportation (Non-Medical): No  Physical Activity: Insufficiently Active   Days of Exercise per Week: 3 days   Minutes of Exercise per Session: 30 min  Stress: Not on file  Social Connections: Not on file     Family History: The patient's family history includes Hypertension in his brother, father, and mother.  ROS:   Please see the history of present illness.    All other systems reviewed and are negative.  EKGs/Labs/Other Studies Reviewed:    Bilateral Carotid Dopplers 01/18/2021: Summary:  Right Carotid: Velocities in the right ICA are consistent with a 1-39%  stenosis.    Left Carotid: Velocities in the left ICA are consistent with a 1-39%  stenosis.   Vertebrals:  Bilateral vertebral arteries demonstrate antegrade flow.  Subclavians: Normal flow hemodynamics were seen in bilateral subclavian arteries.   CT Chest 01/17/2021: FINDINGS: Cardiovascular: The heart is normal in size. No pericardial effusion. Coronary artery atherosclerotic calcifications. Main pulmonary trunk is dilated measuring up to 3.3 cm concerning for pulmonary arterial hypertension. Ascending aorta is ectatic measuring up to 4.1 cm. Atherosclerotic calcification of the aortic arch. Descending thoracic aorta is normal in caliber.   Mediastinum/Nodes: No enlarged mediastinal or axillary lymph nodes. Thyroid gland, trachea, and esophagus demonstrate no significant findings.   Lungs/Pleura: Mild centrilobular and paraseptal emphysematous changes of bilateral upper lobe. Bibasilar subsegmental linear atelectasis. A small thin-walled cyst in the right lower lobe. No pleural effusion or pneumothorax.   Upper Abdomen: No acute abnormality.   Musculoskeletal: No chest wall mass or suspicious bone  lesions identified.   IMPRESSION: 1. Ectatic ascending aorta measuring up to 4.1 cm. 2. Main pulmonary trunk is dilated measuring up to 3.3 cm concerning for pulmonary arterial hypertension. 3. Mild emphysematous changes of upper lobes without acute pulmonary process. 4. Coronary artery atherosclerotic calcifications.  Echo 01/15/2021:  1. LVH with speckled appearance with abnormal strain apical sparing  suggests amyloid consider cardiac MRI or Tc scan to further evaluate.   2. Left ventricular ejection fraction, by estimation, is 60 to 65%. The  left ventricle has normal function. The left ventricle has no regional  wall motion abnormalities. The left ventricular internal cavity size was  mildly dilated. There is moderate  left ventricular hypertrophy. Left ventricular diastolic  parameters are  consistent with Grade I diastolic dysfunction (impaired relaxation).   3. Right ventricular systolic function is normal. The right ventricular  size is normal.   4. Left atrial size was moderately dilated.   5. The mitral valve is normal in structure. No evidence of mitral valve  regurgitation. No evidence of mitral stenosis.   6. The aortic valve was not well visualized. There is mild calcification  of the aortic valve. Aortic valve regurgitation is not visualized. Aortic  valve sclerosis is present, with no evidence of aortic valve stenosis.   7. Aortic dilatation noted. There is moderate dilatation of the ascending  aorta, measuring 43 mm.   8. The inferior vena cava is normal in size with greater than 50%  respiratory variability, suggesting right atrial pressure of 3 mmHg.  EKG:   02/09/2021: Sinus bradycardia. Rate 58 bpm. Prior inferior infarct. Lateral T wave inversions.  Recent Labs: 01/14/2021: ALT 11 01/15/2021: B Natriuretic Peptide 298.4 01/18/2021: BUN 45; Creatinine, Ser 6.40; Hemoglobin 11.5; Magnesium 2.6; Platelets 207; Potassium 3.9; Sodium 135   Recent Lipid Panel    Component Value Date/Time   CHOL 351 (H) 01/15/2021 2010   CHOL 189 12/24/2019 1345   TRIG 195 (H) 01/15/2021 2010   HDL 59 01/15/2021 2010   HDL 38 (L) 12/24/2019 1345   CHOLHDL 5.9 01/15/2021 2010   VLDL 39 01/15/2021 2010   LDLCALC 253 (H) 01/15/2021 2010   LDLCALC 135 (H) 12/24/2019 1345   LDLDIRECT 257.5 (H) 01/15/2021 2010    Physical Exam:    VS:  BP 120/84 (BP Location: Left Arm, Patient Position: Sitting, Cuff Size: Large)    Pulse (!) 58    Ht 5\' 6"  (1.676 m)    Wt 206 lb 9.6 oz (93.7 kg)    BMI 33.35 kg/m  , BMI Body mass index is 33.35 kg/m. GENERAL:  Well appearing HEENT: Pupils equal round and reactive, fundi not visualized, oral mucosa unremarkable NECK:  No jugular venous distention, waveform within normal limits, carotid upstroke brisk and symmetric, no bruits, no  thyromegaly LUNGS:  Clear to auscultation bilaterally HEART:  RRR.  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  Interestingly, his blood pressure was much better on repeat and  was actually at goal.  He noted that his upper arm is borderline for using a regular size cuff.  Use a larger cuff and his blood pressure was better.  We will plan to continue the amlodipine, carvedilol, and clonidine.  Given his renal dysfunction we are limited in what medications we can try.  We will switch hydrochlorothiazide to chlorthalidone given his renal dysfunction.  Check a basic metabolic panel in a week.  He has follow-up with nephrology next month.  Could also consider adding doxazosin.  There is no evidence of adenomas on prior CT scans making pheochromocytoma and hyperaldosteronism less likely.  He had a very thoughtful work-up by Dr. Augustin Coupe while he was in the hospital earlier this month.  His echo was concerning for possible amyloidosis.  Given his renal dysfunction and hypertrophy with a speckled pattern concerning for amyloidosis, we will get a PYP scan.  Continue amlodipine, carvedilol, clonidine, and switching to chlorthalidone.   Screening for Secondary Hypertension:  Causes 05/06/2020 02/09/2021  Drugs/Herbals - Screened     - Comments - limiting sodium intake.  No caffeine or EtOH.  Renovascular HTN - N/A     - Comments - CKD IV.  Limited utility of renal artery  Doppler.  Can discuss at follow up.  Sleep Apnea - Screened     - Comments - No symptoms.  Thyroid Disease - Screened     - Comments - Check TSH  Hyperaldosteronism (No Data) Screened     - Comments No adenomas on CT in 2021 -  Pheochromocytoma - Screened  Cushing's Syndrome - N/A  Hyperparathyroidism - Screened  Coarctation of the Aorta - Screened     - Comments - BP symmetric  Compliance - Screened    Relevant Labs/Studies: Basic Labs Latest Ref Rng & Units 01/18/2021 01/17/2021 01/16/2021  Sodium 135 - 145 mmol/L 135 134(L) 133(L)  Potassium 3.5 - 5.1 mmol/L 3.9 3.8 3.9  Creatinine 0.61 - 1.24 mg/dL 6.40(H) 6.53(H) 6.03(H)    Thyroid  Latest Ref Rng & Units 12/24/2019 02/27/2018  TSH 0.450 - 4.500 uIU/mL 1.210 3.071    Renin/Aldosterone  Latest Ref Rng & Units 02/27/2018  Aldosterone 0.0 - 30.0 ng/dL 20.9       Cortisol Latest Ref Rng & Units 02/27/2018  Cortisol  ug/dL 10.0      Disposition:    FU with Lorri Fukuhara C. Oval Linsey, MD, [facility] in 2 months.  Medication Adjustments/Labs and Tests Ordered: Current medicines are reviewed at length with the patient today.  Concerns regarding medicines are outlined above.   Orders Placed This Encounter  Procedures   Basic metabolic panel   TSH   MYOCARDIAL AMYLOID IMAGING PLANAR AND SPECT   EKG 12-Lead   Meds ordered this encounter  Medications   chlorthalidone (HYGROTON) 25 MG tablet    Sig: Take 1 tablet (25 mg total) by mouth daily.    Dispense:  90 tablet    Refill:  3    D/C HCTZ   I,Mathew Stumpf,acting as a scribe for Skeet Latch, MD.,have documented all relevant documentation on the behalf of Skeet Latch, MD,as directed by  Skeet Latch, MD while in the presence of Skeet Latch, MD.  I, Delta Oval Linsey, MD have reviewed all documentation for this visit.  The documentation of the exam, diagnosis, procedures, and orders on 02/09/2021 are all accurate and complete.   Signed, Skeet Latch, MD   02/09/2021 5:25 PM    [facility]

## 2021-02-09 NOTE — Patient Instructions (Signed)
Medication Instructions:  STOP HYDROCHLOROTHIAZIDE  START CHLORTHALIDONE 25 MG DAILY     Labwork: BMET IN 1 WEEK    Testing/Procedures: PYP SCAN    Follow-Up: 04/15/2021 2:00 PM WITH DR [facility]   Special Instructions:  MONITOR YOUR BLOOD PRESSURE TWICE A DAY, LOG IN THE BOOK PROVIDED. BRING THE BOOK AND YOUR BLOOD PRESSURE MACHINE TO YOUR FOLLOW UP IN 2 MONTHS   DASH Eating Plan DASH stands for "Dietary Approaches to Stop Hypertension." The DASH eating plan is a healthy eating plan that has been shown to reduce high blood pressure (hypertension). It may also reduce your risk for type 2 diabetes, heart disease, and stroke. The DASH eating plan may also help with weight loss. What are tips for following this plan?  General guidelines Avoid eating more than 2,300 mg (milligrams) of salt (sodium) a day. If you have hypertension, you may need to reduce your sodium intake to 1,500 mg a day. Limit alcohol intake to no more than 1 drink a day for nonpregnant women and 2 drinks a day for men. One drink equals 12 oz of beer, 5 oz of wine, or 1 oz of hard liquor. Work with your health care provider to maintain a healthy body weight or to lose weight. Ask what an ideal weight is for you. Get at least 30 minutes of exercise that causes your heart to beat faster (aerobic exercise) most days of the week. Activities may include walking, swimming, or biking. Work with your health care provider or diet and nutrition specialist (dietitian) to adjust your eating plan to your individual calorie needs. Reading food labels  Check food labels for the amount of sodium per serving. Choose foods with less than 5 percent of the Daily Value of sodium. Generally, foods with less than 300 mg of sodium per serving fit into this eating plan. To find whole grains, look for the word "whole" as the first word in the ingredient list. Shopping Buy products labeled as "low-sodium" or "no salt added." Buy fresh foods.  Avoid canned foods and premade or frozen meals. Cooking Avoid adding salt when cooking. Use salt-free seasonings or herbs instead of table salt or sea salt. Check with your health care provider or pharmacist before using salt substitutes. Do not fry foods. Cook foods using healthy methods such as baking, boiling, grilling, and broiling instead. Cook with heart-healthy oils, such as olive, canola, soybean, or sunflower oil. Meal planning Eat a balanced diet that includes: 5 or more servings of fruits and vegetables each day. At each meal, try to fill half of your plate with fruits and vegetables. Up to 6-8 servings of whole grains each day. Less than 6 oz of lean meat, poultry, or fish each day. A 3-oz serving of meat is about the same size as a deck of cards. One egg equals 1 oz. 2 servings of low-fat dairy each day. A serving of nuts, seeds, or beans 5 times each week. Heart-healthy fats. Healthy fats called Omega-3 fatty acids are found in foods such as flaxseeds and coldwater fish, like sardines, salmon, and mackerel. Limit how much you eat of the following: Canned or prepackaged foods. Food that is high in trans fat, such as fried foods. Food that is high in saturated fat, such as fatty meat. Sweets, desserts, sugary drinks, and other foods with added sugar. Full-fat dairy products. Do not salt foods before eating. Try to eat at least 2 vegetarian meals each week. Eat more home-cooked food and less restaurant,  buffet, and fast food. When eating at a restaurant, ask that your food be prepared with less salt or no salt, if possible. What foods are recommended? The items listed may not be a complete list. Talk with your dietitian about what dietary choices are best for you. Grains Whole-grain or whole-wheat bread. Whole-grain or whole-wheat pasta. Brown rice. [place]. Bulgur. Whole-grain and low-sodium cereals. Pita bread. Low-fat, low-sodium crackers. Whole-wheat flour  tortillas. Vegetables Fresh or frozen vegetables (raw, steamed, roasted, or grilled). Low-sodium or reduced-sodium tomato and vegetable juice. Low-sodium or reduced-sodium tomato sauce and tomato paste. Low-sodium or reduced-sodium canned vegetables. Fruits All fresh, dried, or frozen fruit. Canned fruit in natural juice (without added sugar). Meat and other protein foods Skinless chicken or [place]. Ground chicken or [place]. Pork with fat trimmed off. Fish and seafood. Egg whites. Dried beans, peas, or lentils. Unsalted nuts, nut butters, and seeds. Unsalted canned beans. Lean cuts of beef with fat trimmed off. Low-sodium, lean deli meat. Dairy Low-fat (1%) or fat-free (skim) milk. Fat-free, low-fat, or reduced-fat cheeses. Nonfat, low-sodium ricotta or cottage cheese. Low-fat or nonfat yogurt. Low-fat, low-sodium cheese. Fats and oils Soft margarine without trans fats. Vegetable oil. Low-fat, reduced-fat, or light mayonnaise and salad dressings (reduced-sodium). Canola, safflower, olive, soybean, and sunflower oils. Avocado. Seasoning and other foods Herbs. Spices. Seasoning mixes without salt. Unsalted popcorn and pretzels. Fat-free sweets. What foods are not recommended? The items listed may not be a complete list. Talk with your dietitian about what dietary choices are best for you. Grains Baked goods made with fat, such as croissants, muffins, or some breads. Dry pasta or rice meal packs. Vegetables Creamed or fried vegetables. Vegetables in a cheese sauce. Regular canned vegetables (not low-sodium or reduced-sodium). Regular canned tomato sauce and paste (not low-sodium or reduced-sodium). Regular tomato and vegetable juice (not low-sodium or reduced-sodium). Angie Fava. Olives. Fruits Canned fruit in a light or heavy syrup. Fried fruit. Fruit in cream or butter sauce. Meat and other protein foods Fatty cuts of meat. Ribs. Fried meat. Berniece Salines. Sausage. Bologna and other processed lunch meats.  Salami. Fatback. Hotdogs. Bratwurst. Salted nuts and seeds. Canned beans with added salt. Canned or smoked fish. Whole eggs or egg yolks. Chicken or [place] with skin. Dairy Whole or 2% milk, cream, and half-and-half. Whole or full-fat cream cheese. Whole-fat or sweetened yogurt. Full-fat cheese. Nondairy creamers. Whipped toppings. Processed cheese and cheese spreads. Fats and oils Butter. Stick margarine. Lard. Shortening. Ghee. Bacon fat. Tropical oils, such as coconut, palm kernel, or palm oil. Seasoning and other foods Salted popcorn and pretzels. Onion salt, garlic salt, seasoned salt, table salt, and sea salt. Worcestershire sauce. Tartar sauce. Barbecue sauce. Teriyaki sauce. Soy sauce, including reduced-sodium. Steak sauce. Canned and packaged gravies. Fish sauce. Oyster sauce. Cocktail sauce. Horseradish that you find on the shelf. Ketchup. Mustard. Meat flavorings and tenderizers. Bouillon cubes. Hot sauce and Tabasco sauce. Premade or packaged marinades. Premade or packaged taco seasonings. Relishes. Regular salad dressings. Where to find more information: National Heart, Lung, and [place]: https://wilson-eaton.com/ American Heart Association: www.heart.org Summary The DASH eating plan is a healthy eating plan that has been shown to reduce high blood pressure (hypertension). It may also reduce your risk for type 2 diabetes, heart disease, and stroke. With the DASH eating plan, you should limit salt (sodium) intake to 2,300 mg a day. If you have hypertension, you may need to reduce your sodium intake to 1,500 mg a day. When on the DASH eating plan, aim  to eat more fresh fruits and vegetables, whole grains, lean proteins, low-fat dairy, and heart-healthy fats. Work with your health care provider or diet and nutrition specialist (dietitian) to adjust your eating plan to your individual calorie needs. This information is not intended to replace advice given to you by your health care provider.  Make sure you discuss any questions you have with your health care provider. Document Released: 12/16/2010 Document Revised: 12/09/2016 Document Reviewed: 12/21/2015 Elsevier Patient Education  2020 [employer].

## 2021-02-10 ENCOUNTER — Other Ambulatory Visit: Payer: Self-pay

## 2021-02-12 DIAGNOSIS — I129 Hypertensive chronic kidney disease with stage 1 through stage 4 chronic kidney disease, or unspecified chronic kidney disease: Secondary | ICD-10-CM | POA: Diagnosis not present

## 2021-02-12 DIAGNOSIS — N184 Chronic kidney disease, stage 4 (severe): Secondary | ICD-10-CM | POA: Diagnosis not present

## 2021-02-15 ENCOUNTER — Other Ambulatory Visit: Payer: Self-pay

## 2021-02-15 ENCOUNTER — Other Ambulatory Visit (HOSPITAL_COMMUNITY): Payer: Self-pay | Admitting: Vascular Surgery

## 2021-02-15 DIAGNOSIS — N184 Chronic kidney disease, stage 4 (severe): Secondary | ICD-10-CM

## 2021-02-15 DIAGNOSIS — I129 Hypertensive chronic kidney disease with stage 1 through stage 4 chronic kidney disease, or unspecified chronic kidney disease: Secondary | ICD-10-CM

## 2021-02-15 NOTE — Progress Notes (Signed)
ASSESSMENT & PLAN:  51 y.o. male with CKD 5 in need of permanent dialysis access.  His GFR has been 10 over the past month.  He has limited understanding of his medical problems.  I again explained to him that he likely will need dialysis.  He seems surprised by this news, but I have seen other physicians document this discussion with him as well.  He is left-handed.  I will plan to create a right brachiocephalic arteriovenous fistula in the near future for him.  We will also place a TDC at the same time to facilitate dialysis access.  I will ask the nephrology team to see him while he is in-house.  After he initiates dialysis, I can obtain a CT angiogram of his abdomen and pelvis; and repair his abdominal aortic aneurysm, which is now 6 cm.  CHIEF COMPLAINT:   AAA  HISTORY:  HISTORY OF PRESENT ILLNESS: David [name] is a 51 y.o. male with COVID-19 infection, CKD 4, incidental note of abdominal aortic aneurysm during recent ER visit.  He is originally from the [place] and works as a Geophysicist/field seismologist.  He speaks [place], but does not seem to understand some of what I am saying to him.  His wife is along with him and seems to speak better [place].  He has severe hypertension, and is on a 5 drug regimen for this.  He has no knowledge of his severe renal insufficiency, and was surprised when I mentioned it in the office today.  He has no abdominal pain.  02/16/21: Patient presents to clinic for evaluation after recent admission for hypertensive emergency.  He was found to have chronic lacunar strokes and microhemorrhages with a small right lacunar infarct.  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  Abdominal ultrasound showed interval growth of his abdominal aortic aneurysm to 6.1 cm.  He is at the threshold for repair.  Unfortunately his GFR has deteriorated significantly.  Dr. Augustin Coupe saw him 01/17/2021, and was concerned that he would progress to end-stage renal disease.  Past Medical History:   Diagnosis Date   AAA (abdominal aortic aneurysm) 11/2019   Chronic kidney disease    Hypertension    Proteinuria 12/2019   Vitamin D deficiency     Past Surgical History:  Procedure Laterality Date   none      Family History  Problem Relation Age of Onset   Hypertension Mother    Hypertension Father    Hypertension Brother     Social History   Socioeconomic History   Marital status: Married    Spouse name: Not on file   Number of children: 5   Years of education: Not on file   Highest education level: Not on file  Occupational History   Occupation: truck driver  Tobacco Use   Smoking status: Former   Smokeless tobacco: Never  Scientific laboratory technician Use: Never used  Substance and Sexual Activity   Alcohol use: No   Drug use: No   Sexual activity: Not on file  Other Topics Concern   Not on file  Social History Narrative   Not on file   Social Determinants of Health   Financial Resource Strain: Low Risk    Difficulty of Paying Living Expenses: Not hard at all  Food Insecurity: No Food Insecurity   Worried About Charity fundraiser in the Last Year: Never true   [place] in the Last Year: Never true  Transportation  Needs: No Transportation Needs   Lack of Transportation (Medical): No   Lack of Transportation (Non-Medical): No  Physical Activity: Insufficiently Active   Days of Exercise per Week: 3 days   Minutes of Exercise per Session: 30 min  Stress: Not on file  Social Connections: Not on file  Intimate Partner Violence: Not on file    Allergies  Allergen Reactions   Hydralazine     Dizziness, abnormal sensation, nauseous    Current Outpatient Medications  Medication Sig Dispense Refill   aspirin 81 MG EC tablet Take 1 tablet (81 mg total) by mouth daily. Swallow whole. 30 tablet 11   atorvastatin (LIPITOR) 80 MG tablet Take 1 tablet (80 mg total) by mouth daily. 30 tablet 2   chlorthalidone (HYGROTON) 25 MG tablet Take 1 tablet (25 mg  total) by mouth daily. 90 tablet 3   cloNIDine (CATAPRES) 0.3 MG tablet Take 1 tablet (0.3 mg total) by mouth 2 (two) times daily. 60 tablet 3   amLODipine (NORVASC) 10 MG tablet TAKE 1 TABLET (10 MG TOTAL) BY MOUTH DAILY. (Patient taking differently: Take 10 mg by mouth daily.) 90 tablet 3   carvedilol (COREG) 25 MG tablet TAKE 1 TABLET (25 MG TOTAL) BY MOUTH 2 (TWO) TIMES DAILY. (Patient taking differently: Take 25 mg by mouth 2 (two) times daily with a meal.) 180 tablet 3   No current facility-administered medications for this visit.    REVIEW OF SYSTEMS:  [X]  denotes positive finding, [ ]  denotes negative finding Cardiac  Comments:  Chest pain or chest pressure:    Shortness of breath upon exertion:    Short of breath when lying flat:    Irregular heart rhythm:        Vascular    Pain in calf, thigh, or hip brought on by ambulation:    Pain in feet at night that wakes you up from your sleep:     Blood clot in your veins:    Leg swelling:         Pulmonary    Oxygen at home:    Productive cough:     Wheezing:         Neurologic    Sudden weakness in arms or legs:     Sudden numbness in arms or legs:     Sudden onset of difficulty speaking or slurred speech:    Temporary loss of vision in one eye:     Problems with dizziness:         Gastrointestinal    Blood in stool:     Vomited blood:         Genitourinary    Burning when urinating:     Blood in urine:        Psychiatric    Major depression:         Hematologic    Bleeding problems:    Problems with blood clotting too easily:        Skin    Rashes or ulcers:        Constitutional    Fever or chills:     PHYSICAL EXAM:   Vitals:   02/16/21 1522  BP: (!) 157/113  Pulse: 84  Resp: 20  Temp: 99 F (37.2 C)  SpO2: 97%  Weight: 208 lb (94.3 kg)  Height: 5\' 6"  (1.676 m)    Constitutional: Well appearing in no distress. Appears well nourished.  Neurologic: Normal gait and station. CN intact. No  weakness. No sensory  loss. Psychiatric: Mood and affect symmetric and appropriate. Eyes: No icterus. No conjunctival pallor. Ears, nose, throat: mucous membranes moist. Midline trachea. No carotid bruit. Cardiac: regular rate and rhythm.  Respiratory: unlabored. Abdominal: soft, non-tender, non-distended. (+) palpable pulsatile abdominal mass. Peripheral vascular:  Radial pulse: L 2+ / R 2+  Dorsalis pedis pulse: L 2+ / R 2+ Extremity: No edema. No cyanosis. No pallor.  Skin: No gangrene. No ulceration.  Lymphatic: No Stemmer's sign. No palpable lymphadenopathy.   DATA REVIEW:    Most recent CBC CBC Latest Ref Rng & Units 01/18/2021 01/17/2021 01/16/2021  WBC 4.0 - 10.5 K/uL 4.3 3.9(L) 4.8  Hemoglobin 13.0 - 17.0 g/dL 11.5(L) 12.8(L) 13.0  Hematocrit 39.0 - 52.0 % 36.1(L) 40.0 39.6  Platelets 150 - 400 K/uL 207 230 158     Most recent CMP CMP Latest Ref Rng & Units 01/18/2021 01/17/2021 01/16/2021  Glucose 70 - 99 mg/dL 91 104(H) 117(H)  BUN 6 - 20 mg/dL 45(H) 41(H) 38(H)  Creatinine 0.61 - 1.24 mg/dL 6.40(H) 6.53(H) 6.03(H)  Sodium 135 - 145 mmol/L 135 134(L) 133(L)  Potassium 3.5 - 5.1 mmol/L 3.9 3.8 3.9  Chloride 98 - 111 mmol/L 97(L) 101 100  CO2 22 - 32 mmol/L 27 23 25   Calcium 8.9 - 10.3 mg/dL 8.8(L) 9.0 9.0  Total Protein 6.5 - 8.1 g/dL - - -  Total Bilirubin 0.3 - 1.2 mg/dL - - -  Alkaline Phos 38 - 126 U/L - - -  AST 15 - 41 U/L - - -  ALT 0 - 44 U/L - - -    Renal function CrCl cannot be calculated (Patient's most recent lab result is older than the maximum 21 days allowed.).  Hgb A1c MFr Bld (%)  Date Value  01/15/2021 5.5    LDL Chol Calc (NIH)  Date Value Ref Range Status  12/24/2019 135 (H) 0 - 99 mg/dL Final   LDL Cholesterol  Date Value Ref Range Status  01/15/2021 253 (H) 0 - 99 mg/dL Final    Comment:           Total Cholesterol/HDL:CHD Risk Coronary Heart Disease Risk Table                     Men   Women  1/2 Average Risk   3.4   3.3  Average  Risk       5.0   4.4  2 X Average Risk   9.6   7.1  3 X Average Risk  23.4   11.0        Use the calculated Patient Ratio above and the CHD Risk Table to determine the patient's CHD Risk.        ATP III CLASSIFICATION (LDL):  <100     mg/dL   Optimal  100-129  mg/dL   Near or Above                    Optimal  130-159  mg/dL   Borderline  160-189  mg/dL   High  >190     mg/dL   Very High Performed at [facility] [address], [place], [place] [address]    Direct LDL  Date Value Ref Range Status  01/15/2021 257.5 (H) 0 - 99 mg/dL Final    Comment:    Performed at [facility] [address] [place], [place] [address]     Vascular Imaging: CT of abdomen /  pelvis without contrast 12/04/19. 37mm infrarenal abdominal aortic aneurysm. Evaluation severely limited by lack of contrast.   Preoperative vein mapping shows suitable right cephalic vein for autogenous dialysis access.  Yevonne Aline. Stanford Breed, MD [facility] Phone Number: [phone] 02/16/2021 5:38 PM

## 2021-02-15 NOTE — Progress Notes (Signed)
Error

## 2021-02-16 ENCOUNTER — Ambulatory Visit (INDEPENDENT_AMBULATORY_CARE_PROVIDER_SITE_OTHER)
Admission: RE | Admit: 2021-02-16 | Discharge: 2021-02-16 | Disposition: A | Discharge status: Home / Self Care | Payer: BC Managed Care – PPO | Source: Ambulatory Visit | Attending: Vascular Surgery | Admitting: Vascular Surgery

## 2021-02-16 ENCOUNTER — Ambulatory Visit (INDEPENDENT_AMBULATORY_CARE_PROVIDER_SITE_OTHER): Payer: BC Managed Care – PPO | Admitting: Vascular Surgery

## 2021-02-16 ENCOUNTER — Encounter: Payer: Self-pay | Admitting: Vascular Surgery

## 2021-02-16 ENCOUNTER — Telehealth (HOSPITAL_COMMUNITY): Payer: Self-pay | Admitting: *Deleted

## 2021-02-16 ENCOUNTER — Other Ambulatory Visit: Payer: Self-pay

## 2021-02-16 VITALS — BP 157/113 | HR 84 | Temp 99.0°F | Resp 20 | Ht 66.0 in | Wt 208.0 lb

## 2021-02-16 DIAGNOSIS — Z5181 Encounter for therapeutic drug level monitoring: Secondary | ICD-10-CM | POA: Diagnosis not present

## 2021-02-16 DIAGNOSIS — R011 Cardiac murmur, unspecified: Secondary | ICD-10-CM | POA: Diagnosis not present

## 2021-02-16 DIAGNOSIS — R778 Other specified abnormalities of plasma proteins: Secondary | ICD-10-CM | POA: Diagnosis not present

## 2021-02-16 DIAGNOSIS — I722 Aneurysm of renal artery: Secondary | ICD-10-CM | POA: Diagnosis not present

## 2021-02-16 DIAGNOSIS — Z5309 Procedure and treatment not carried out because of other contraindication: Secondary | ICD-10-CM | POA: Diagnosis not present

## 2021-02-16 DIAGNOSIS — Z23 Encounter for immunization: Secondary | ICD-10-CM | POA: Diagnosis not present

## 2021-02-16 DIAGNOSIS — N184 Chronic kidney disease, stage 4 (severe): Secondary | ICD-10-CM

## 2021-02-16 DIAGNOSIS — D631 Anemia in chronic kidney disease: Secondary | ICD-10-CM | POA: Diagnosis not present

## 2021-02-16 DIAGNOSIS — I129 Hypertensive chronic kidney disease with stage 1 through stage 4 chronic kidney disease, or unspecified chronic kidney disease: Secondary | ICD-10-CM | POA: Diagnosis not present

## 2021-02-16 DIAGNOSIS — R001 Bradycardia, unspecified: Secondary | ICD-10-CM | POA: Diagnosis not present

## 2021-02-16 DIAGNOSIS — N185 Chronic kidney disease, stage 5: Secondary | ICD-10-CM | POA: Diagnosis not present

## 2021-02-16 DIAGNOSIS — I700 Atherosclerosis of aorta: Secondary | ICD-10-CM | POA: Diagnosis not present

## 2021-02-16 DIAGNOSIS — Z8616 Personal history of COVID-19: Secondary | ICD-10-CM | POA: Diagnosis not present

## 2021-02-16 DIAGNOSIS — I714 Abdominal aortic aneurysm, without rupture, unspecified: Secondary | ICD-10-CM | POA: Diagnosis not present

## 2021-02-16 DIAGNOSIS — Z20822 Contact with and (suspected) exposure to covid-19: Secondary | ICD-10-CM | POA: Diagnosis not present

## 2021-02-16 DIAGNOSIS — I712 Thoracic aortic aneurysm, without rupture, unspecified: Secondary | ICD-10-CM | POA: Diagnosis not present

## 2021-02-16 DIAGNOSIS — Z8249 Family history of ischemic heart disease and other diseases of the circulatory system: Secondary | ICD-10-CM | POA: Diagnosis not present

## 2021-02-16 DIAGNOSIS — Z79899 Other long term (current) drug therapy: Secondary | ICD-10-CM | POA: Diagnosis not present

## 2021-02-16 DIAGNOSIS — I161 Hypertensive emergency: Secondary | ICD-10-CM | POA: Diagnosis not present

## 2021-02-16 DIAGNOSIS — I517 Cardiomegaly: Secondary | ICD-10-CM | POA: Diagnosis not present

## 2021-02-16 DIAGNOSIS — Z888 Allergy status to other drugs, medicaments and biological substances status: Secondary | ICD-10-CM | POA: Diagnosis not present

## 2021-02-16 DIAGNOSIS — N250 Renal osteodystrophy: Secondary | ICD-10-CM | POA: Diagnosis not present

## 2021-02-16 DIAGNOSIS — Z515 Encounter for palliative care: Secondary | ICD-10-CM | POA: Diagnosis not present

## 2021-02-16 DIAGNOSIS — I7143 Infrarenal abdominal aortic aneurysm, without rupture: Secondary | ICD-10-CM | POA: Diagnosis not present

## 2021-02-16 DIAGNOSIS — Z7189 Other specified counseling: Secondary | ICD-10-CM | POA: Diagnosis not present

## 2021-02-16 DIAGNOSIS — Z7982 Long term (current) use of aspirin: Secondary | ICD-10-CM | POA: Diagnosis not present

## 2021-02-16 DIAGNOSIS — N186 End stage renal disease: Secondary | ICD-10-CM | POA: Diagnosis not present

## 2021-02-16 DIAGNOSIS — I10 Essential (primary) hypertension: Secondary | ICD-10-CM | POA: Diagnosis not present

## 2021-02-16 DIAGNOSIS — I160 Hypertensive urgency: Secondary | ICD-10-CM | POA: Diagnosis not present

## 2021-02-16 DIAGNOSIS — I7141 Pararenal abdominal aortic aneurysm, without rupture: Secondary | ICD-10-CM | POA: Diagnosis not present

## 2021-02-16 DIAGNOSIS — Z8673 Personal history of transient ischemic attack (TIA), and cerebral infarction without residual deficits: Secondary | ICD-10-CM | POA: Diagnosis not present

## 2021-02-16 DIAGNOSIS — Z66 Do not resuscitate: Secondary | ICD-10-CM | POA: Diagnosis not present

## 2021-02-16 DIAGNOSIS — E785 Hyperlipidemia, unspecified: Secondary | ICD-10-CM | POA: Diagnosis not present

## 2021-02-16 DIAGNOSIS — Z87891 Personal history of nicotine dependence: Secondary | ICD-10-CM | POA: Diagnosis not present

## 2021-02-16 DIAGNOSIS — I120 Hypertensive chronic kidney disease with stage 5 chronic kidney disease or end stage renal disease: Secondary | ICD-10-CM | POA: Diagnosis not present

## 2021-02-16 NOTE — Telephone Encounter (Signed)
Close encounter 

## 2021-02-17 ENCOUNTER — Ambulatory Visit (HOSPITAL_COMMUNITY): Payer: BC Managed Care – PPO | Admitting: Physician Assistant

## 2021-02-17 ENCOUNTER — Other Ambulatory Visit: Payer: Self-pay

## 2021-02-17 ENCOUNTER — Encounter (HOSPITAL_COMMUNITY): Payer: Self-pay | Admitting: Vascular Surgery

## 2021-02-17 ENCOUNTER — Ambulatory Visit (HOSPITAL_BASED_OUTPATIENT_CLINIC_OR_DEPARTMENT_OTHER)
Admission: RE | Admit: 2021-02-17 | Discharge: 2021-02-17 | Disposition: A | Discharge status: Home / Self Care | Payer: BC Managed Care – PPO | Source: Ambulatory Visit | Attending: Cardiovascular Disease | Admitting: Cardiovascular Disease

## 2021-02-17 DIAGNOSIS — I517 Cardiomegaly: Secondary | ICD-10-CM | POA: Diagnosis not present

## 2021-02-17 MED ORDER — TECHNETIUM TC 99M PYROPHOSPHATE
20.9000 | Freq: Once | INTRAVENOUS | Status: AC
  Administered 2021-02-17: 20.9 via INTRAVENOUS

## 2021-02-17 NOTE — Progress Notes (Signed)
DUE TO COVID-19 ONLY ONE VISITOR IS ALLOWED TO COME WITH YOU AND STAY IN THE WAITING ROOM ONLY DURING PRE OP AND PROCEDURE DAY OF SURGERY.   PCP - Dionisio David, NP Cardiologist - Dr Skeet Latch  Chest x-ray - 01/17/21 (1V) EKG - 02/09/21 Stress Test - n/a ECHO - 01/15/21 Cardiac Cath - n/a  ICD Pacemaker/Loop - n/a  Sleep Study -  n/a CPAP - none  Anesthesia review: Yes  STOP now taking any Aspirin (unless otherwise instructed by your surgeon), Aleve, Naproxen, Ibuprofen, Motrin, Advil, Goody's, BC's, all herbal medications, fish oil, and all vitamins.   Coronavirus Screening Covid test n/a Ambulatory Surgery   Do you have any of the following symptoms:  Cough yes/no: No Fever (>100.53F)  yes/no: No Runny nose yes/no: No Sore throat yes/no: No Difficulty breathing/shortness of breath  yes/no: No  Have you traveled in the last 14 days and where? yes/no: No  Patient verbalized understanding of instructions that were given via phone.

## 2021-02-18 ENCOUNTER — Encounter (HOSPITAL_COMMUNITY)
Admission: RE | Disposition: A | Discharge status: Home / Self Care | Payer: Self-pay | Source: Ambulatory Visit | Attending: Internal Medicine

## 2021-02-18 ENCOUNTER — Encounter (HOSPITAL_COMMUNITY): Payer: Self-pay | Admitting: Anesthesiology

## 2021-02-18 ENCOUNTER — Ambulatory Visit (HOSPITAL_COMMUNITY): Payer: BC Managed Care – PPO

## 2021-02-18 ENCOUNTER — Inpatient Hospital Stay (HOSPITAL_COMMUNITY): Payer: BC Managed Care – PPO

## 2021-02-18 ENCOUNTER — Inpatient Hospital Stay (HOSPITAL_COMMUNITY)
Admission: RE | Admit: 2021-02-18 | Discharge: 2021-02-21 | DRG: 305 | Disposition: A | Discharge status: Home / Self Care | Payer: BC Managed Care – PPO | Source: Ambulatory Visit | Attending: Internal Medicine | Admitting: Internal Medicine

## 2021-02-18 ENCOUNTER — Encounter (HOSPITAL_COMMUNITY): Payer: Self-pay | Admitting: Vascular Surgery

## 2021-02-18 DIAGNOSIS — Z23 Encounter for immunization: Secondary | ICD-10-CM | POA: Diagnosis not present

## 2021-02-18 DIAGNOSIS — R778 Other specified abnormalities of plasma proteins: Secondary | ICD-10-CM | POA: Diagnosis present

## 2021-02-18 DIAGNOSIS — N186 End stage renal disease: Secondary | ICD-10-CM

## 2021-02-18 DIAGNOSIS — Z7982 Long term (current) use of aspirin: Secondary | ICD-10-CM | POA: Diagnosis not present

## 2021-02-18 DIAGNOSIS — Z8249 Family history of ischemic heart disease and other diseases of the circulatory system: Secondary | ICD-10-CM | POA: Diagnosis not present

## 2021-02-18 DIAGNOSIS — I700 Atherosclerosis of aorta: Secondary | ICD-10-CM | POA: Diagnosis not present

## 2021-02-18 DIAGNOSIS — I714 Abdominal aortic aneurysm, without rupture, unspecified: Secondary | ICD-10-CM

## 2021-02-18 DIAGNOSIS — D631 Anemia in chronic kidney disease: Secondary | ICD-10-CM | POA: Diagnosis present

## 2021-02-18 DIAGNOSIS — R011 Cardiac murmur, unspecified: Secondary | ICD-10-CM

## 2021-02-18 DIAGNOSIS — Z66 Do not resuscitate: Secondary | ICD-10-CM | POA: Diagnosis present

## 2021-02-18 DIAGNOSIS — Z20822 Contact with and (suspected) exposure to covid-19: Secondary | ICD-10-CM | POA: Diagnosis present

## 2021-02-18 DIAGNOSIS — N185 Chronic kidney disease, stage 5: Secondary | ICD-10-CM | POA: Diagnosis present

## 2021-02-18 DIAGNOSIS — Z8673 Personal history of transient ischemic attack (TIA), and cerebral infarction without residual deficits: Secondary | ICD-10-CM

## 2021-02-18 DIAGNOSIS — I10 Essential (primary) hypertension: Secondary | ICD-10-CM

## 2021-02-18 DIAGNOSIS — Z5309 Procedure and treatment not carried out because of other contraindication: Secondary | ICD-10-CM | POA: Diagnosis present

## 2021-02-18 DIAGNOSIS — I7143 Infrarenal abdominal aortic aneurysm, without rupture: Secondary | ICD-10-CM | POA: Diagnosis present

## 2021-02-18 DIAGNOSIS — Z888 Allergy status to other drugs, medicaments and biological substances status: Secondary | ICD-10-CM

## 2021-02-18 DIAGNOSIS — Z79899 Other long term (current) drug therapy: Secondary | ICD-10-CM

## 2021-02-18 DIAGNOSIS — I160 Hypertensive urgency: Secondary | ICD-10-CM

## 2021-02-18 DIAGNOSIS — Z7189 Other specified counseling: Secondary | ICD-10-CM | POA: Diagnosis not present

## 2021-02-18 DIAGNOSIS — E785 Hyperlipidemia, unspecified: Secondary | ICD-10-CM | POA: Diagnosis present

## 2021-02-18 DIAGNOSIS — Z87891 Personal history of nicotine dependence: Secondary | ICD-10-CM | POA: Diagnosis not present

## 2021-02-18 DIAGNOSIS — I120 Hypertensive chronic kidney disease with stage 5 chronic kidney disease or end stage renal disease: Secondary | ICD-10-CM | POA: Diagnosis present

## 2021-02-18 DIAGNOSIS — R001 Bradycardia, unspecified: Secondary | ICD-10-CM | POA: Diagnosis present

## 2021-02-18 DIAGNOSIS — I712 Thoracic aortic aneurysm, without rupture, unspecified: Secondary | ICD-10-CM | POA: Diagnosis present

## 2021-02-18 DIAGNOSIS — I161 Hypertensive emergency: Principal | ICD-10-CM | POA: Diagnosis present

## 2021-02-18 DIAGNOSIS — Z515 Encounter for palliative care: Secondary | ICD-10-CM | POA: Diagnosis not present

## 2021-02-18 DIAGNOSIS — Z8616 Personal history of COVID-19: Secondary | ICD-10-CM

## 2021-02-18 DIAGNOSIS — I722 Aneurysm of renal artery: Secondary | ICD-10-CM | POA: Diagnosis not present

## 2021-02-18 LAB — ECHOCARDIOGRAM COMPLETE
AR max vel: 4.41 cm2
AV Area VTI: 4.65 cm2
AV Area mean vel: 4.23 cm2
AV Mean grad: 4.5 mmHg
AV Peak grad: 8.3 mmHg
Ao pk vel: 1.44 m/s
Area-P 1/2: 1.94 cm2
Calc EF: 51.8 %
Height: 66 in
MV VTI: 3.83 cm2
S' Lateral: 2.4 cm
Single Plane A2C EF: 54.7 %
Single Plane A4C EF: 45.6 %
Weight: 3328 oz

## 2021-02-18 LAB — BASIC METABOLIC PANEL
Anion gap: 10 (ref 5–15)
BUN: 51 mg/dL — ABNORMAL HIGH (ref 6–20)
CO2: 24 mmol/L (ref 22–32)
Calcium: 8.6 mg/dL — ABNORMAL LOW (ref 8.9–10.3)
Chloride: 104 mmol/L (ref 98–111)
Creatinine, Ser: 6.15 mg/dL — ABNORMAL HIGH (ref 0.61–1.24)
GFR, Estimated: 10 mL/min — ABNORMAL LOW (ref >60)
Glucose, Bld: 94 mg/dL (ref 70–99)
Potassium: 4.1 mmol/L (ref 3.5–5.1)
Sodium: 138 mmol/L (ref 135–145)

## 2021-02-18 LAB — POCT I-STAT, CHEM 8
BUN: 46 mg/dL — ABNORMAL HIGH (ref 6–20)
Calcium, Ion: 1.11 mmol/L — ABNORMAL LOW (ref 1.15–1.40)
Chloride: 103 mmol/L (ref 98–111)
Creatinine, Ser: 6.5 mg/dL — ABNORMAL HIGH (ref 0.61–1.24)
Glucose, Bld: 90 mg/dL (ref 70–99)
HCT: 39 % (ref 39.0–52.0)
Hemoglobin: 13.3 g/dL (ref 13.0–17.0)
Potassium: 3.9 mmol/L (ref 3.5–5.1)
Sodium: 139 mmol/L (ref 135–145)
TCO2: 25 mmol/L (ref 22–32)

## 2021-02-18 LAB — CBC
HCT: 36.7 % — ABNORMAL LOW (ref 39.0–52.0)
Hemoglobin: 11.7 g/dL — ABNORMAL LOW (ref 13.0–17.0)
MCH: 27 pg (ref 26.0–34.0)
MCHC: 31.9 g/dL (ref 30.0–36.0)
MCV: 84.8 fL (ref 80.0–100.0)
Platelets: 243 10*3/uL (ref 150–400)
RBC: 4.33 MIL/uL (ref 4.22–5.81)
RDW: 13.7 % (ref 11.5–15.5)
WBC: 4.1 10*3/uL (ref 4.0–10.5)
nRBC: 0 % (ref 0.0–0.2)

## 2021-02-18 LAB — TROPONIN I (HIGH SENSITIVITY)
Troponin I (High Sensitivity): 48 ng/L — ABNORMAL HIGH (ref <18)
Troponin I (High Sensitivity): 50 ng/L — ABNORMAL HIGH (ref <18)

## 2021-02-18 LAB — RESP PANEL BY RT-PCR (FLU A&B, COVID) ARPGX2
Influenza A by PCR: NEGATIVE
Influenza B by PCR: NEGATIVE
SARS Coronavirus 2 by RT PCR: NEGATIVE

## 2021-02-18 SURGERY — ARTERIOVENOUS (AV) FISTULA CREATION
Anesthesia: Monitor Anesthesia Care | Laterality: Right

## 2021-02-18 MED ORDER — ACETAMINOPHEN 325 MG PO TABS: 650.0000 mg | ORAL_TABLET | Freq: Four times a day (QID) | ORAL | Status: DC | PRN

## 2021-02-18 MED ORDER — HEPARIN SODIUM (PORCINE) 1000 UNIT/ML IJ SOLN
INTRAMUSCULAR | Status: AC
  Filled 2021-02-18: qty 10

## 2021-02-18 MED ORDER — LIDOCAINE-EPINEPHRINE (PF) 1 %-1:200000 IJ SOLN
INTRAMUSCULAR | Status: AC
  Filled 2021-02-18: qty 30

## 2021-02-18 MED ORDER — CHLORTHALIDONE 25 MG PO TABS
25.0000 mg | ORAL_TABLET | Freq: Every day | ORAL | Status: DC
Start: 2021-02-18 — End: 2021-02-21
  Administered 2021-02-18 – 2021-02-21 (×4): 25 mg via ORAL
  Filled 2021-02-18 (×4): qty 1

## 2021-02-18 MED ORDER — CARVEDILOL 25 MG PO TABS
25.0000 mg | ORAL_TABLET | Freq: Two times a day (BID) | ORAL | Status: DC
  Administered 2021-02-18 – 2021-02-20 (×6): 25 mg via ORAL
  Filled 2021-02-18 (×2): qty 1
  Filled 2021-02-18: qty 2
  Filled 2021-02-18 (×3): qty 1
  Filled 2021-02-18: qty 2

## 2021-02-18 MED ORDER — ACETAMINOPHEN 650 MG RE SUPP: 650.0000 mg | Freq: Four times a day (QID) | RECTAL | Status: DC | PRN

## 2021-02-18 MED ORDER — MIDAZOLAM HCL 2 MG/2ML IJ SOLN
INTRAMUSCULAR | Status: AC
  Filled 2021-02-18: qty 2

## 2021-02-18 MED ORDER — CARVEDILOL 12.5 MG PO TABS
ORAL_TABLET | ORAL | Status: AC
  Administered 2021-02-18: 25 mg via ORAL
  Filled 2021-02-18: qty 2

## 2021-02-18 MED ORDER — CEFAZOLIN SODIUM-DEXTROSE 2-4 GM/100ML-% IV SOLN
INTRAVENOUS | Status: AC
  Filled 2021-02-18: qty 100

## 2021-02-18 MED ORDER — AMLODIPINE BESYLATE 10 MG PO TABS
10.0000 mg | ORAL_TABLET | Freq: Every day | ORAL | Status: DC
  Administered 2021-02-18 – 2021-02-21 (×4): 10 mg via ORAL
  Filled 2021-02-18: qty 2
  Filled 2021-02-18 (×3): qty 1

## 2021-02-18 MED ORDER — CLONIDINE HCL 0.2 MG PO TABS
0.3000 mg | ORAL_TABLET | Freq: Two times a day (BID) | ORAL | Status: DC
  Administered 2021-02-18 – 2021-02-20 (×5): 0.3 mg via ORAL
  Filled 2021-02-18 (×5): qty 1

## 2021-02-18 MED ORDER — HEPARIN 6000 UNIT IRRIGATION SOLUTION
Status: AC
  Filled 2021-02-18: qty 500

## 2021-02-18 MED ORDER — CHLORHEXIDINE GLUCONATE 0.12 % MT SOLN
OROMUCOSAL | Status: AC
  Filled 2021-02-18: qty 15

## 2021-02-18 MED ORDER — ATORVASTATIN CALCIUM 80 MG PO TABS
80.0000 mg | ORAL_TABLET | Freq: Every day | ORAL | Status: DC
  Administered 2021-02-18 – 2021-02-21 (×4): 80 mg via ORAL
  Filled 2021-02-18: qty 1
  Filled 2021-02-18: qty 2
  Filled 2021-02-18 (×2): qty 1

## 2021-02-18 MED ORDER — PAPAVERINE HCL 30 MG/ML IJ SOLN
INTRAMUSCULAR | Status: AC
  Filled 2021-02-18: qty 2

## 2021-02-18 MED ORDER — HEPARIN SODIUM (PORCINE) 5000 UNIT/ML IJ SOLN
5000.0000 [IU] | Freq: Three times a day (TID) | INTRAMUSCULAR | Status: DC
  Administered 2021-02-18 – 2021-02-21 (×8): 5000 [IU] via SUBCUTANEOUS
  Filled 2021-02-18 (×8): qty 1

## 2021-02-18 MED ORDER — CARVEDILOL 25 MG PO TABS: 25.0000 mg | ORAL_TABLET | Freq: Once | ORAL | Status: AC

## 2021-02-18 MED ORDER — NITROGLYCERIN IN D5W 200-5 MCG/ML-% IV SOLN
0.0000 ug/min | INTRAVENOUS | Status: DC
  Administered 2021-02-18: 12:00:00 5 ug/min via INTRAVENOUS
  Administered 2021-02-19: 10 ug/min via INTRAVENOUS
  Filled 2021-02-18 (×2): qty 250

## 2021-02-18 MED ORDER — IOHEXOL 350 MG/ML SOLN
100.0000 mL | Freq: Once | INTRAVENOUS | Status: AC | PRN
  Administered 2021-02-18: 100 mL via INTRAVENOUS

## 2021-02-18 MED ORDER — FENTANYL CITRATE (PF) 250 MCG/5ML IJ SOLN
INTRAMUSCULAR | Status: AC
  Filled 2021-02-18: qty 5

## 2021-02-18 NOTE — H&P (Signed)
NAME:  David [name], MRN:  [id number], DOB:  [birth year], LOS: 0 ADMISSION DATE:  02/18/2021, Primary: Vevelyn Francois, NP  CHIEF COMPLAINT:  elevated blood pressures   Medical Service: Internal Medicine Teaching Service         Attending Physician: Dr. Aldine Contes, MD    First Contact: Dr. Posey Pronto Pager: [phone]  Second Contact: Dr. Eulas Post Pager: [phone]       After Hours (After 5p/  First Contact Pager: [phone]  weekends / holidays): Second Contact Pager: [phone]    History of present illness   David [name] is a 51 year old male with uncontrolled hypertension, AAA, and CKD V who had presented to the pre-operative area this morning, anticipating undergoing AVF formation by Dr. Standley Dakins. He was noted to be severely hypertensive, with systolic blood pressures in the 230s. Surgery was canceled and he was transferred to the ED for blood pressure management.  He says that he checks his home blood pressures regularly--systolics range 240-973ZHGD, diastolics range 924-268TMHD.  He reports medication adherence. He was instructed to not take anything by mouth this morning so he did not taking his morning medications which include amlodipine, chlorthalidone, clonidine and coreg.  He denies headache, focal neurologic changes, vision changes, chest pain, or shortness of breath.   Recently admitted to the ICU last month for hypertensive emergency and PRES. Denies any residual deficits.  Past Medical History  He,  has a past medical history of AAA (abdominal aortic aneurysm) (11/2019), Chronic kidney disease, Hypertension, Proteinuria (12/2019), and Vitamin D deficiency.   Home Medications     Prior to Admission medications   Medication Sig Start Date End Date Taking? Authorizing Provider  amLODipine (NORVASC) 10 MG tablet TAKE 1 TABLET (10 MG TOTAL) BY MOUTH DAILY. Patient taking differently: Take 10 mg by mouth daily. 12/24/19 02/17/22 Yes Azzie Glatter, FNP  aspirin 81 MG EC tablet Take 1  tablet (81 mg total) by mouth daily. Swallow whole. 01/18/21  Yes Agarwala, Einar Grad, MD  atorvastatin (LIPITOR) 80 MG tablet Take 1 tablet (80 mg total) by mouth daily. 01/18/21  Yes Agarwala, Einar Grad, MD  carvedilol (COREG) 25 MG tablet TAKE 1 TABLET (25 MG TOTAL) BY MOUTH 2 (TWO) TIMES DAILY. 12/24/19 02/18/23 Yes Azzie Glatter, FNP  cloNIDine (CATAPRES) 0.3 MG tablet Take 1 tablet (0.3 mg total) by mouth 2 (two) times daily. 01/29/21 05/29/21 Yes Vevelyn Francois, NP  chlorthalidone (HYGROTON) 25 MG tablet Take 1 tablet (25 mg total) by mouth daily. Patient not taking: Reported on 02/17/2021 02/09/21 05/11/21  Skeet Latch, MD    Allergies    Allergies as of 02/16/2021 - Review Complete 02/16/2021  Allergen Reaction Noted   Hydralazine  01/16/2021    Social History   reports that he has quit smoking. His smoking use included cigarettes. He has never used smokeless tobacco. He reports that he does not drink alcohol and does not use drugs.   Family History   His family history includes Hypertension in his brother, father, and mother.   ROS  10 point review of systems negative unless stated in the HPI.  Objective   Blood pressure (!) 143/100, pulse (!) 55, temperature 98 F (36.7 C), resp. rate 13, height 5\' 6"  (1.676 m), weight 94.3 kg, SpO2 98 %.    General: well appearing in no distress Eyes: no scleral icterus or conjunctival injection HENT: MMM, head atraumatic Cardiac: RRR, 4/6 systolic murmur, no LE edema, no abdominal bruit Pulm: breathing comfortably on  room air, lungs clear GI: abdomen soft, non-tender, non-distended Skin: no rash or lesion MSK: normal bulk and tone Neuro: a/o x4. Moves all extremities. No facial droop. Normal speech. Significant Diagnostic Tests:   CTA aorta:  Moderate aortic atherosclerosis. Dilated ascending aorta measuring 4.2x4.1cm. AAA with associated mural thrombus measuring 6.2 x 5.7 cm diameter 13cm long.   Labs    CBC Latest Ref Rng & Units  02/18/2021 02/18/2021 01/18/2021  WBC 4.0 - 10.5 K/uL 4.1 - 4.3  Hemoglobin 13.0 - 17.0 g/dL 11.7(L) 13.3 11.5(L)  Hematocrit 39.0 - 52.0 % 36.7(L) 39.0 36.1(L)  Platelets 150 - 400 K/uL 243 - 207   BMP Latest Ref Rng & Units 02/18/2021 02/18/2021 01/18/2021  Glucose 70 - 99 mg/dL 94 90 91  BUN 6 - 20 mg/dL 51(H) 46(H) 45(H)  Creatinine 0.61 - 1.24 mg/dL 6.15(H) 6.50(H) 6.40(H)  BUN/Creat Ratio 6 - 22 (calc) - - -  Sodium 135 - 145 mmol/L 138 139 135  Potassium 3.5 - 5.1 mmol/L 4.1 3.9 3.9  Chloride 98 - 111 mmol/L 104 103 97(L)  CO2 22 - 32 mmol/L 24 - 27  Calcium 8.9 - 10.3 mg/dL 8.6(L) - 8.8(L)    Summary  51 year old male with uncontrolled hypertension, CKD V, not on dialysis, and a 6.1cm aortic aneurysm who was sent over from short stay, where he was planning on undergoing AVF formation earlier today, due to severely elevated blood pressures.  Assessment & Plan:  Principal Problem:   Hypertensive urgency  Hypertensive urgency, Uncontrolled hypertension.  Blood pressure 221/140. Troponins mildly elevation (48>>50), likely demand related,  however no significant evidence of acute end organ damage.  ? Rebound hypertension from holding clonidine pre-operatively. If his home blood pressure readings are accurate, this would be most likely however his recent admission for hypertensive emergency argues against this being the case.  Recently admitted in January for hypertensive emergency>>PRES (admitting BP 249/150) Will resume his home medications which include amlodipine, coreg, chlorthalidone and clonidine Suspect that nitro gtt will be able to be weaned as he resumes his home medications. Would avoid holding  Titrate nitro gtt from systolic blood pressure goal of 150-149mmHg Stat head CT for acute neurologic changes  Non-oliguric CKD V. Renal disease likely a consequence of chronically uncontrolled hypertension.  Renal function is stable from last admission. No indication for emergent HD--K  4.1, not volume overloaded. AVF formation was scheduled for today however unable to be completed due to severely elevated blood pressure in pre-op. Will continue to monitor labs closely. AVF + TDC planned for 2/10 NPO after MN Monitor renal function/electrolytes. Avoid nephrotoxins. 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 Not on DAPT due to uncontrolled hypertension and numerous micro-hemorrhages  Hyperlipidemia. LDL 253 in January. Continue lipitor.  Mild asymptomatic bradycardia, PR prolongation Tele monitoring, may need to lower coreg if he becomes symptomatic  Best practice:  CODE STATUS: FULL DVT for prophylaxis: heparin Dispo: Admit patient to Inpatient with expected length of stay greater than 2 midnights.  Mitzi Hansen, MD Internal Medicine Resident PGY-3 [facility] Pager: [phone] 02/18/2021 3:35 PM

## 2021-02-18 NOTE — ED Triage Notes (Signed)
Pt brought from short stay where he was getting an AV fistula today, pt did not take am medication today and SBP was 230 when checked. Pt given carvedilol and sent to ED for disection study. Pt AOx4 with no complaints at this time

## 2021-02-18 NOTE — Progress Notes (Signed)
CT angiogram personally reviewed. No evidence of impending rupture or acute aortic syndrome. Plan right brachiocephalic AVF + [facility] tomorrow. Plan aortic repair Monday 02/22/21 with EVAR +/- iliac branch device.  David [name]. Stanford Breed, MD [facility] Phone Number: [phone] 02/18/2021 10:21 AM

## 2021-02-18 NOTE — Anesthesia Preprocedure Evaluation (Addendum)
Anesthesia Evaluation    Reviewed: Allergy & Precautions, Patient's Chart, lab work & pertinent test results, reviewed documented beta blocker date and time   Airway        Dental   Pulmonary neg pulmonary ROS, former smoker,           Cardiovascular hypertension, Pt. on medications and Pt. on home beta blockers +CHF (grade 1 diastolic dysfunction)    Echo 01/2021: 1. LVH with speckled appearance with abnormal strain apical sparing  suggests amyloid consider cardiac MRI or Tc scan to further evaluate.  2. Left ventricular ejection fraction, by estimation, is 60 to 65%. The  left ventricle has normal function. The left ventricle has no regional  wall motion abnormalities. The left ventricular internal cavity size was  mildly dilated. There is moderate  left ventricular hypertrophy. Left ventricular diastolic parameters are  consistent with Grade I diastolic dysfunction (impaired relaxation).  3. Right ventricular systolic function is normal. The right ventricular  size is normal.  4. Left atrial size was moderately dilated.  5. The mitral valve is normal in structure. No evidence of mitral valve  regurgitation. No evidence of mitral stenosis.  6. The aortic valve was not well visualized. There is mild calcification  of the aortic valve. Aortic valve regurgitation is not visualized. Aortic  valve sclerosis is present, with no evidence of aortic valve stenosis.  7. Aortic dilatation noted. There is moderate dilatation of the ascending  aorta, measuring 43 mm.  8. The inferior vena cava is normal in size with greater than 50%  respiratory variability, suggesting right atrial pressure of 3 mmHg.    Neuro/Psych CVA negative psych ROS   GI/Hepatic negative GI ROS, Neg liver ROS,   Endo/Other  Obesity BMI 34  Renal/GU ESRF and DialysisRenal diseaseNot yet on HD  negative genitourinary   Musculoskeletal negative  musculoskeletal ROS (+)   Abdominal   Peds  Hematology negative hematology ROS (+)   Anesthesia Other Findings   Reproductive/Obstetrics negative OB ROS                             Anesthesia Physical Anesthesia Plan  ASA: 4  Anesthesia Plan: General   Post-op Pain Management: Ofirmev IV (intra-op)   Induction: Intravenous  PONV Risk Score and Plan: 2 and Ondansetron, Dexamethasone, Midazolam and Treatment may vary due to age or medical condition  Airway Management Planned: Oral ETT  Additional Equipment: None  Intra-op Plan:   Post-operative Plan:   Informed Consent:   Plan Discussed with:   Anesthesia Plan Comments: (BP in preop 240-250/140-150. Pt was recently admitted for hypertensive emergency. Took all of his BP meds yesterday. D/w patient that this degree of hypertension makes him unsafe to anesthetize today, but also it is especially dangerous given his 6.1cm AAA.  D/w Dr. Stanford Breed, agrees that patient will need to be admitted for BP management and hopefully will be safe to anesthetize over the weekend for placement of fistula and dialysis catheter. )     Anesthesia Quick Evaluation

## 2021-02-18 NOTE — Progress Notes (Signed)
BP in preop 240-250/140-150 on both arms with 5-6 readings. Pt was recently admitted for hypertensive emergency. Took all of his BP meds yesterday. D/w patient that this degree of hypertension makes him unsafe to anesthetize today, but also it is especially dangerous given his 6.1cm AAA.  D/w Dr. Stanford Breed, who agrees that the patient will need to be admitted for BP management and hopefully will be safe to anesthetize over the weekend for placement of fistula and dialysis catheter.

## 2021-02-18 NOTE — ED Provider Notes (Signed)
[place] EMERGENCY DEPARTMENT Provider Note   CSN: [id number] Arrival date & time: 02/18/21  0754     History  Chief Complaint  Patient presents with   Hypertension   AAA    David [name] is a 51 y.o. male.  Patient is a 51 year old male who presents with elevated blood pressures.  He has a history of end-stage renal disease but has not yet started dialysis.  He was at the short stay today getting an AV fistula placed.  When he arrived, and it was noted that his blood pressure was markedly elevated.  They felt that it was unsafe to perform his procedure and sent him to the ED for further evaluation.  On chart review, he does have a history of an aortic aneurysm measuring 6.1 cm, infrarenal.  Also has chronic kidney disease and hypertension.  He does state he has been taking his blood pressure medicines although he did not take it this morning.  The vascular surgeon who is seeing him this morning did order a CTA of his chest abdomen pelvis to rule out further aortic injury.  The plan was to admit the patient for blood pressure control and plan on placing the dialysis fistula while he is in the hospital.  He is currently asymptomatic.  No headache.  No chest pain.  No shortness of breath.      Home Medications Prior to Admission medications   Medication Sig Start Date End Date Taking? Authorizing Provider  amLODipine (NORVASC) 10 MG tablet TAKE 1 TABLET (10 MG TOTAL) BY MOUTH DAILY. Patient taking differently: Take 10 mg by mouth daily. 12/24/19 02/17/22 Yes Azzie Glatter, FNP  aspirin 81 MG EC tablet Take 1 tablet (81 mg total) by mouth daily. Swallow whole. 01/18/21  Yes Agarwala, Einar Grad, MD  atorvastatin (LIPITOR) 80 MG tablet Take 1 tablet (80 mg total) by mouth daily. 01/18/21  Yes Agarwala, Einar Grad, MD  carvedilol (COREG) 25 MG tablet TAKE 1 TABLET (25 MG TOTAL) BY MOUTH 2 (TWO) TIMES DAILY. 12/24/19 02/18/23 Yes Azzie Glatter, FNP  cloNIDine (CATAPRES) 0.3 MG tablet  Take 1 tablet (0.3 mg total) by mouth 2 (two) times daily. 01/29/21 05/29/21 Yes Vevelyn Francois, NP  chlorthalidone (HYGROTON) 25 MG tablet Take 1 tablet (25 mg total) by mouth daily. Patient not taking: Reported on 02/17/2021 02/09/21 05/11/21  Skeet Latch, MD      Allergies    Hydralazine    Review of Systems   Review of Systems  Physical Exam Updated Vital Signs BP (!) 183/125    Pulse (!) 56    Temp 98 F (36.7 C)    Resp 13    Ht 5\' 6"  (1.676 m)    Wt 94.3 kg    SpO2 100%    BMI 33.57 kg/m  Physical Exam  ED Results / Procedures / Treatments   Labs (all labs ordered are listed, but only abnormal results are displayed) Labs Reviewed  BASIC METABOLIC PANEL - Abnormal; Notable for the following components:      Result Value   BUN 51 (*)    Creatinine, Ser 6.15 (*)    Calcium 8.6 (*)    GFR, Estimated 10 (*)    All other components within normal limits  CBC - Abnormal; Notable for the following components:   Hemoglobin 11.7 (*)    HCT 36.7 (*)    All other components within normal limits  POCT I-STAT, CHEM 8 - Abnormal; Notable for the  following components:   BUN 46 (*)    Creatinine, Ser 6.50 (*)    Calcium, Ion 1.11 (*)    All other components within normal limits  TROPONIN I (HIGH SENSITIVITY) - Abnormal; Notable for the following components:   Troponin I (High Sensitivity) 48 (*)    All other components within normal limits  RESP PANEL BY RT-PCR (FLU A&B, COVID) [id number]  TROPONIN I (HIGH SENSITIVITY)    EKG EKG Interpretation  Date/Time:  Thursday February 18 2021 08:03:24 EST Ventricular Rate:  58 PR Interval:  217 QRS Duration: 85 QT Interval:  411 QTC Calculation: 404 R Axis:   -15 Text Interpretation: Sinus rhythm Prolonged PR interval Borderline left axis deviation Abnormal inferior Q waves Nonspecific T abnormalities, lateral leads Borderline ST elevation, anterior leads similar to prior EKGs Confirmed by Malvin Johns [id number]) on 02/18/2021 8:14:54  AM  Radiology VAS [place] UPPER EXTREMITY ARTERIAL DUPLEX  Result Date: 02/16/2021  UPPER EXTREMITY DUPLEX STUDY Patient Name:  David [name]  Date of Exam:   02/16/2021 Medical Rec #: [id number]   Accession #:    [id number] Date of Birth: May 03, 1970   Patient Gender: M Patient Age:   48 years Exam Location:  [facility] Procedure:      VAS [place] UPPER EXTREMITY ARTERIAL DUPLEX Referring Phys: Jamelle Haring --------------------------------------------------------------------------------  Indications: Evaluation prior to placement of dialysis access. History:     Patient has a history of Acute renal failure.  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 Performing Technologist: Delorise Shiner RVT  Examination Guidelines: A complete evaluation includes B-mode imaging, spectral Doppler, color Doppler, and power Doppler as needed of all accessible portions of each vessel. Bilateral testing is considered an integral part of a complete examination. Limited examinations for reoccurring indications may be performed as noted.  Right Pre-Dialysis Findings: +-----------------------+----------+--------------------+---------+--------+  Location                PSV (cm/s) Intralum. Diam. (cm) Waveform  Comments  +-----------------------+----------+--------------------+---------+--------+  Brachial Antecub. fossa 68         0.70                 triphasic           +-----------------------+----------+--------------------+---------+--------+  Radial Art at Wrist     49         0.34                 triphasic           +-----------------------+----------+--------------------+---------+--------+  Ulnar Art at Wrist      46         0.24                 triphasic           +-----------------------+----------+--------------------+---------+--------+  Left Pre-Dialysis Findings: +-----------------------+----------+--------------------+---------+--------+  Location                PSV (cm/s) Intralum. Diam. (cm) Waveform  Comments   +-----------------------+----------+--------------------+---------+--------+  Brachial Antecub. fossa 67         0.72                 triphasic           +-----------------------+----------+--------------------+---------+--------+  Radial Art at Wrist     54         0.39                 triphasic           +-----------------------+----------+--------------------+---------+--------+  Ulnar Art at Wrist      42         0.27                 triphasic           +-----------------------+----------+--------------------+---------+--------+  Summary:  Right: No obstruction visualized in the right upper extremity. Left: No obstruction visualized in the left upper extremity. *See table(s) above for measurements and observations. Electronically signed by Jamelle Haring on 02/16/2021 at 3:24:01 PM.    Final    CT Angio Chest/Abd/Pel for Dissection W and/or W/WO  Result Date: 02/18/2021 CLINICAL DATA:  Concern for acute aortic syndrome. EXAM: CT ANGIOGRAPHY CHEST, ABDOMEN AND PELVIS TECHNIQUE: Non-contrast CT of the chest was initially obtained. Multidetector CT imaging through the chest, abdomen and pelvis was performed using the standard protocol during bolus administration of intravenous contrast. Multiplanar reconstructed images and MIPs were obtained and reviewed to evaluate the vascular anatomy. RADIATION DOSE REDUCTION: This exam was performed according to the departmental dose-optimization program which includes automated exposure control, adjustment of the mA and/or kV according to patient size and/or use of iterative reconstruction technique. CONTRAST:  178mL OMNIPAQUE IOHEXOL 350 MG/ML SOLN COMPARISON:  CT abdomen and pelvis dated November 14, 2019. FINDINGS: CTA CHEST FINDINGS Cardiovascular: Normal heart size. No pericardial effusion. Three-vessel coronary artery calcifications. No filling defects of the central pulmonary arteries. Mediastinum/Nodes: Esophagus and thyroid are unremarkable. No pathologically enlarged  lymph nodes seen in the chest. Lungs/Pleura: Central airways are patent. Centrilobular and paraseptal emphysema. Bibasilar atelectasis. No consolidation, pleural effusion or pneumothorax. Musculoskeletal: No chest wall abnormality. No acute or significant osseous findings. Review of the MIP images confirms the above findings. CTA ABDOMEN AND PELVIS FINDINGS VASCULAR Aorta: Moderate atherosclerotic disease of the aorta consisting of calcified and noncalcified plaque. Tricuspid aortic valve with normal caliber aortic root. Dilated ascending thoracic aorta, measuring 4.2 x 4.1 cm at the level of the main pulmonary artery. Remainder of the thoracic aorta is normal in caliber. Major arch vessels are widely patent. Normal caliber suprarenal abdominal aorta. 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 Previously measured 5.6 x 5.2 cm on prior CT of the abdomen and pelvis dated December 04, 2019. Celiac: Normal in caliber with no significant stenosis. SMA: Normal in caliber with no significant stenosis. Renals: Normal in caliber with no significant stenosis. IMA: Occluded at the origin with distal reconstitution of flow via collaterals. Inflow: Dilated bilateral common iliac arteries. Right common iliac artery measures 2.0 x 2.0 cm. Left common iliac artery measures 2.6 x 2.6 cm. Multifocal moderate to severe narrowing of the bilateral internal iliac arteries. Bilateral external iliac arteries normal in caliber with moderate atherosclerotic disease causing mild multifocal narrowing. Veins: No obvious venous abnormality within the limitations of this arterial phase study. Review of the MIP images confirms the above findings. NON-VASCULAR Hepatobiliary: No focal liver abnormality is seen. No gallstones, gallbladder wall thickening, or biliary dilatation. Pancreas: Unremarkable. No pancreatic  ductal dilatation or surrounding inflammatory changes. Spleen: Normal in size without focal abnormality. Adrenals/Urinary Tract: Adrenal glands are unremarkable. Kidneys are normal, without renal calculi, focal lesion, or hydronephrosis. Bladder is unremarkable. Stomach/Bowel: Stomach is within normal limits. Appendix appears normal. No evidence of bowel wall thickening, distention, or inflammatory changes. Lymphatic: No enlarged lymph nodes in the abdomen or pelvis. Reproductive: Prostate is unremarkable. Other: No abdominal wall hernia or  abnormality. No abdominopelvic ascites. Musculoskeletal: No acute or significant osseous findings. Review of the MIP images confirms the above findings. IMPRESSION: 1. Fusiform infrarenal abdominal aortic aneurysm measuring 6.3 cm in greatest axial dimension, previously measured up to 5.6 cm on prior 2021 exam. No evidence of impending rupture. Recommend referral to a vascular specialist. This recommendation follows ACR consensus guidelines: White Paper of the ACR Incidental Findings Committee II on Vascular Findings. J Am Coll Radiol 2013; 10:789-794. 2. Dilated bilateral common iliac arteries measuring up to 2.0 cm on the right and 2.6 cm on the left. 3. 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 Recommend annual imaging followup by CTA or MRA. This recommendation follows 2010 ACCF/AHA/AATS/ACR/ASA/SCA/SCAI/SIR/STS/SVM Guidelines for the Diagnosis and Management of Patients with Thoracic Aortic Disease. Circulation. 2010; 121: T035-W656. Aortic aneurysm NOS (ICD10-I71.9) 4. Aortic Atherosclerosis (ICD10-I70.0) and Emphysema (ICD10-J43.9). Electronically Signed   By: Yetta Glassman M.D.   On: 02/18/2021 09:45   VAS [place] UPPER EXTREMITY VEIN MAPPING  Result Date: 02/16/2021 UPPER EXTREMITY VEIN MAPPING Patient Name:  David [name]  Date of Exam:   02/16/2021 Medical Rec #: [id number]   Accession #:    [id number] Date of Birth: Jul 11, 1970   Patient Gender: M Patient Age:    51 years Exam Location:  [facility] Procedure:      VAS [place] UPPER EXT VEIN MAPPING (PRE-OP AVF) Referring Phys: Jamelle Haring --------------------------------------------------------------------------------  Indications: Pre-access. History: Evaluation prior to placement of dialysis access.  Performing Technologist: Delorise Shiner RVT  Examination Guidelines: A complete evaluation includes B-mode imaging, spectral Doppler, color Doppler, and power Doppler as needed of all accessible portions of each vessel. Bilateral testing is considered an integral part of a complete examination. Limited examinations for reoccurring indications may be performed as noted. +-----------------+-------------+----------+--------+  Right Cephalic    Diameter (cm) Depth (cm) Findings  +-----------------+-------------+----------+--------+  Shoulder              0.39                           +-----------------+-------------+----------+--------+  Prox upper arm        0.42                           +-----------------+-------------+----------+--------+  Mid upper arm         0.34                           +-----------------+-------------+----------+--------+  Dist upper arm        0.28                           +-----------------+-------------+----------+--------+  Antecubital fossa     0.38                           +-----------------+-------------+----------+--------+  Prox forearm          0.42                           +-----------------+-------------+----------+--------+  Mid forearm           0.42                           +-----------------+-------------+----------+--------+  Dist forearm          0.44                           +-----------------+-------------+----------+--------+ +-----------------+-------------+----------+--------+  Right Basilic     Diameter (cm) Depth (cm) Findings  +-----------------+-------------+----------+--------+  Prox upper arm        0.62                            +-----------------+-------------+----------+--------+  Mid upper arm         0.52                           +-----------------+-------------+----------+--------+  Dist upper arm        0.56                           +-----------------+-------------+----------+--------+  Antecubital fossa     0.48                           +-----------------+-------------+----------+--------+  Prox forearm          0.45                           +-----------------+-------------+----------+--------+ +-----------------+-------------+----------+--------+  Left Cephalic     Diameter (cm) Depth (cm) Findings  +-----------------+-------------+----------+--------+  Shoulder              0.26                           +-----------------+-------------+----------+--------+  Prox upper arm        0.26                           +-----------------+-------------+----------+--------+  Mid upper arm         0.25                           +-----------------+-------------+----------+--------+  Dist upper arm        0.25                           +-----------------+-------------+----------+--------+  Antecubital fossa     0.32                           +-----------------+-------------+----------+--------+  Prox forearm          0.27                           +-----------------+-------------+----------+--------+  Mid forearm           0.25                           +-----------------+-------------+----------+--------+  Dist forearm          0.34                           +-----------------+-------------+----------+--------+ +-----------------+-------------+----------+--------+  Left Basilic      Diameter (cm) Depth (cm) Findings  +-----------------+-------------+----------+--------+  Prox upper arm  0.62                           +-----------------+-------------+----------+--------+  Mid upper arm         0.52                           +-----------------+-------------+----------+--------+  Dist upper arm        0.56                            +-----------------+-------------+----------+--------+  Antecubital fossa     0.48                           +-----------------+-------------+----------+--------+  Prox forearm          0.45                           +-----------------+-------------+----------+--------+ Summary: Right: Patent basilic and cephalic veins with diameters as described        above. Left: Patent basilic and cephalic veins with diameters as described       above. *See table(s) above for measurements and observations.  Diagnosing physician: Jamelle Haring Electronically signed by Jamelle Haring on 02/16/2021 at 3:55:25 PM.    Final     Procedures Procedures    Medications Ordered in ED Medications  chlorhexidine (PERIDEX) 0.12 % solution (has no administration in time range)  carvedilol (COREG) tablet 25 mg (25 mg Oral Given 02/18/21 0642)  iohexol (OMNIPAQUE) 350 MG/ML injection 100 mL (100 mLs Intravenous Contrast Given 02/18/21 0901)    ED Course/ Medical Decision Making/ A&P                           Medical Decision Making Problems Addressed: Abdominal aortic aneurysm (AAA) without rupture, unspecified part: chronic illness or injury Elevated troponin: acute illness or injury ESRD (end stage renal disease) ([place]): chronic illness or injury Hypertensive urgency: acute illness or injury that poses a threat to life or bodily functions  Amount and/or Complexity of Data Reviewed Independent Historian:     Details: wife External Data Reviewed: labs, radiology, ECG and notes. Labs: ordered. Decision-making details documented in ED Course. Radiology: ordered and independent interpretation performed. ECG/medicine tests: ordered and independent interpretation performed. Decision-making details documented in ED Course. Discussion of management or test interpretation with external provider(s): IM  Risk Decision regarding hospitalization.   Patient is a 51 year old male who has end-stage renal disease and was seen for  dialysis fistula placement and noted to be hypertensive.  He was given Coreg and his blood pressure has improved.  He is completely asymptomatic.  CT scan of his chest abdomen pelvis was performed to assess for changing aneurysm.  His infrarenal aneurysm appears to be stable.  There is some dilatation of his thoracic aorta.  No evidence of rupture or dissection.  Vascular surgery is following the patient.  His labs are nonconcerning.  His creatinine is elevated as expected.  His potassium is normal.  He has no suggestions of fluid overload or need for emergent dialysis.  He does not have any concerns for new stroke.  No suggestions of ACS.  His EKG does not show any ischemic changes.  His troponin is mildly elevated which I suspect is from his renal  disease.  I consulted the internal medicine teaching service who will admit the patient for further treatment.  Final Clinical Impression(s) / ED Diagnoses Final diagnoses:  Hypertensive urgency  Abdominal aortic aneurysm (AAA) without rupture, unspecified part  ESRD (end stage renal disease) (HCC)  Elevated troponin    Rx / DC Orders ED Discharge Orders     None         Malvin Johns, MD 02/18/21 1025

## 2021-02-18 NOTE — Consult Note (Signed)
[facility] [place]  ASSESSMENT / PLAN: 51 y.o. male with hypertensive urgency in the preoperative area.  The patient has chronic kidney disease, stage V.  I suspect he will need to start dialysis soon, if not this admission.  He has a 61 mm infrarenal abdominal aortic aneurysm.  His blood pressure is too high for surgery today.  The anesthesia team has recommended he proceed to the emergency room.  I will reschedule dialysis access surgery (right brachiocephalic fistula and tunneled dialysis catheter) for tomorrow 02/19/21.  He will need a CT angiogram of his chest, abdomen, and pelvis.  Assuming no acute aortic pathology, I will plan for endovascular aortic repair on Monday 02/22/2021.  Recommend evaluation by internal medicine team and nephrology.  We will follow along closely.  CHIEF COMPLAINT: Preoperative testing  HISTORY OF PRESENT ILLNESS: David [name] is a 51 y.o. male with COVID-19 infection, CKD 4, incidental note of abdominal aortic aneurysm during recent ER visit.  He is originally from the [place] and works as a Geophysicist/field seismologist.  He speaks [place], but does not seem to understand some of what I am saying to him.  His wife is along with him and seems to speak better [place].  He has severe hypertension, and is on a 5 drug regimen for this.  He has no knowledge of his severe renal insufficiency, and was surprised when I mentioned it in the office today.  He has no abdominal pain.   02/16/21: Patient presents to clinic for evaluation after recent admission for hypertensive emergency.  He was found to have chronic lacunar strokes and microhemorrhages with a small right lacunar infarct.  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  Abdominal ultrasound showed interval growth of his abdominal aortic aneurysm to 6.1 cm.  He is at the threshold for repair.  Unfortunately his GFR has deteriorated significantly.  Dr. Augustin Coupe saw him 01/17/2021, and was concerned that he would  progress to end-stage renal disease.  02/18/21: Patient presents for dialysis access creation.  His systolic blood pressure is 230.  He is resting comfortably in bed.  Both I and the anesthesia team feel his blood pressure is too high to proceed safely with surgery.  Past Medical History:  Diagnosis Date   AAA (abdominal aortic aneurysm) 11/2019   Chronic kidney disease    Hypertension    Proteinuria 12/2019   Vitamin D deficiency     Past Surgical History:  Procedure Laterality Date   none      Family History  Problem Relation Age of Onset   Hypertension Mother    Hypertension Father    Hypertension Brother     Social History   Socioeconomic History   Marital status: Married    Spouse name: Not on file   Number of children: 5   Years of education: Not on file   Highest education level: Not on file  Occupational History   Occupation: truck driver  Tobacco Use   Smoking status: Former    Types: Cigarettes   Smokeless tobacco: Never  Vaping Use   Vaping Use: Never used  Substance and Sexual Activity   Alcohol use: No   Drug use: No   Sexual activity: Not on file  Other Topics Concern   Not on file  Social History Narrative   Not on file   Social Determinants of Health   Financial Resource Strain: Low Risk    Difficulty of Paying Living Expenses: Not hard at all  Food Insecurity: No Food Insecurity   Worried About Charity fundraiser in the Last Year: Never true   Ran Out of Food in the Last Year: Never true  Transportation Needs: No Transportation Needs   Lack of Transportation (Medical): No   Lack of Transportation (Non-Medical): No  Physical Activity: Insufficiently Active   Days of Exercise per Week: 3 days   Minutes of Exercise per Session: 30 min  Stress: Not on file  Social Connections: Not on file  Intimate Partner Violence: Not on file    Allergies  Allergen Reactions   Hydralazine     Dizziness, abnormal sensation, nauseous    Current  Facility-Administered Medications  Medication Dose Route Frequency Provider Last Rate Last Admin   carvedilol (COREG) 12.5 MG tablet            ceFAZolin (ANCEF) 2-4 GM/100ML-% IVPB            chlorhexidine (PERIDEX) 0.12 % solution             REVIEW OF SYSTEMS:  [X]  denotes positive finding, [ ]  denotes negative finding Cardiac  Comments:  Chest pain or chest pressure:    Shortness of breath upon exertion:    Short of breath when lying flat:    Irregular heart rhythm:        Vascular    Pain in calf, thigh, or hip brought on by ambulation:    Pain in feet at night that wakes you up from your sleep:     Blood clot in your veins:    Leg swelling:         Pulmonary    Oxygen at home:    Productive cough:     Wheezing:         Neurologic    Sudden weakness in arms or legs:     Sudden numbness in arms or legs:     Sudden onset of difficulty speaking or slurred speech:    Temporary loss of vision in one eye:     Problems with dizziness:         Gastrointestinal    Blood in stool:     Vomited blood:         Genitourinary    Burning when urinating:     Blood in urine:        Psychiatric    Major depression:         Hematologic    Bleeding problems:    Problems with blood clotting too easily:        Skin    Rashes or ulcers:        Constitutional    Fever or chills:      PHYSICAL EXAM Vitals:   02/18/21 0628  BP: (!) 221/140  Pulse: 61  Resp: 17  Temp: 97.7 F (36.5 C)  TempSrc: Oral  SpO2: 95%  Weight: 94.3 kg  Height: 5\' 6"  (1.676 m)   Constitutional: well appearing. no distress. Appears well nourished.  Neurologic: CN intact. no focal findings. no sensory loss. Psychiatric: Mood and affect symmetric and appropriate. Eyes: No icterus. No conjunctival pallor. Ears, nose, throat: mucous membranes moist. Midline trachea.  Cardiac: regular rate and rhythm.  Respiratory: unlabored. Abdominal: soft, non-tender, non-distended.  Peripheral vascular: 2+  radial pulses Extremity: no edema. no cyanosis. no pallor.  Skin: no gangrene. no ulceration.  Lymphatic: no Stemmer's sign. no palpable lymphadenopathy.  PERTINENT LABORATORY AND RADIOLOGIC DATA  Most recent CBC CBC Latest  Ref Rng & Units 02/18/2021 01/18/2021 01/17/2021  WBC 4.0 - 10.5 K/uL - 4.3 3.9(L)  Hemoglobin 13.0 - 17.0 g/dL 13.3 11.5(L) 12.8(L)  Hematocrit 39.0 - 52.0 % 39.0 36.1(L) 40.0  Platelets 150 - 400 K/uL - 207 230     Most recent CMP CMP Latest Ref Rng & Units 02/18/2021 01/18/2021 01/17/2021  Glucose 70 - 99 mg/dL 90 91 104(H)  BUN 6 - 20 mg/dL 46(H) 45(H) 41(H)  Creatinine 0.61 - 1.24 mg/dL 6.50(H) 6.40(H) 6.53(H)  Sodium 135 - 145 mmol/L 139 135 134(L)  Potassium 3.5 - 5.1 mmol/L 3.9 3.9 3.8  Chloride 98 - 111 mmol/L 103 97(L) 101  CO2 22 - 32 mmol/L - 27 23  Calcium 8.9 - 10.3 mg/dL - 8.8(L) 9.0  Total Protein 6.5 - 8.1 g/dL - - -  Total Bilirubin 0.3 - 1.2 mg/dL - - -  Alkaline Phos 38 - 126 U/L - - -  AST 15 - 41 U/L - - -  ALT 0 - 44 U/L - - -    Renal function Estimated Creatinine Clearance: 14.5 mL/min (A) (by C-G formula based on SCr of 6.5 mg/dL (H)).  Hgb A1c MFr Bld (%)  Date Value  01/15/2021 5.5    LDL Chol Calc (NIH)  Date Value Ref Range Status  12/24/2019 135 (H) 0 - 99 mg/dL Final   LDL Cholesterol  Date Value Ref Range Status  01/15/2021 253 (H) 0 - 99 mg/dL Final    Comment:           Total Cholesterol/HDL:CHD Risk Coronary Heart Disease Risk Table                     Men   Women  1/2 Average Risk   3.4   3.3  Average Risk       5.0   4.4  2 X Average Risk   9.6   7.1  3 X Average Risk  23.4   11.0        Use the calculated Patient Ratio above and the CHD Risk Table to determine the patient's CHD Risk.        ATP III CLASSIFICATION (LDL):  <100     mg/dL   Optimal  100-129  mg/dL   Near or Above                    Optimal  130-159  mg/dL   Borderline  160-189  mg/dL   High  >190     mg/dL   Very High Performed at [facility] [address], [place], [place] [address]    Direct LDL  Date Value Ref Range Status  01/15/2021 257.5 (H) 0 - 99 mg/dL Final    Comment:    Performed at [facility] [address], [place], [place] [address]    Yevonne Aline. Stanford Breed, MD [facility] Phone Number: [phone] 02/18/2021 7:09 AM  Total time spent on preparing this encounter including chart review, data review, collecting history, examining the patient, coordinating care for this established patient, 40 minutes.  Portions of this report may have been transcribed using voice recognition software.  Every effort has been made to ensure accuracy; however, inadvertent computerized transcription errors may still be present.

## 2021-02-19 ENCOUNTER — Encounter (HOSPITAL_COMMUNITY): Payer: Self-pay | Admitting: Certified Registered Nurse Anesthetist

## 2021-02-19 ENCOUNTER — Encounter (HOSPITAL_COMMUNITY): Payer: BC Managed Care – PPO

## 2021-02-19 ENCOUNTER — Encounter: Payer: Self-pay | Admitting: Family Medicine

## 2021-02-19 ENCOUNTER — Encounter (HOSPITAL_COMMUNITY)
Admission: RE | Disposition: A | Discharge status: Home / Self Care | Payer: Self-pay | Source: Ambulatory Visit | Attending: Internal Medicine

## 2021-02-19 DIAGNOSIS — I160 Hypertensive urgency: Secondary | ICD-10-CM

## 2021-02-19 DIAGNOSIS — N185 Chronic kidney disease, stage 5: Secondary | ICD-10-CM

## 2021-02-19 DIAGNOSIS — I712 Thoracic aortic aneurysm, without rupture, unspecified: Secondary | ICD-10-CM

## 2021-02-19 DIAGNOSIS — I722 Aneurysm of renal artery: Secondary | ICD-10-CM

## 2021-02-19 LAB — TSH: TSH: 1.79 u[IU]/mL (ref 0.450–4.500)

## 2021-02-19 LAB — BASIC METABOLIC PANEL
Anion gap: 13 (ref 5–15)
BUN/Creatinine Ratio: 8 — ABNORMAL LOW (ref 9–20)
BUN: 45 mg/dL — ABNORMAL HIGH (ref 6–24)
BUN: 47 mg/dL — ABNORMAL HIGH (ref 6–20)
CO2: 23 mmol/L (ref 20–29)
CO2: 21 mmol/L — ABNORMAL LOW (ref 22–32)
Calcium: 9.1 mg/dL (ref 8.7–10.2)
Calcium: 8.9 mg/dL (ref 8.9–10.3)
Chloride: 97 mmol/L (ref 96–106)
Chloride: 103 mmol/L (ref 98–111)
Creatinine, Ser: 5.45 mg/dL — ABNORMAL HIGH (ref 0.76–1.27)
Creatinine, Ser: 5.84 mg/dL — ABNORMAL HIGH (ref 0.61–1.24)
GFR, Estimated: 11 mL/min — ABNORMAL LOW (ref >60)
Glucose, Bld: 100 mg/dL — ABNORMAL HIGH (ref 70–99)
Glucose: 81 mg/dL (ref 70–99)
Potassium: 4.7 mmol/L (ref 3.5–5.2)
Potassium: 3.6 mmol/L (ref 3.5–5.1)
Sodium: 137 mmol/L (ref 134–144)
Sodium: 137 mmol/L (ref 135–145)
eGFR: 12 mL/min/{1.73_m2} — ABNORMAL LOW (ref >59)

## 2021-02-19 LAB — CBC
HCT: 35.6 % — ABNORMAL LOW (ref 39.0–52.0)
Hemoglobin: 11.2 g/dL — ABNORMAL LOW (ref 13.0–17.0)
MCH: 26.5 pg (ref 26.0–34.0)
MCHC: 31.5 g/dL (ref 30.0–36.0)
MCV: 84.2 fL (ref 80.0–100.0)
Platelets: 231 10*3/uL (ref 150–400)
RBC: 4.23 MIL/uL (ref 4.22–5.81)
RDW: 14 % (ref 11.5–15.5)
WBC: 3.7 10*3/uL — ABNORMAL LOW (ref 4.0–10.5)
nRBC: 0 % (ref 0.0–0.2)

## 2021-02-19 SURGERY — ARTERIOVENOUS (AV) FISTULA CREATION
Anesthesia: Monitor Anesthesia Care | Laterality: Right

## 2021-02-19 MED ORDER — ACETAMINOPHEN 500 MG PO TABS: 1000.0000 mg | ORAL_TABLET | Freq: Once | ORAL | Status: DC

## 2021-02-19 MED ORDER — SODIUM CHLORIDE 0.9 % IV SOLN: INTRAVENOUS | Status: DC

## 2021-02-19 MED ORDER — INFLUENZA VAC SPLIT QUAD 0.5 ML IM SUSY
0.5000 mL | PREFILLED_SYRINGE | INTRAMUSCULAR | Status: AC
  Administered 2021-02-20: 0.5 mL via INTRAMUSCULAR
  Filled 2021-02-19: qty 0.5

## 2021-02-19 MED ORDER — CEFAZOLIN SODIUM-DEXTROSE 2-4 GM/100ML-% IV SOLN
2.0000 g | INTRAVENOUS | Status: DC
  Filled 2021-02-19: qty 100

## 2021-02-19 MED ORDER — CEFAZOLIN SODIUM-DEXTROSE 2-4 GM/100ML-% IV SOLN
INTRAVENOUS | Status: AC
  Filled 2021-02-19: qty 100

## 2021-02-19 MED ORDER — CHLORHEXIDINE GLUCONATE 4 % EX LIQD
60.0000 mL | Freq: Once | CUTANEOUS | Status: DC
  Filled 2021-02-19: qty 60

## 2021-02-19 MED ORDER — CHLORHEXIDINE GLUCONATE 0.12 % MT SOLN: 15.0000 mL | Freq: Once | OROMUCOSAL | Status: DC

## 2021-02-19 MED ORDER — ORAL CARE MOUTH RINSE: 15.0000 mL | Freq: Once | OROMUCOSAL | Status: DC

## 2021-02-19 MED ORDER — CHLORHEXIDINE GLUCONATE 0.12 % MT SOLN
OROMUCOSAL | Status: AC
  Filled 2021-02-19: qty 15

## 2021-02-19 NOTE — Progress Notes (Addendum)
Date: 02/19/2021  Patient name: David [name]  Medical record number: [id number]  Date of birth: 1970-12-04   I have seen and evaluated David [name] and discussed their care with the Residency Team.  In brief, patient is a 51 year old male with past medical history of uncontrolled hypertension, AAA and CKD stage V who presented to the ED with hypertensive urgency from the preop area.  Patient was scheduled to have an aVF done by Dr. Standley Dakins and presented to preop for this procedure.  At that time he was noted to have severely elevated systolic blood pressures in the 230s and was referred to the ED for further management.  Patient did not take his blood pressure medications the morning prior to his procedure as he was instructed not take anything by mouth.  At home, patient states that his systolic blood pressures range in the 140s to 150s range.  No headache, no chest pain, no shortness of breath, no palpitations, no lightheadedness, no syncope, no focal weakness, no tingling or numbness, no nausea or vomiting, no abdominal pain, no diarrhea, no fevers or chills.  Patient did have recent admission to the ICU for hypertensive emergency and PRES last month.  Today, patient states that he feels well and denies any new complaints.  We explained to the patient that he was scheduled to have his AVF placed today and patient did not appear to have a clear understanding of what dialysis was.  I explained to him that dialysis was a lifelong procedure and that he would need to do dialysis 3 times a week for the rest of his life.  At that time patient became upset and stated that he did not know that it would be for the rest of his life and that he does not want the procedure done.  He said that he has lifts over 61 and is seeing his children grow up and has seen his grandchildren and that he would like to go home to his country and die there rather than getting dialysis.  PMHx, Fam Hx, and/or Soc Hx : As per resident  admit note  Vitals:   02/19/21 0524 02/19/21 0800  BP: (!) 148/114 (!) 157/131  Pulse:  66  Resp:  16  Temp:  97.7 F (36.5 C)  SpO2:  98%   General: Awake, alert, oriented x3, NAD CVS: Regular rate and rhythm, grade 3 out of 6 systolic murmur noted Lungs: CTA bilaterally Abdomen: Soft, nontender, nondistended, no active bowel sounds Extremities: No edema noted, nontender to palpation Psych: Normal mood and affect HEENT: Normocephalic, atraumatic Skin: Warm and dry  Assessment and Plan: I have seen and evaluated the patient as outlined above. I agree with the formulated Assessment and Plan as detailed in the residents' note, with the following changes:   1.  Hypertensive urgency: -Patient presented to the ED from the preop area for severely uncontrolled hypertension after holding his medication in the morning prior to his proposed aVF procedure.  I suspect that the hypertensive urgency was secondary to rebound hypertension from missing his clonidine dose.  However, patient also has a history of recent admission for hypertensive emergency with PRES and it is uncertain if his blood pressures are as well controlled as he says it is at home. -SBPs have improved today to the 140s to 150s -We will continue with home blood pressure medications including amlodipine 10 mg, carvedilol 25 mg twice daily, chlorthalidone 25 mg and clonidine 0.3 mg twice daily. -Patient was started on  nitroglycerin drip yesterday given severely elevated blood pressures.  We will try and wean this off today. -Of note, patient's echocardiogram showed moderate-severe LVH with a speckled pattern concerning for possible underlying amyloid.  We will discuss this with cardiology. -We will continue to monitor closely.  No further work-up at this time  2.  CKD stage V: -Patient was scheduled to have an AV fistula placed today for possible initiation of hemodialysis as an outpatient -However, patient was upset on during the  hemodialysis would be for the rest of his life and that he would need to do this 3 times a week.  He adamantly refused this procedure. -Nephrology follow-up and recommendations appreciated.  Given that patient is refusing hemodialysis now there is no indication for aVF/TDC to be placed and this procedure was canceled -We will continue to monitor BMP closely -No further work-up for now  3.  Infrarenal aortic aneurysm: -Patient was noted to have increasing size of the infrarenal aortic aneurysm up to 6.3 cm from 5.6 cm on previous imaging. -Vascular surgery follow-up recommendations appreciated -Patient scheduled to have EVAR on Monday February 13th. -No further work-up at this time  Aldine Contes, MD 2/10/202312:23 PM

## 2021-02-19 NOTE — Progress Notes (Signed)
Nitroglycerin gtt weaned off at 1300hrs for SBP less than 159mmhg.  Will continue to monitor.

## 2021-02-19 NOTE — Progress Notes (Signed)
°  Transition of Care [facility]) Screening Note   Patient Details  Name: David [name] Date of Birth: 1970-08-24   [facility] CM/SW Contact:    Milas Gain, [place] Phone Number: 02/19/2021, 2:55 PM    Transition of Care Department [facility]) has reviewed patient and no TOC needs have been identified at this time. We will continue to monitor patient advancement through interdisciplinary progression rounds. If new patient transition needs arise, please place a TOC consult.

## 2021-02-19 NOTE — Consult Note (Addendum)
Nephrology Consult   Requesting provider: Dr. Dareen Piano Service requesting consult: Internal Medicine Reason for consult: Severe hypertension in patient with CKD stage 5  Assessment/Recommendations: David [name] is a/an 51 y.o. 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   Last seen by outpatient nephrology 07/28/2020; saw Dr. Marthenia Rolling of [facility]. At that time, was considered CKD 4 and provider was concerned about impending advancement to CKD 5 and medication non-compliance. He saw [facility] 02/12/21 - Dr. Joelyn Oms.  Saw [facility] Dr. Stanford Breed 02/16/21 for hospital follow up (s/p hypertensive emergency and PRES 01/2021), who noted patient now in CKD 5 and planned for right AVF and [facility] creation. Dr. Standley Dakins indicated he planned for CTA abd and AAA repair after patient initiated HD.   Patient presented initially to hospital for pre-op clearance by [facility] for scheduled AVF and TDC placement, but was found to be severely hypertensive; he was directed to the ED for BP management and subsequently admitted. [facility] surgery for AVF and TDC currently on hold until after AAA repair scheduled for Monday 2/13. Nephrology consulted for severe hypertension in patient with CKD stage 5 and to determine if HD via central line is warranted during this admission.   CKD Stage 5 Previous provider notes indicate concern for patient's limited understanding of his disease state and progression.  CTA chest/abd/pelvis yesterday shows normal kidneys without lesion of hydronephrosis. Creatinine baseline (from available 2022 data in [place]) appears to range 3.40-4.04, had progressed to 6.03-6.53 in January 2023 while hospitalized for hypertensive emergency and PRES. Creatinine 5.84, BUN 47 today.  -No outputs recorded from this admission, ordered strict I/Os -Weight today 91 kg, down from 94.3 kg yesterday (? Scale differences) -Continue to monitor daily Cr, Dose meds for GFR -Monitor Daily I/Os, Daily weight  -Maintain MAP>65 for optimal renal perfusion.  -Avoid nephrotoxic medications including NSAIDs and Vanc/Zosyn combo -Currently no indication for emergent HD today, will continue to evaluate daily - [name] relayed today that he does NOT desire HD to prolong his life. He did not understand that HD would be 3 times weekly for life; given this revelation, he declines. No indication for AVF+TDC to be scheduled.   Volume Status: Appears euvolemic on exam.   Hypertension: Improved control with current regimen. HTN acceptable from renal stand point, however not ideal for AAA. Defer to [facility] for BP goals. Expect goal <130/<80. Maintain MAP>65 for optimal renal perfusion.   Anemia due to CKD: Hgb 11.2 today. Unclear baseline.  -Transfuse for Hgb<7 g/dL -No role for ESA in this setting  Recommendations conveyed to primary service.   Ezequiel Essex, MD Family Medicine PGY-2 Nephrology Service [facility] 02/19/2021 11:14 AM  ____________________________________________________________________________________ CC: None  History of Present Illness: Feels well this morning. No nausea or vomiting. No headache, vision changes, swelling, or malaise. He relayed that he thought he was coming to the hospital to have his "blood cleaned once or twice" then released. He did not understand that HD involves treatments 3 times weekly for life. He says that he does NOT want HD.  He was okay  with it only when he thought it would once or twice then done. He does not want to "waste my life coming to the hospital 3 times a week". He says he "is not scared of death" and has people who depend on him.   Medications:  Current Facility-Administered Medications  Medication Dose Route Frequency Provider Last Rate Last Admin   0.9 %  sodium chloride infusion   Intravenous Continuous Cherre Robins, MD       0.9 %  sodium chloride infusion   Intravenous Continuous Santa Lighter, MD       acetaminophen (TYLENOL) tablet 650 mg  650 mg Oral Q6H PRN Mitzi Hansen, MD       Or   acetaminophen (TYLENOL) suppository 650 mg  650 mg Rectal Q6H PRN Darrick Meigs, Rylee, MD       acetaminophen (TYLENOL) tablet 1,000 mg  1,000 mg Oral Once Santa Lighter, MD       amLODipine (NORVASC) tablet 10 mg  10 mg Oral Daily Christian, Rylee, MD   10 mg at 02/19/21 0844   atorvastatin (LIPITOR) tablet 80 mg  80 mg Oral Daily Christian, Rylee, MD   80 mg at 02/19/21 0844   carvedilol (COREG) tablet 25 mg  25 mg Oral BID Christian, Rylee, MD   25 mg at 02/19/21 0845   ceFAZolin (ANCEF) 2-4 GM/100ML-% IVPB            ceFAZolin (ANCEF) IVPB 2g/100 mL premix  2 g Intravenous 30 min Pre-Op Cherre Robins, MD       chlorhexidine (HIBICLENS) 4 % liquid 4 application  60 mL Topical Once Cherre Robins, MD       And   [START ON 02/20/2021] chlorhexidine (HIBICLENS) 4 % liquid 4 application  60 mL Topical Once Cherre Robins, MD       chlorhexidine (PERIDEX) 0.12 % solution 15 mL  15 mL Mouth/Throat Once Santa Lighter, MD       Or   MEDLINE mouth rinse  15 mL Mouth Rinse Once Santa Lighter, MD       chlorhexidine (PERIDEX) 0.12 % solution            chlorthalidone (HYGROTON) tablet 25 mg  25 mg Oral Daily Christian, Rylee, MD   25 mg at 02/19/21 0844   cloNIDine (CATAPRES) tablet 0.3 mg  0.3 mg Oral BID Christian, Rylee, MD   0.3 mg at 02/19/21 0844   heparin injection 5,000 Units  5,000 Units Subcutaneous Q8H  Christian, Rylee, MD   5,000 Units at 02/19/21 0851   [START ON 02/20/2021] influenza vac split quadrivalent PF (FLUARIX) injection 0.5 mL  0.5 mL Intramuscular Tomorrow-1000 Aldine Contes, MD       nitroGLYCERIN 50 mg in dextrose 5 % 250 mL (0.2 mg/mL) infusion  0-200 mcg/min Intravenous Titrated Rick Duff, MD 3 mL/hr at 02/19/21 0453 10 mcg/min at 02/19/21 0453     ALLERGIES Hydralazine  MEDICAL HISTORY Past Medical History:  Diagnosis Date   AAA (abdominal aortic aneurysm) 11/2019   Chronic kidney disease    Hypertension    Proteinuria 12/2019   Vitamin D deficiency      SOCIAL HISTORY Social History   Socioeconomic History   Marital status: Married    Spouse name: Not on file   Number of children: 5   Years of education: Not on file   Highest education level: Not on file  Occupational History  Occupation: truck Geophysicist/field seismologist  Tobacco Use   Smoking status: Former    Types: Cigarettes   Smokeless tobacco: Never  Scientific laboratory technician Use: Never used  Substance and Sexual Activity   Alcohol use: No   Drug use: No   Sexual activity: Not on file  Other Topics Concern   Not on file  Social History Narrative   Not on file   Social Determinants of Health   Financial Resource Strain: Low Risk    Difficulty of Paying Living Expenses: Not hard at all  Food Insecurity: No Food Insecurity   Worried About Charity fundraiser in the Last Year: Never true   [place] in the Last Year: Never true  Transportation Needs: No Transportation Needs   Lack of Transportation (Medical): No   Lack of Transportation (Non-Medical): No  Physical Activity: Insufficiently Active   Days of Exercise per Week: 3 days   Minutes of Exercise per Session: 30 min  Stress: Not on file  Social Connections: Not on file  Intimate Partner Violence: Not on file     FAMILY HISTORY Family History  Problem Relation Age of Onset   Hypertension Mother    Hypertension Father     Hypertension Brother    No family history of ESRD  Review of Systems: 12 systems reviewed Otherwise as per HPI, all other systems reviewed and negative  Physical Exam: Vitals:   02/19/21 0524 02/19/21 0800  BP: (!) 148/114 (!) 157/131  Pulse:  66  Resp:  16  Temp:  97.7 F (36.5 C)  SpO2:  98%   No intake/output data recorded.  Intake/Output Summary (Last 24 hours) at 02/19/2021 1114 Last data filed at 02/19/2021 0500 Gross per 24 hour  Intake 51.19 ml  Output --  Net 51.19 ml   General: well-appearing, no acute distress HEENT: anicteric sclera, oropharynx clear without lesions CV: regular rate, normal rhythm, no murmurs, no gallops, no rubs, no peripheral edema Lungs: clear to auscultation bilaterally, normal work of breathing Abd: soft, non-tender, non-distended Skin: no visible lesions or rashes Psych: alert, engaged, appropriate mood and affect Musculoskeletal: no obvious deformities Neuro: normal speech, no gross focal deficits   Test Results Reviewed Lab Results  Component Value Date   NA 137 02/19/2021   K 3.6 02/19/2021   CL 103 02/19/2021   CO2 21 (L) 02/19/2021   BUN 47 (H) 02/19/2021   CREATININE 5.84 (H) 02/19/2021   CALCIUM 8.9 02/19/2021   ALBUMIN 3.8 01/14/2021   PHOS 5.2 (H) 01/18/2021    ---------------------  Attending addendum:   Seen and examined independently.  Agree with note and exam as documented above by Dr. Jeani Hawking and as noted here.  Mr. Mcneish is a 51 year old gentleman with a history of CKD stage V, hypertension, AAA, and tobacco abuse who presented to the hospital for scheduled AVF placement.  He was found to have hypertensive urgency and was redirected to the ER.  His AAA was noted to be increasing in size and this is scheduled to be repaired on Monday.  Per charting he was also to have a tunneled catheter placed (Note nephrology notes do not indicate knowledge of plans for tunneled catheter and he has no current indication for  dialysis).  He last saw Dr. Joelyn Oms on 02/12/2021 after being lost to follow-up for 2 years.  It appears he saw an outside practice in the interim.  He did not realize earlier that dialysis would be 3 times  a week for the rest of his life.  He thought it would just be a couple of times in total.  He does not want to "waste his life" like that and understands that if he does progress to end-stage renal disease that he would either need dialysis or need to prepare for transition to comfort measures only.  He tells me that would rather die than do dialysis.  He feels as though be a waste of his life.  I asked him his preferred language prior to our conversation and he stated [place]; his wife joined via phone.  No family history of CKD  General adult male in bed in no acute distress HEENT normocephalic atraumatic extraocular movements intact sclera anicteric Neck supple trachea midline Lungs clear to auscultation bilaterally normal work of breathing at rest  Heart S1S2; no rub Abdomen soft nontender nondistended Extremities no edema  Psych normal mood and affect Neuro alert and oriented x 3 provides hx and follows commands  # CKD stage V - he has no acute indication for dialysis  - he states that if dialysis is something that he would need indefinitely three times a week after starting that he would not ever want it.  We discussed that would mean death and he states he is at peace with that  - I am concerned with his insight that he is not a transplant candidate and not a candidate for home dialysis - would recommend a renal diet given his strong care preferences  # HTN with CKD  - improved control   # Anemia CKD  - hb at goal; no indication for ESA  # AAA - for repair with vascular 2/13 per charting  Claudia Desanctis, MD 02/19/2021  1:50 PM

## 2021-02-19 NOTE — Progress Notes (Signed)
Patient admitted from preoperative area for systolic blood pressure greater than 230.  Anesthesia team felt it unsafe to proceed with dialysis access.  Since that time, he has decided he does not want to do dialysis.  He was still amenable to aortic aneurysm repair.  I counseled him that his annualized risk for rupture from this aneurysm is significantly lower than untreated CKD 5 / ESRD.  It is very likely that in treating the aneurysm electively, he will tip into end-stage renal disease.  I explained to him that I would not offer him aneurysm repair unless he was willing to pursue dialysis.  He is not willing to do dialysis going forward to extend his life.  Please call us should he change his mind.   Yevonne Aline. Stanford Breed, MD [facility] Phone Number: [phone] 02/19/2021 7:11 PM

## 2021-02-19 NOTE — Progress Notes (Signed)
HD#1 SUBJECTIVE:  Patient Summary: David [name] is a 51 year old male with uncontrolled hypertension, AAA, and CKD V who had presented to the pre-operative area this morning, anticipating undergoing AVF formation by Dr. Standley Dakins. He was noted to be severely hypertensive, with systolic blood pressures in the 230s. Surgery was canceled and he was transferred to the ED for blood pressure management.    Overnight Events: no acute events overnight    Interm History: Patient seen and evaluated at bedside. States he is doing well. No pain, no dizziness. Continues to produce urine. He was surprised to learn that HD would involve multiple weekly sessions for the rest of his life. Adamantly refusing HD.  OBJECTIVE:  Vital Signs: Vitals:   02/19/21 0524 02/19/21 0800 02/19/21 1210 02/19/21 1240  BP: (!) 148/114 (!) 157/131 (!) 135/99 122/89  Pulse:  66    Resp:  16    Temp:  97.7 F (36.5 C)    TempSrc:  Oral    SpO2:  98%    Weight:      Height:       Supplemental O2: Room Air SpO2: 98 %  Filed Weights   02/18/21 0628 02/19/21 0454  Weight: 94.3 kg 91 kg     Intake/Output Summary (Last 24 hours) at 02/19/2021 1310 Last data filed at 02/19/2021 0500 Gross per 24 hour  Intake 50.28 ml  Output --  Net 50.28 ml   Net IO Since Admission: 51.19 mL [02/19/21 1310]  Physical Exam: General: well appearing in no distress Eyes: no scleral icterus or conjunctival injection HENT: MMM, head atraumatic Cardiac: RRR, 4/6 systolic murmur, no LE edema, no abdominal bruit Pulm: breathing comfortably on room air, lungs clear GI: abdomen soft, non-tender, non-distended Skin: no rash or lesion MSK: normal bulk and tone Neuro: a/o x4. Moves all extremities. No facial droop. Normal speech.  Patient Lines/Drains/Airways Status     Active Line/Drains/Airways     Name Placement date Placement time Site Days   Peripheral IV 02/18/21 20 G Left Antecubital 02/18/21  0804  Antecubital  1             Pertinent Labs: CBC Latest Ref Rng & Units 02/19/2021 02/18/2021 02/18/2021  WBC 4.0 - 10.5 K/uL 3.7(L) 4.1 -  Hemoglobin 13.0 - 17.0 g/dL 11.2(L) 11.7(L) 13.3  Hematocrit 39.0 - 52.0 % 35.6(L) 36.7(L) 39.0  Platelets 150 - 400 K/uL 231 243 -    CMP Latest Ref Rng & Units 02/19/2021 02/18/2021 02/18/2021  Glucose 70 - 99 mg/dL 100(H) 94 90  BUN 6 - 20 mg/dL 47(H) 51(H) 46(H)  Creatinine 0.61 - 1.24 mg/dL 5.84(H) 6.15(H) 6.50(H)  Sodium 135 - 145 mmol/L 137 138 139  Potassium 3.5 - 5.1 mmol/L 3.6 4.1 3.9  Chloride 98 - 111 mmol/L 103 104 103  CO2 22 - 32 mmol/L 21(L) 24 -  Calcium 8.9 - 10.3 mg/dL 8.9 8.6(L) -  Total Protein 6.5 - 8.1 g/dL - - -  Total Bilirubin 0.3 - 1.2 mg/dL - - -  Alkaline Phos 38 - 126 U/L - - -  AST 15 - 41 U/L - - -  ALT 0 - 44 U/L - - -    No results for input(s): GLUCAP in the last 72 hours.   Pertinent Imaging: ECHOCARDIOGRAM COMPLETE  Result Date: 02/18/2021    ECHOCARDIOGRAM REPORT   Patient Name:   David [name] Date of Exam: 02/18/2021 Medical Rec #:  [id number]  Height:  66.0 in Accession #:    [id number] Weight:       208.0 lb Date of Birth:  [birth year]  BSA:          2.033 m Patient Age:    64 years   BP:           150/109 mmHg Patient Gender: M          HR:           54 bpm. Exam Location:  Inpatient Procedure: 2D Echo, Cardiac Doppler and Color Doppler Indications:    Murmur  History:        Patient has prior history of Echocardiogram examinations, most                 recent 01/15/2021. Cardiomyopathy.  Sonographer:    Luisa Hart RDCS Referring Phys: [place]  1. Moderate to severe LVH with speckled appearance. Strain with preserved apex, concerning for amyloid. PYP amyloid scan performed but not yet finalized. If equivocal, consider cMRI for further evaluation. Left ventricular ejection fraction, by estimation, is 55 to 60%. The left ventricle has normal function. The left ventricle has no regional wall motion abnormalities. There  is moderate left ventricular hypertrophy. Left ventricular diastolic parameters are consistent with Grade I diastolic dysfunction (impaired relaxation). Elevated left ventricular end-diastolic pressure.  2. Right ventricular systolic function is normal. The right ventricular size is normal.  3. Left atrial size was moderately dilated.  4. The mitral valve is normal in structure. No evidence of mitral valve regurgitation. No evidence of mitral stenosis.  5. The aortic valve is tricuspid. There is mild calcification of the aortic valve. Aortic valve regurgitation is not visualized. Aortic valve sclerosis/calcification is present, without any evidence of aortic stenosis.  6. Pulmonic valve regurgitation not assessed.  7. The inferior vena cava is normal in size with greater than 50% respiratory variability, suggesting right atrial pressure of 3 mmHg. Comparison(s): No significant change from prior study. FINDINGS  Left Ventricle: Moderate to severe LVH with speckled appearance. Strain with preserved apex, concerning for amyloid. PYP amyloid scan performed but not yet finalized. If equivocal, consider cMRI for further evaluation. Left ventricular ejection fraction, by estimation, is 55 to 60%. The left ventricle has normal function. The left ventricle has no regional wall motion abnormalities. The left ventricular internal cavity size was small. There is moderate left ventricular hypertrophy. Left ventricular diastolic parameters are consistent with Grade I diastolic dysfunction (impaired relaxation). Elevated left ventricular end-diastolic pressure. Right Ventricle: The right ventricular size is normal. Right vetricular wall thickness was not well visualized. Right ventricular systolic function is normal. Left Atrium: Left atrial size was moderately dilated. Right Atrium: Right atrial size was normal in size. Pericardium: Trivial pericardial effusion is present. Mitral Valve: The mitral valve is normal in structure. No  evidence of mitral valve regurgitation. No evidence of mitral valve stenosis. MV peak gradient, 3.3 mmHg. The mean mitral valve gradient is 1.0 mmHg. Tricuspid Valve: The tricuspid valve is normal in structure. Tricuspid valve regurgitation is not demonstrated. No evidence of tricuspid stenosis. Aortic Valve: The aortic valve is tricuspid. There is mild calcification of the aortic valve. Aortic valve regurgitation is not visualized. Aortic valve sclerosis/calcification is present, without any evidence of aortic stenosis. Aortic valve mean gradient measures 4.5 mmHg. Aortic valve peak gradient measures 8.3 mmHg. Aortic valve area, by VTI measures 4.65 cm. Pulmonic Valve: The pulmonic valve was not well visualized. Pulmonic valve regurgitation not assessed. Aorta:  The aortic root, ascending aorta, aortic arch and descending aorta are all structurally normal, with no evidence of dilitation or obstruction. Venous: The inferior vena cava is normal in size with greater than 50% respiratory variability, suggesting right atrial pressure of 3 mmHg. IAS/Shunts: The atrial septum is grossly normal.  LEFT VENTRICLE PLAX 2D LVIDd:         3.52 cm     Diastology LVIDs:         2.40 cm     LV e' medial:    2.28 cm/s LV PW:         1.90 cm     LV E/e' medial:  19.9 LV IVS:        1.80 cm     LV e' lateral:   4.54 cm/s LVOT diam:     2.60 cm     LV E/e' lateral: 10.0 LV SV:         113 LV SV Index:   55 LVOT Area:     5.31 cm  LV Volumes (MOD) LV vol d, MOD A2C: 49.9 ml LV vol d, MOD A4C: 61.8 ml LV vol s, MOD A2C: 22.6 ml LV vol s, MOD A4C: 33.6 ml LV SV MOD A2C:     27.3 ml LV SV MOD A4C:     61.8 ml LV SV MOD BP:      29.7 ml RIGHT VENTRICLE TAPSE (M-mode): 2.2 cm LEFT ATRIUM             Index LA diam:        3.20 cm 1.57 cm/m LA Vol (A2C):   75.6 ml 37.18 ml/m LA Vol (A4C):   70.1 ml 34.47 ml/m LA Biplane Vol: 75.1 ml 36.93 ml/m  AORTIC VALVE                    PULMONIC VALVE AV Area (Vmax):    4.41 cm     PV Vmax:        0.80 m/s AV Area (Vmean):   4.23 cm     PV Vmean:      54.300 cm/s AV Area (VTI):     4.65 cm     PV VTI:        0.142 m AV Vmax:           144.00 cm/s  PV Peak grad:  2.6 mmHg AV Vmean:          97.550 cm/s  PV Mean grad:  1.0 mmHg AV VTI:            0.242 m AV Peak Grad:      8.3 mmHg AV Mean Grad:      4.5 mmHg LVOT Vmax:         119.50 cm/s LVOT Vmean:        77.750 cm/s LVOT VTI:          0.213 m LVOT/AV VTI ratio: 0.88  AORTA Ao Root diam: 3.80 cm MITRAL VALVE MV Area (PHT): 1.94 cm    SHUNTS MV Area VTI:   3.83 cm    Systemic VTI:  0.21 m MV Peak grad:  3.3 mmHg    Systemic Diam: 2.60 cm MV Mean grad:  1.0 mmHg MV Vmax:       0.91 m/s MV Vmean:      47.9 cm/s MV Decel Time: 391 msec MV E velocity: 45.30 cm/s MV A velocity: 71.90 cm/s MV E/A ratio:  0.63 Bridgette Christopher  MD Electronically signed by Buford Dresser MD Signature Date/Time: 02/18/2021/6:36:16 PM    Final     ASSESSMENT/PLAN:  Assessment: Principal Problem:   Hypertensive urgency   Plan: Hypertensive urgency - likely rebound htn due to stopping clonidine. Asymptomatic.  - currently being controlled amlodipine,  coreg, chlorthalidone, and clonidine in addition to nitro drip. Nitro drip being weaned. BP currently stable.  - will continue to wean nitro and continue other medications as above.   Non-oliguric CKD V - likely due to uncontrolled BP.  Renal function remains stable. He does not require emergent dialysis at this time. AVF was scheduled for today, however, patient did not realize that HD would require sessions three times weekly for the rest of his life. Nephrology was called to discuss further options with him regarding treatment. He is adamantly refusing HD. Vascular has canceled AVF sgx today as well as EVAR.  - continue to monitor renal function for now.  - patient would likely benefit from palliative consult to discuss goals of care.   Infrarenal aortic aneurysm and thoracic aortic aneurysm - EVAR  initially scheduled for 2/13, canceled since patient is not wishing to start HD. Will continue to follow.    Signature: Delene Ruffini, MD  Internal Medicine Resident, PGY-1 [facility]  Pager: [phone] 1:10 PM, 02/19/2021   Please contact the on call pager after 5 pm and on weekends at [phone].

## 2021-02-20 DIAGNOSIS — N186 End stage renal disease: Secondary | ICD-10-CM

## 2021-02-20 DIAGNOSIS — I714 Abdominal aortic aneurysm, without rupture, unspecified: Secondary | ICD-10-CM

## 2021-02-20 DIAGNOSIS — Z7189 Other specified counseling: Secondary | ICD-10-CM

## 2021-02-20 LAB — BASIC METABOLIC PANEL
Anion gap: 12 (ref 5–15)
BUN: 48 mg/dL — ABNORMAL HIGH (ref 6–20)
CO2: 23 mmol/L (ref 22–32)
Calcium: 9 mg/dL (ref 8.9–10.3)
Chloride: 102 mmol/L (ref 98–111)
Creatinine, Ser: 6.04 mg/dL — ABNORMAL HIGH (ref 0.61–1.24)
GFR, Estimated: 11 mL/min — ABNORMAL LOW (ref >60)
Glucose, Bld: 104 mg/dL — ABNORMAL HIGH (ref 70–99)
Potassium: 3.7 mmol/L (ref 3.5–5.1)
Sodium: 137 mmol/L (ref 135–145)

## 2021-02-20 MED ORDER — CLONIDINE HCL 0.2 MG PO TABS
0.3000 mg | ORAL_TABLET | Freq: Three times a day (TID) | ORAL | Status: DC
  Administered 2021-02-20 – 2021-02-21 (×3): 0.3 mg via ORAL
  Filled 2021-02-20 (×3): qty 1

## 2021-02-20 NOTE — TOC CM/SW Note (Signed)
Writer advised to offer patient hospice choice. Went to bedside to discuss hospice agencies and to offer choice. Patient informed Probation officer that he was on the phone with family discussing pursuing treatment in [place]. Patient did not want to choose hospice agency at time of writer's bedside visit. Notification sent to treatment team to make aware.    Marthenia Rolling, MSN, RN,BSN Inpatient [facility] Case Manager [phone]

## 2021-02-20 NOTE — Progress Notes (Signed)
Assessed patients understanding around the discussions presented to him involving dialysis and aortic repair. Patient was articulate and knowledgeable. Able to express feelings around being ok with death. Still not interested in dialysis, stating that starting it would already feel like death to him as 3 days of week indefinitely would consume too much of his life. Content with his life as it is. Told patient that palliative care has been consulted and will likely speak with him soon about these same topics. No complaints of pain and blood pressure remaining a little elevated at 140s/100s.

## 2021-02-20 NOTE — Progress Notes (Signed)
HD#2 SUBJECTIVE:  Patient Summary: David [name] is a 51 year old male with uncontrolled hypertension, AAA, and CKD V who had presented to the pre-operative area this morning, anticipating undergoing AVF formation by Dr. Standley Dakins. He was noted to be severely hypertensive, with systolic blood pressures in the 230s. Surgery was canceled and he was transferred to the ED for blood pressure management.    Overnight Events: no acute events overnight    Interm History: Patient seen and evaluated at bedside. Says he is feeling well. No complaints at this time.  OBJECTIVE:  Vital Signs: Vitals:   02/19/21 1647 02/19/21 2006 02/20/21 0512 02/20/21 1218  BP: (!) 131/104 (!) 142/104 (!) 141/105 (!) 143/97  Pulse: (!) 51  (!) 58 79  Resp: _0 Temp: 97.7 F (36.5 C) 98 F (36.7 C) 98.2 F (36.8 C) 98.1 F (36.7 C)  TempSrc: Oral Oral Oral Oral  SpO2: 100% 100% 98% 96%  Weight:      Height:       Supplemental O2: Room Air SpO2: 96 %  Filed Weights   02/18/21 0628 02/19/21 0454  Weight: 94.3 kg 91 kg     Intake/Output Summary (Last 24 hours) at 02/20/2021 1500 Last data filed at 02/20/2021 1000 Gross per 24 hour  Intake 310.12 ml  Output 1200 ml  Net -889.88 ml    Net IO Since Admission: -968.69 mL [02/20/21 1500]  Physical Exam: General: well appearing in no distress Eyes: no scleral icterus or conjunctival injection HENT: MMM, head atraumatic Cardiac: RRR, 4/6 systolic murmur, no LE edema, no abdominal bruit Pulm: breathing comfortably on room air, lungs clear GI: abdomen soft, non-tender, non-distended Skin: no rash or lesion MSK: normal bulk and tone Neuro: a/o x4. Moves all extremities. No facial droop. Normal speech.  Patient Lines/Drains/Airways Status     Active Line/Drains/Airways     Name Placement date Placement time Site Days   Peripheral IV 02/18/21 20 G Left Antecubital 02/18/21  0804  Antecubital  1            Pertinent Labs: CBC Latest  Ref Rng & Units 02/19/2021 02/18/2021 02/18/2021  WBC 4.0 - 10.5 K/uL 3.7(L) 4.1 -  Hemoglobin 13.0 - 17.0 g/dL 11.2(L) 11.7(L) 13.3  Hematocrit 39.0 - 52.0 % 35.6(L) 36.7(L) 39.0  Platelets 150 - 400 K/uL 231 243 -    CMP Latest Ref Rng & Units 02/20/2021 02/19/2021 02/18/2021  Glucose 70 - 99 mg/dL 104(H) 100(H) 94  BUN 6 - 20 mg/dL 48(H) 47(H) 51(H)  Creatinine 0.61 - 1.24 mg/dL 6.04(H) 5.84(H) 6.15(H)  Sodium 135 - 145 mmol/L 137 137 138  Potassium 3.5 - 5.1 mmol/L 3.7 3.6 4.1  Chloride 98 - 111 mmol/L 102 103 104  CO2 22 - 32 mmol/L 23 21(L) 24  Calcium 8.9 - 10.3 mg/dL 9.0 8.9 8.6(L)  Total Protein 6.5 - 8.1 g/dL - - -  Total Bilirubin 0.3 - 1.2 mg/dL - - -  Alkaline Phos 38 - 126 U/L - - -  AST 15 - 41 U/L - - -  ALT 0 - 44 U/L - - -    No results for input(s): GLUCAP in the last 72 hours.   Pertinent Imaging: No results found.  ASSESSMENT/PLAN:  Assessment: Principal Problem:   Hypertensive urgency   Plan: Hypertensive urgency - likely rebound htn due to stopping clonidine. Asymptomatic.  - currently being controlled amlodipine,  coreg, chlorthalidone, and clonidine in addition to nitro drip.  Nitro drip weaned. BP slightly above goal - increase clonidine TID  Non-oliguric CKD V - likely due to uncontrolled BP.  Renal function remains stable. He does not require emergent dialysis at this time. AVF was scheduled for today, however, patient did not realize that HD would require sessions three times weekly for the rest of his life. Nephrology was called to discuss further options with him regarding treatment. He is adamantly refusing HD. Vascular has canceled AVF sgx today as well as EVAR.  - palliative has met with the patient. He does not wish to pursue HD. He does still wish to receive treatment for BP, for which we will continue his current regimen and increase clonidine to TID. He is refusing hospice services.   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 Will continue to follow.    Signature: Delene Ruffini, MD  Internal Medicine Resident, PGY-1 [facility]  Pager: [phone] 3:00 PM, 02/20/2021   Please contact the on call pager after 5 pm and on weekends at [phone].

## 2021-02-20 NOTE — Consult Note (Signed)
Palliative Medicine Inpatient Consult Note  Reason for consult:  Goals of Care  HPI:  Per intake H&P 2/9 by Dr. Darrick Meigs "David [name] is a 51 year old male with uncontrolled hypertension, AAA, and CKD V who had presented to the pre-operative area this morning, anticipating undergoing AVF formation by Dr. Standley Dakins. He was noted to be severely hypertensive, with systolic blood pressures in the 230s. Surgery was canceled and he was transferred to the ED for blood pressure management.   He says that he checks his home blood pressures regularly--systolics range 371-696VELF, diastolics range 810-175ZWCH.  He reports medication adherence. He was instructed to not take anything by mouth this morning so he did not taking his morning medications which include amlodipine, chlorthalidone, clonidine and coreg.  He denies headache, focal neurologic changes, vision changes, chest pain, or shortness of breath.    Recently admitted to the ICU last month for hypertensive emergency and PRES. Denies any residual deficits."   Aneurysm repair is not an option without the pursuit of dialysis per Dr. Mora Appl note.  Clinical Assessment/Goals of Care: I have reviewed medical records including EPIC notes, labs and imaging, received report from bedside RN Alver Fisher, assessed the patient.    I met with [name] and his wife David [name] via speaker phone together [phone]) to further discuss diagnosis prognosis, [place], EOL wishes, disposition and options.   I introduced Palliative Medicine as specialized medical care for people living with serious illness. It focuses on providing relief from the symptoms and stress of a serious illness. The goal is to improve quality of life for both the patient and the family.  A detailed discussion was had today regarding advanced directives.  Concepts specific to code status, artifical feeding and hydration, continued IV antibiotics and rehospitalization was had.  The difference  between a aggressive medical intervention path  and a palliative comfort care path for this patient at this time was had. Values and goals of care important to patient and family were attempted to be elicited.  David [name] immigrated to the [place] in 2005 from the [place]. He grew up as an orphan. His wife and 5 children live here in [place]. He has sons 75 and 25 and daughters 51, 91 and 8 years. His wife is a Quarry manager and does homecare.He has worked as a Naval architect.  He is of Muslim faith and has strong spiritual beliefs. He feels only God can determine when you die.   We had a long conversation about dialysis. He is at Stage 5 Chronic Kidney disease. He did not understand until he was in the hospital that he would have to be dialyzed three times a week for life. A kidney transplant would not be an option he states based upon his faith. This is impacting his decision for dialysis as he does not want to be at a dialysis center 3 times a week. He understands why the aneurysm surgery should not be done with his no dialysis decision. He is comfortable with the decisions that he has made. His wife is supportive of his decisions. We reviewed the MOST form and it has been completed for no CPR, comfort measures, determine antibiotics when infection occurs, no IV fluids, and no feeding tube.  I have scanned the form to his chart. He is open to comfort care, hospice at home and to not return to hospital. He wants his BP treated.   He is anxious to  go home asap.   Discussed the importance of continued conversation with family and their  medical providers regarding overall plan of care and treatment options, ensuring decisions are within the context of the patients values and GOCs.  He denies pain or symptom management needs at this time.  Provided  "Gone From My Site" booklet for family.  Decision Maker: Patient  SUMMARY OF RECOMMENDATIONS    Code Status/Advance  Care Planning:  DNAR/DNI  Symptom Management:  Headaches- acetaminophen prn   Palliative Prophylaxis:  Comfort measures, guidance to family on what to expect with no dialysis in stage 5 kidney disease.  Additional Recommendations (Limitations, Scope, Preferences): Consult to dietician for renal diet education  Psycho-social/Spiritual:  Desire for further Chaplaincy support: declines Additional Recommendations: Nonessential orders discontinued.   Prognosis: Poor with stage 5 kidney disease, refusing dialysis.  Discharge Planning: Consult transitions of care team for hospice consult with discharge.  Review of Systems  Constitutional:  Positive for malaise/fatigue.  HENT: Negative.    Eyes: Negative.   Respiratory: Negative.    Cardiovascular: Negative.   Gastrointestinal: Negative.   Genitourinary: Negative.   Musculoskeletal: Negative.   Skin: Negative.   Neurological:  Positive for headaches.  Endo/Heme/Allergies: Negative.   Psychiatric/Behavioral: Negative.     Vitals with BMI 02/20/2021 02/19/2021 02/19/2021  Height - - -  Weight - - -  BMI - - -  Systolic 753 005 110  Diastolic 211 173 567  Pulse 58 - 51    Physical Exam Constitutional:      Appearance: He is obese.  HENT:     Head: Normocephalic and atraumatic.     Nose: Nose normal.  Eyes:     Pupils: Pupils are equal, round, and reactive to light.  Cardiovascular:     Rate and Rhythm: Bradycardia present.  Pulmonary:     Effort: Pulmonary effort is normal.  Abdominal:     Palpations: Abdomen is soft.  Musculoskeletal:     Cervical back: Normal range of motion and neck supple.     Right lower leg: Edema present.     Left lower leg: Edema present.  Skin:    General: Skin is warm and dry.  Neurological:     General: No focal deficit present.     Mental Status: He is alert and oriented to person, place, and time.  Psychiatric:        Mood and Affect: Mood normal.        Behavior: Behavior normal.         Thought Content: Thought content normal.        Judgment: Judgment normal.    PPS:60%   This conversation/these recommendations were discussed with patient primary care team, Dr. Dareen Piano via secure chat.  Thank you for the opportunity to participate in the care of this patient and family.   Total Time: 85 minutes Greater than 50%  of this time was spent counseling and coordinating care related to the above assessment and plan.  Lindell Spar, NP [facility] Palliative Medicine Team Team Cell Phone: [phone] Please utilize secure chat with additional questions, if there is no response within 30 minutes please call the above phone number  Palliative Medicine Team providers are available by phone from 7am to 7pm daily and can be reached through the team cell phone.  Should this patient require assistance outside of these hours, please call the patient's attending physician.

## 2021-02-21 DIAGNOSIS — I10 Essential (primary) hypertension: Secondary | ICD-10-CM

## 2021-02-21 DIAGNOSIS — Z515 Encounter for palliative care: Secondary | ICD-10-CM

## 2021-02-21 LAB — CBC
HCT: 37.4 % — ABNORMAL LOW (ref 39.0–52.0)
Hemoglobin: 12.2 g/dL — ABNORMAL LOW (ref 13.0–17.0)
MCH: 26.9 pg (ref 26.0–34.0)
MCHC: 32.6 g/dL (ref 30.0–36.0)
MCV: 82.4 fL (ref 80.0–100.0)
Platelets: 270 10*3/uL (ref 150–400)
RBC: 4.54 MIL/uL (ref 4.22–5.81)
RDW: 13.9 % (ref 11.5–15.5)
WBC: 3.9 10*3/uL — ABNORMAL LOW (ref 4.0–10.5)
nRBC: 0 % (ref 0.0–0.2)

## 2021-02-21 LAB — BASIC METABOLIC PANEL
Anion gap: 12 (ref 5–15)
BUN: 47 mg/dL — ABNORMAL HIGH (ref 6–20)
CO2: 24 mmol/L (ref 22–32)
Calcium: 9.3 mg/dL (ref 8.9–10.3)
Chloride: 100 mmol/L (ref 98–111)
Creatinine, Ser: 6.3 mg/dL — ABNORMAL HIGH (ref 0.61–1.24)
GFR, Estimated: 10 mL/min — ABNORMAL LOW (ref >60)
Glucose, Bld: 116 mg/dL — ABNORMAL HIGH (ref 70–99)
Potassium: 3.6 mmol/L (ref 3.5–5.1)
Sodium: 136 mmol/L (ref 135–145)

## 2021-02-21 MED ORDER — CARVEDILOL 25 MG PO TABS
12.5000 mg | ORAL_TABLET | Freq: Two times a day (BID) | ORAL | 3 refills | Status: AC
  Filled 2021-02-21: qty 90, 90d supply, fill #0

## 2021-02-21 MED ORDER — CARVEDILOL 12.5 MG PO TABS: 12.5000 mg | ORAL_TABLET | Freq: Two times a day (BID) | ORAL | Status: DC

## 2021-02-21 MED ORDER — CLONIDINE HCL 0.3 MG PO TABS
0.3000 mg | ORAL_TABLET | Freq: Three times a day (TID) | ORAL | 3 refills | Status: AC
  Filled 2021-02-21: qty 90, 30d supply, fill #0

## 2021-02-21 MED ORDER — CARVEDILOL 12.5 MG PO TABS
12.5000 mg | ORAL_TABLET | Freq: Two times a day (BID) | ORAL | Status: DC
  Administered 2021-02-21: 12.5 mg via ORAL
  Filled 2021-02-21: qty 1

## 2021-02-21 NOTE — Discharge Instructions (Addendum)
David [name], It was a pleasure meeting you.   For your heart rate, it is sometimes low, please get a pulse oximeter (a device that you will put on your finger and it will tell you what your heart rate is). You can get this from [place] or from [facility]. Please check your heart rate before your morning medications and before your nighttime medications. IF your heart rate is in the 40s please hold your dose of CARVEDILOL or coreg and let your doctor know immediately.    For your blood pressure, we have continued you on all of your home medications. This includes: amlodipine 10mg  once daily, chlorthalidone 25mg  once daily, clonidine, which we have changed this hospitalization to 0.3mg  THREE times a day, and Carvedilol (COREG) which changed to 12.5 mg (OR half a tablet) two times a day.   Please follow up with your primary care doctor this week to make sure your hypertension medications are appropriately controlling your blood pressures.   Regarding the aneurysm of your aorta:  The number for [facility] vascular surgery: Call the [place] referral line at [phone] to find vascular specialists, make an appointment or learn more about vascular care.  For [facility]: [phone] (Vascular) [phone] (Heart)

## 2021-02-21 NOTE — Progress Notes (Signed)
° °  Palliative Medicine Inpatient Follow Up Note   HPI: 51 year old male with uncontrolled asymptomatic  hypertension, Abdominal Aortic Aneurysm, and Chronic Kidney Disease Stage 5 who is declining dialysis. Aneurysm repair is not an option without pursuit of dialysis.    Today's Discussion February 21, 2021  Chart reviewed inclusive of vital signs, progress notes, laboratory results, and diagnostic images.   Created space and opportunity for patient to explore thoughts feelings and fears regarding current medical situation. Plan is for discharge today. We reviewed decisions he shared yesterday. He does not want dialysis. We reviewed again that surgery for an aneurysm is not an option unless he is pursuing dialysis. He states he will find someone to do his surgery just not here. He declined hospice referral when case manger met with him yesterday.   Questions and concerns were addressed to the best of my ability.  Palliative Support Provided. 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 I focused on things that may bring meaning and comfort to him at the end of life. I allowed time for verbalization of feelings. He again shared his faith that God is in control. He denies headache or any other concerns today.  Objective Assessment: Vital Signs Vitals:   02/21/21 0951 02/21/21 1214  BP: (!) 142/105 (!) 129/97  Pulse:  (!) 52  Resp:  15  Temp:  98.2 F (36.8 C)  SpO2:  100%   No intake or output data in the 24 hours ending 02/21/21 1314 Last Weight  Most recent update: 02/19/2021  4:56 AM    Weight  91 kg (200 lb 11.2 oz)             Gen:  NAD HEENT: moist mucous membranes CV: Regular rate and rhythm, bradycardic PULM: Respirations nonlabored EXT: Ankles 1+ edema bilateral Neuro: Alert and oriented x3, coherent thought process  SUMMARY OF RECOMMENDATIONS   Support patient with his decisions.  Thank you for allowing [place] to participate in his care.    Time Spent: 25 minutes    ______________________________________________________________________________________ Lindell Spar, NP [facility] Palliative Medicine Team Team Cell Phone: [phone] Please utilize secure chat with additional questions, if there is no response within 30 minutes please call the above phone number  Palliative Medicine Team providers are available by phone from 7am to 7pm daily and can be reached through the team cell phone.  Should this patient require assistance outside of these hours, please call the patient's attending physician.

## 2021-02-21 NOTE — Discharge Summary (Addendum)
Name: Gibbs Naugle MRN: [id number] DOB: 06/29/1970 51 y.o. PCP: Vevelyn Francois, NP  Date of Admission: 02/18/2021  6:44 AM Date of Discharge: No discharge date for patient encounter. Attending Physician: Aldine Contes, MD  Discharge Diagnosis: 1. Severe asymptomatic hypertension   Discharge Medications: Allergies as of 02/21/2021       Reactions   Hydralazine    Dizziness, abnormal sensation, nauseous        Medication List     TAKE these medications    amLODipine 10 MG tablet Commonly known as: NORVASC TAKE 1 TABLET (10 MG TOTAL) BY MOUTH DAILY. What changed: how much to take   aspirin 81 MG EC tablet Take 1 tablet (81 mg total) by mouth daily. Swallow whole.   atorvastatin 80 MG tablet Commonly known as: LIPITOR Take 1 tablet (80 mg total) by mouth daily.   carvedilol 25 MG tablet Commonly known as: COREG Take 0.5 tablets (12.5 mg total) by mouth 2 (two) times daily. What changed: how much to take   chlorthalidone 25 MG tablet Commonly known as: HYGROTON Take 1 tablet (25 mg total) by mouth daily.   cloNIDine 0.3 MG tablet Commonly known as: CATAPRES Take 1 tablet (0.3 mg total) by mouth 3 (three) times daily. What changed: when to take this        Disposition and follow-up:   Mr.Suren Serpe was discharged from [facility] in Stable condition.  At the hospital follow up visit please address:  1.  Antihypertensive regimen: Clonidine frequency increased to 0.3mg  THREE times daily, and COREG decreased to 12.5mg  given bradycardia (though asymptomatic) and prolonged PR.   2.  Labs / imaging needed at time of follow-up: EKG  3.  Pending labs/ test needing follow-up: None  Follow-up Appointments: PCP    Hospital Course by problem list:  Carlson Belland is a 51 year old male with uncontrolled hypertension, AAA, and CKD V who had presented to the pre-operative area this morning, anticipating undergoing AVF formation by Dr. Standley Dakins. He was  noted to be severely hypertensive, with systolic blood pressures in the 230s. Surgery was canceled and he was transferred to the ED for blood pressure management.   1. Severe asymptomatic hypertension  Patient presented to the hospital after he was noted to have blood pressures in the 009F systolic.  Patient was at an outpatient surgery center for his AV fistula.  He was asymptomatic and it was thought that his elevated blood pressures were secondary to patient not having his morning blood pressure medications.  While in the hospital he was started on his home medications as well as a nitroglycerin infusion.  Nitroglycerin infusion was eventually weaned and patient's medications were optimized to include chlorthalidone 25 mg daily, clonidine 0.3 mg 3 times daily, amlodipine 10 mg daily, and carvedilol 25mg  2 times daily.  Patient was noted to have some bradycardia and his carvedilol was decreased to 12 and half milligrams twice daily.  Patient was instructed to follow-up with his primary care physician within the week to adjust his medications.  2. CKD stage V Patient was seen by nephrology while inpatient and stated that he would not like to proceed with surgery for his AV fistula and would not like to be on hemodialysis in the future.  Patient stated that he understood that he would likely die from this.  Palliative care and hospice were consulted this hospitalization.  Patient was initially on board with a plan for hospice, however he changed  his mind on the day prior to discharge stating that he would like to pursue care in [place] where he has family.  Both palliative care and nephrology reiterated to patient the consequences of not starting hemodialysis, including eventual death.  3. AAA Patient was scheduled for EVAR.  However, when patient declined hemodialysis he was deemed unfit for surgery.  Patient states that he will seek second opinions.  In his discharge paper he was given the information  for surrounding surgical practices.  4. Bradycardia  Patient noted to have bradycardia in the 50s this hospitalization.  This was noted on admission and patient has been asymptomatic.  This is likely a result of his beta-blockade with carvedilol as well as his clonidine.  Patient's heart rate on discharge were in the 50s.  Given he remains asymptomatic from this will decrease his Coreg as noted above and have patient follow-up closely with his primary care physician for further titration of his antihypertensives.  I suspect patient may need to be transition to on the peripheral acting medication.  Unfortunately, choice of antihypertensives were limited given his current renal function.   Discharge Exam:   BP (!) 142/105    Pulse (!) 55    Temp 97.7 F (36.5 C) (Oral)    Resp 17    Ht 5\' 6"  (1.676 m)    Wt 91 kg    SpO2 96%    BMI 32.39 kg/m  Discharge exam:    Constitutional: Well-developed, well-nourished, and in no distress.  HENT:  Head: Normocephalic and atraumatic.  Eyes: EOM are normal.  Neck: Normal range of motion.  Cardiovascular: Normal rate, regular rhythm, intact distal pulses. No gallop and no friction rub.  No murmur heard. No lower extremity edema  Pulmonary: Non labored breathing on room air, no wheezing or rales  Abdominal: Soft. Normal bowel sounds. Non distended and non tender, pulsating abdominal aorta palpated Musculoskeletal: Normal range of motion.        General: No tenderness or edema.  Neurological: Alert and oriented to person, place, and time. Non focal  Skin: Skin is warm and dry.    Pertinent Labs, Studies, and Procedures:  BMP Latest Ref Rng & Units 02/21/2021 02/20/2021 02/19/2021  Glucose 70 - 99 mg/dL 116(H) 104(H) 100(H)  BUN 6 - 20 mg/dL 47(H) 48(H) 47(H)  Creatinine 0.61 - 1.24 mg/dL 6.30(H) 6.04(H) 5.84(H)  BUN/Creat Ratio 9 - 20 - - -  Sodium 135 - 145 mmol/L 136 137 137  Potassium 3.5 - 5.1 mmol/L 3.6 3.7 3.6  Chloride 98 - 111 mmol/L 100 102  103  CO2 22 - 32 mmol/L 24 23 21(L)  Calcium 8.9 - 10.3 mg/dL 9.3 9.0 8.9     Signed: Rick Duff, MD 02/21/2021, 11:42 AM   Pager: [phone]

## 2021-02-22 ENCOUNTER — Other Ambulatory Visit: Payer: Self-pay | Admitting: Nurse Practitioner

## 2021-02-22 ENCOUNTER — Other Ambulatory Visit: Payer: Self-pay

## 2021-02-22 DIAGNOSIS — I10 Essential (primary) hypertension: Secondary | ICD-10-CM

## 2021-02-22 SURGERY — INSERTION, ENDOVASCULAR STENT GRAFT, AORTA, ABDOMINAL
Anesthesia: General

## 2021-02-24 ENCOUNTER — Other Ambulatory Visit: Payer: Self-pay

## 2021-03-17 ENCOUNTER — Encounter (HOSPITAL_COMMUNITY): Payer: Self-pay | Admitting: Radiology

## 2021-03-22 ENCOUNTER — Telehealth: Payer: Self-pay | Admitting: *Deleted

## 2021-03-22 ENCOUNTER — Encounter: Payer: Self-pay | Admitting: Adult Health

## 2021-03-22 ENCOUNTER — Inpatient Hospital Stay: Payer: BC Managed Care – PPO | Admitting: Adult Health

## 2021-03-22 NOTE — Telephone Encounter (Signed)
Pt did not show for appointment scheduled for today.  ?

## 2021-04-10 DEATH — deceased

## 2021-04-14 NOTE — Progress Notes (Incomplete)
? ?[facility]:   ? ?Date:  04/14/2021  ? ?IDMosiah [name], DOB January 18, 1970, MRN [id number] ? ?PCP:  Vevelyn Francois, NP  ?Cardiologist:  None  ?Nephrologist: ? ?Referring MD: Vevelyn Francois, NP  ? ?CC: Hypertension ? ?History of Present Illness:   ? ?[name] is a 51 y.o. male with a hx of abdominal aortic aneurysm, hypertension, CKD IV, here for follow-up. He was initially seen 01/2021 to establish care in the [facility]. He was seen by [name], NP on 04/29/2020 and his blood pressure was 157/101. It was noted that had reduced his 3x daily medication to twice daily, and he discontinued hydralazine due to side effects. At home his blood pressure was uncontrolled at 150s-160s/90s-100s. He had declined clonidine therapy due to observance of Ramadan. He was referred to cardiology for hypertensive urgency, but instructed to follow-up in 2 weeks if he had not been seen by cardiology. He followed up with [name] 05/14/20 and his blood pressure remained uncontrolled at home on 10 mg amlodipine, 25 mg carvedilol twice daily, hydralazine 100 mg twice daily, and he had been started on 80 mg furosemide daily by his nephrologist in 03/2020. ? ?[name] was admitted to the hospital 01/14/2021 for hypertensive emergency and stroke. He presented with SBP >200 despite compliance with his antihypertensives. 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 His blood pressure was managed with clevidipine IV and then transitioned to home oral medication which provided control at 655-374 systolic. He was asymptomatic. CT showed coronary calcification and a dilated pulmonary aorta. His ascending aorta was 4.1 cm and he had aortic atherosclerosis. Ultrasound showed a infrarenal AAA of 6.1 cm with mural thrombus, and echocardiogram revealed severe LVH and LVEF 60-65%. The myocardium was speckled  and had an abnormal strain pattern concerning for amyloid. His blood pressure was 152/115 on discharge. Furosemide, hydralazine, and iron supplements were stopped. He was discharged on 10 mg amlodipine daily, 81 mg aspirin daily, 80 mg atorvastatin daily, 25 mg carvedilol twice daily, 0.3 mg clonidine twice daily, and 25 mg HCTZ daily. ? ?At his last appointment, he was doing well. He was diagnosed with hypertension several years ago. IT was initially well-controlled when he lived in [place]. Since moving to [place], his pressure was more labile. HCTZ was switched to chlorthalidone. Testing was negative for amyloidosis. Echo 02/2021 revealed LVEF 55-60% and no significant changes.  ?Today, he is feeling alright overall. He does not recall when exactly he was first diagnosed with hypertension, but notes it has been a long time. When he was living in [place], his blood pressure was controlled. Since moving to [facility] his blood pressure has been more labile. At home his blood pressure is usually ranging in the 140s-160s/104-110. Usually he does wait a few minutes before taking a reading. He exercises 2-3 times a week when he is not traveling for work. He may exercise for 15 minutes to an hour. Previously he suffered from shortness of breath with exercise. He reports this is no longer an issue since he quit smoking 4-5 months ago. He does not drink alcohol. For pain management he usually takes motrin or Advil. His wife cooks their meals at home, and does not add salt to his meals. When he wakes up in the morning he feels normal. He has not been told that he snores. He  denies any palpitations, or chest pain. No lightheadedness, headaches, syncope, orthopnea, PND, or lower extremity edema. Within a month he states he is scheduled to visit with his nephrologist. ? ?Today, *** ? ?He denies any palpitations, chest pain, or shortness of breath, lightheadedness, headaches, syncope, orthopnea, PND, lower extremity edema  or exertional symptoms. ? ?Previous antihypertensives: ?Hydralazine - anxiety ? ?Past Medical History:  ?Diagnosis Date  ? AAA (abdominal aortic aneurysm) 11/2019  ? Chronic kidney disease   ? Hypertension   ? Proteinuria 12/2019  ? Vitamin D deficiency   ? ? ?Past Surgical History:  ?Procedure Laterality Date  ? none    ? ? ?Current Medications: ?No outpatient medications have been marked as taking for the 04/15/21 encounter (Appointment) with Skeet Latch, MD.  ?  ? ?Allergies:   Hydralazine  ? ?Social History  ? ?Socioeconomic History  ? Marital status: Married  ?  Spouse name: Not on file  ? Number of children: 5  ? Years of education: Not on file  ? Highest education level: Not on file  ?Occupational History  ? Occupation: truck Geophysicist/field seismologist  ?Tobacco Use  ? Smoking status: Former  ?  Types: Cigarettes  ? Smokeless tobacco: Never  ?Vaping Use  ? Vaping Use: Never used  ?Substance and Sexual Activity  ? Alcohol use: No  ? Drug use: No  ? Sexual activity: Not on file  ?Other Topics Concern  ? Not on file  ?Social History Narrative  ? Not on file  ? ?Social Determinants of Health  ? ?Financial Resource Strain: Low Risk   ? Difficulty of Paying Living Expenses: Not hard at all  ?Food Insecurity: No Food Insecurity  ? Worried About Charity fundraiser in the Last Year: Never true  ? Ran Out of Food in the Last Year: Never true  ?Transportation Needs: No Transportation Needs  ? Lack of Transportation (Medical): No  ? Lack of Transportation (Non-Medical): No  ?Physical Activity: Insufficiently Active  ? Days of Exercise per Week: 3 days  ? Minutes of Exercise per Session: 30 min  ?Stress: Not on file  ?Social Connections: Not on file  ?  ? ?Family History: ?The patient's family history includes Hypertension in his brother, father, and mother. ? ?ROS:   ?Please see the history of present illness.    ?All other systems reviewed and are negative. ? ?EKGs/Labs/Other Studies Reviewed:   ? ?CTA Chest/Abdomen  02/18/21 ?IMPRESSION: ?1. Fusiform infrarenal abdominal aortic aneurysm measuring 6.3 cm in ?greatest axial dimension, previously measured up to 5.6 cm on prior ?2021 exam. No evidence of impending rupture. Recommend referral to a ?vascular specialist. This recommendation follows ACR consensus ?guidelines: White Paper of the ACR Incidental Findings Committee II ?on Vascular Findings. J Am Coll Radiol 2013; 10:789-794. ?2. Dilated bilateral common iliac arteries measuring up to 2.0 cm on ?the right and 2.6 cm on the left. ?3. 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 ?Recommend annual imaging followup by CTA or MRA. This recommendation ?follows 2010 ACCF/AHA/AATS/ACR/ASA/SCA/SCAI/SIR/STS/SVM Guidelines ?for the Diagnosis and Management of Patients with Thoracic Aortic ?Disease. Circulation. 2010; 121: Z610-R604. Aortic aneurysm NOS ?(ICD10-I71.9) ?4. Aortic Atherosclerosis (ICD10-I70.0) and Emphysema (ICD10-J43.9). ? ?Echo 02/18/21 ?1. Moderate to severe LVH with speckled appearance. Strain with preserved  ?apex, concerning for amyloid. PYP amyloid scan performed but not yet  ?finalized. If equivocal, consider cMRI for further evaluation. Left  ?ventricular ejection fraction, by  ?estimation, is 55 to 60%. The left ventricle has normal  function. The left  ?ventricle has no regional wall motion abnormalities. There is moderate  ?left ventricular hypertrophy. Left ventricular diastolic parameters are  ?consistent with Grade I diastolic  ?dysfunction (impaired relaxation). Elevated left ventricular end-diastolic  ?pressure.  ? 2. Right ventricular systolic function is normal. The right ventricular  ?size is normal.  ? 3. Left atrial size was moderately dilated.  ? 4. The mitral valve is normal in structure. No evidence of mitral valve  ?regurgitation. No evidence of mitral stenosis.  ? 5. The aortic valve is tricuspid. There is mild calcification of the  ?aortic valve. Aortic valve regurgitation is not  visualized. Aortic valve  ?sclerosis/calcification is present, without any evidence of aortic  ?stenosis.  ? 6. Pulmonic valve regurgitation not assessed.  ? 7. The inferior vena cava is normal in size with greater than 50%

## 2021-04-15 ENCOUNTER — Ambulatory Visit (HOSPITAL_BASED_OUTPATIENT_CLINIC_OR_DEPARTMENT_OTHER): Payer: BC Managed Care – PPO | Admitting: Cardiovascular Disease

## 2021-06-08 ENCOUNTER — Other Ambulatory Visit (HOSPITAL_COMMUNITY): Payer: BC Managed Care – PPO

## 2021-06-08 ENCOUNTER — Ambulatory Visit: Payer: BC Managed Care – PPO | Admitting: Vascular Surgery

## 2021-12-14 ENCOUNTER — Encounter: Payer: Self-pay | Admitting: Cardiovascular Disease

## 2023-09-20 IMAGING — MR MR MRA HEAD W/O CM
1 series · 20 of 48 positions shown · non-contrast
Comparison: No pertinent prior exam.

CLINICAL DATA: Acute neurologic deficit

EXAM:
MRA HEAD WITHOUT CONTRAST
TECHNIQUE: Angiographic images of the Circle of Willis were acquired using MRA
technique without intravenous contrast.

[Series 5: 3d cow · axial · 0.5mm · 0.41mm/px · z∈[-62,+19]mm · 20 of 172 slices shown]
[im 1/172]
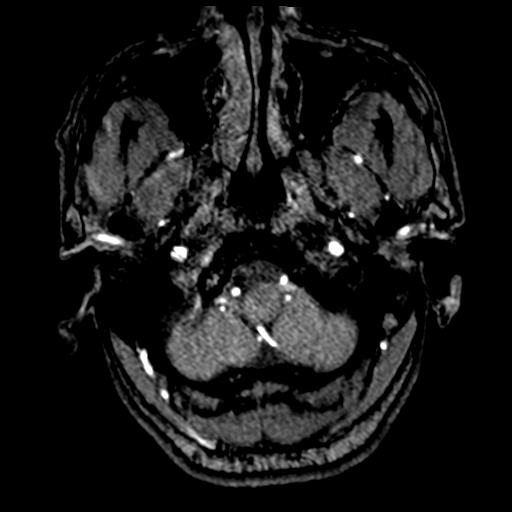
[im 4/172]
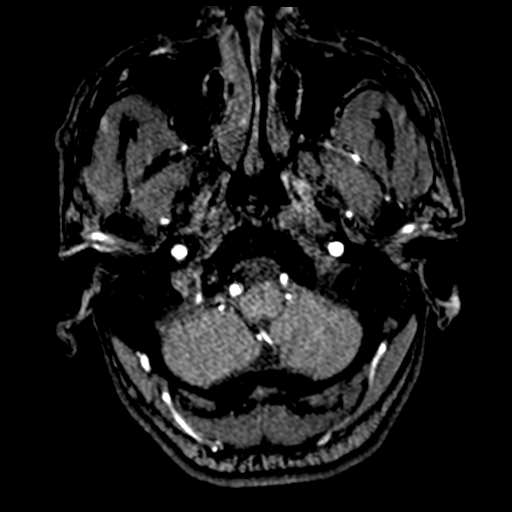
[im 8/172]
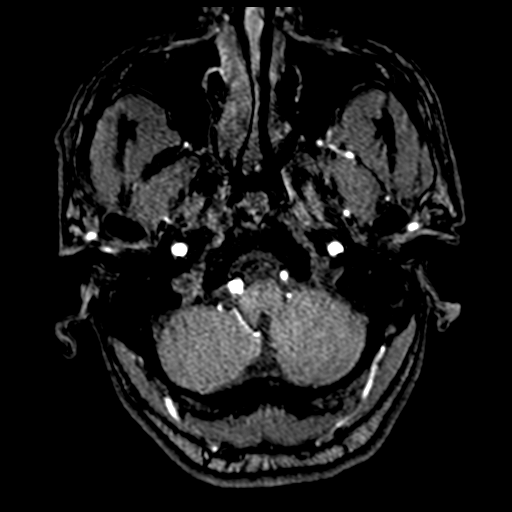
[im 11/172]
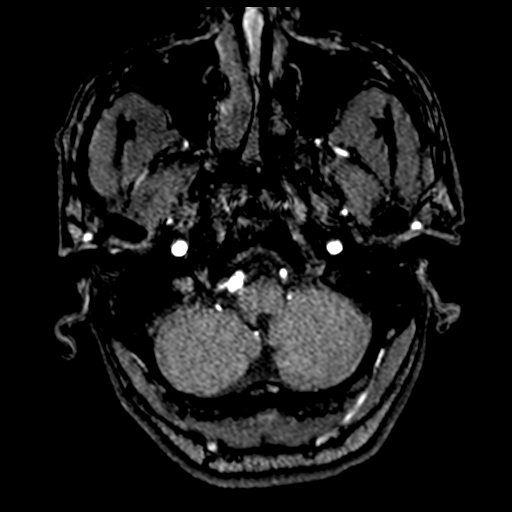
[im 15/172]
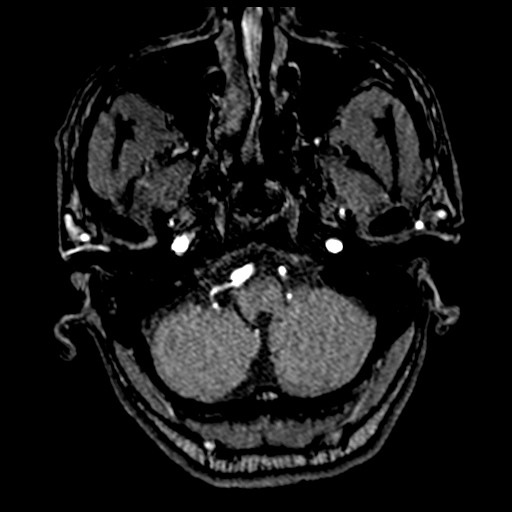
[im 19/172]
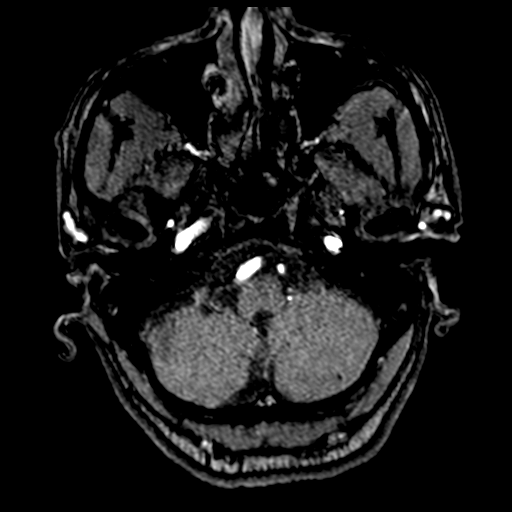
[im 22/172]
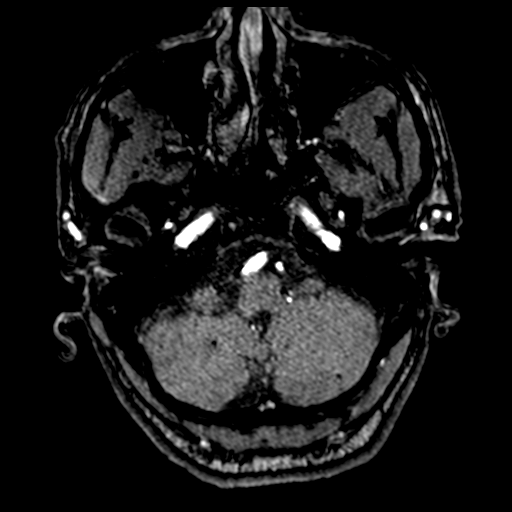
[im 26/172]
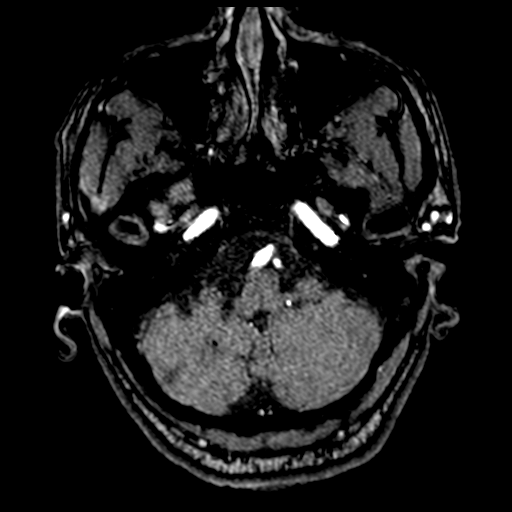
[im 30/172]
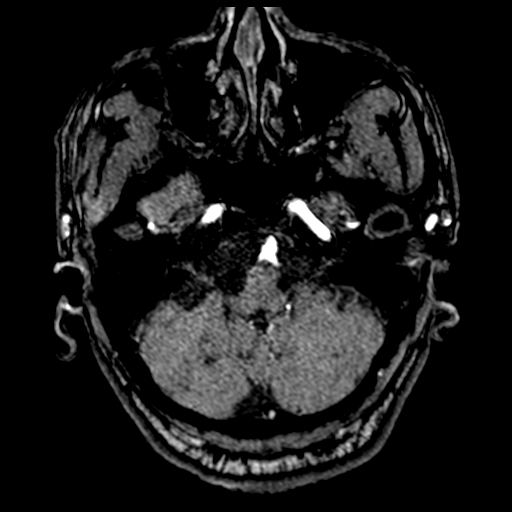
[im 33/172]
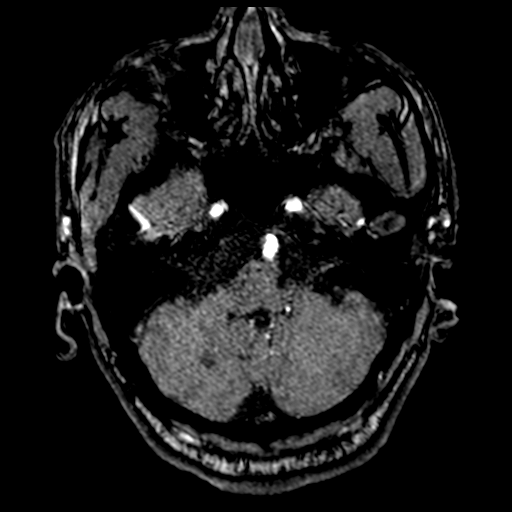
[im 37/172]
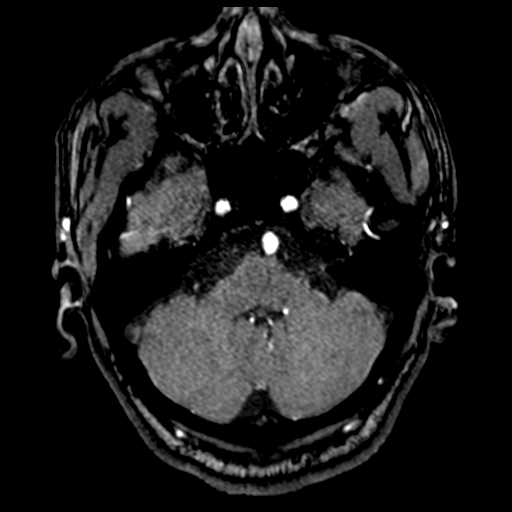
[im 41/172]
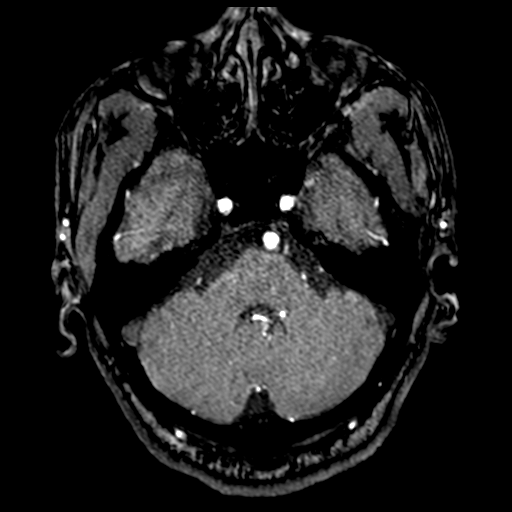
[im 55/172]
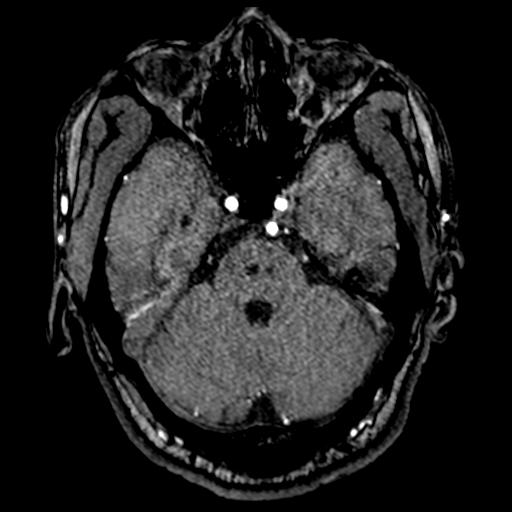
[im 77/172]
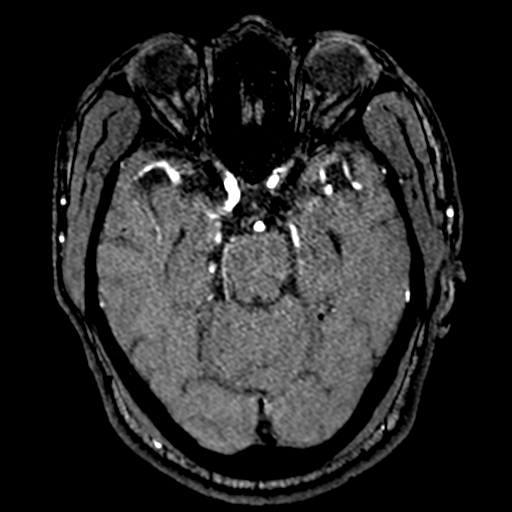
[im 88/172]
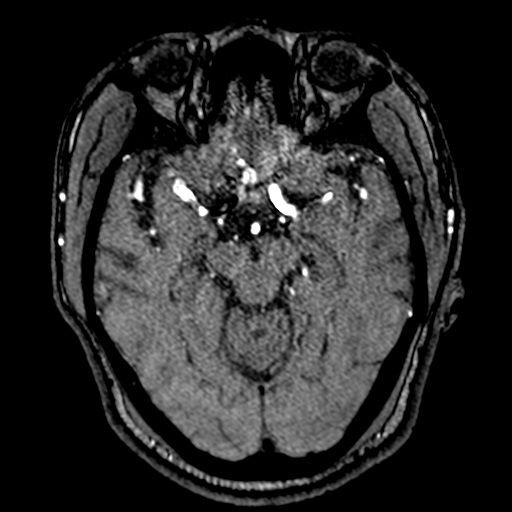
[im 99/172]
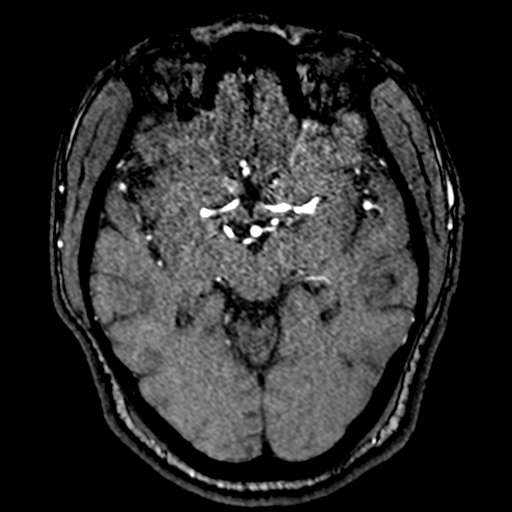
[im 121/172]
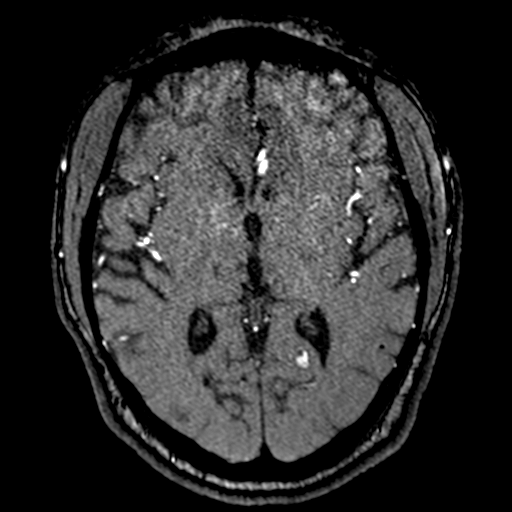
[im 142/172]
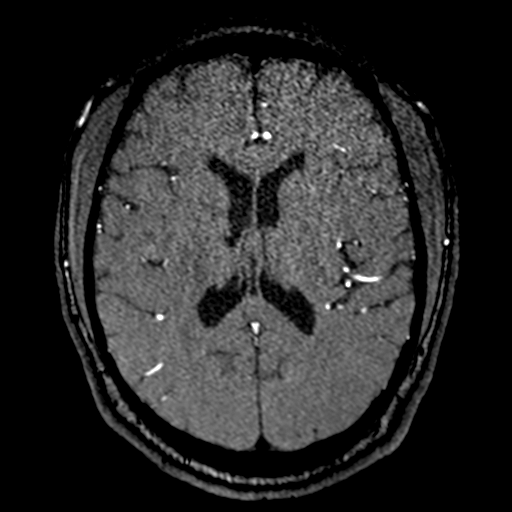
[im 146/172]
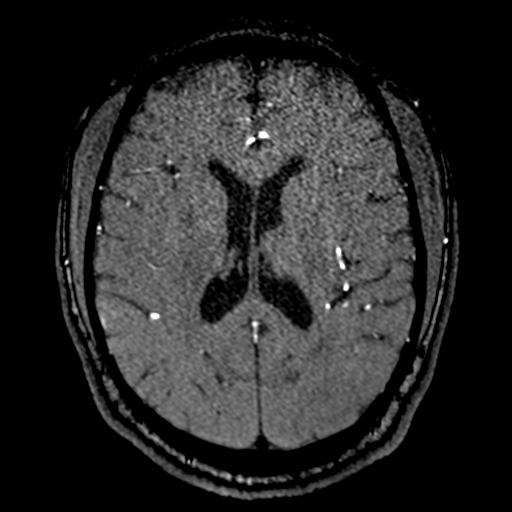
[im 164/172]
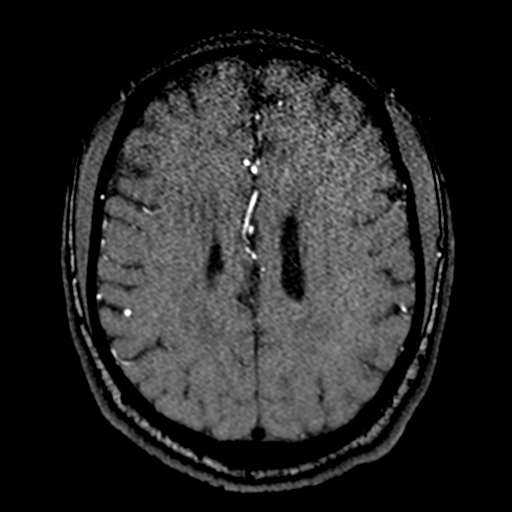

[20 of 48 positions shown; findings below may reference images not displayed]

FINDINGS: POSTERIOR CIRCULATION:

--Vertebral arteries: Mild ectasia of the right V4 segment. Normal
left.

--Inferior cerebellar arteries: Normal.

--Basilar artery: Normal.

--Superior cerebellar arteries: Normal.

--Posterior cerebral arteries: Normal.

ANTERIOR CIRCULATION:

--Intracranial internal carotid arteries: Normal.

--Anterior cerebral arteries (ACA): Normal.

--Middle cerebral arteries (MCA): Dilated appearance of the right M1
segment. Severe stenosis versus short segment occlusion of the
proximal left M2 segment. The more distal left M2 segment is normal.

ANATOMIC VARIANTS: None
IMPRESSION: 1. No emergent large vessel occlusion.
2. Severe stenosis versus short segment occlusion of the proximal
left M2 segment.

## 2023-10-23 IMAGING — CT CT ANGIO CHEST-ABD-PELV FOR DISSECTION W/ AND WO/W CM
2 of 7 series · 13 of 46 positions shown, 15 images · non-contrast
Comparison: CT abdomen and pelvis dated November 14, 2019.

CLINICAL DATA: Concern for acute aortic syndrome.

EXAM:
CT ANGIOGRAPHY CHEST, ABDOMEN AND PELVIS
TECHNIQUE: Non-contrast CT of the chest was initially obtained.

[Series 5: dissection 2mm · axial · 0.74mm/px · z∈[+952,+1570]mm · 10 of 347 slices shown, 12 images]
[im 19/347  soft-tissue]
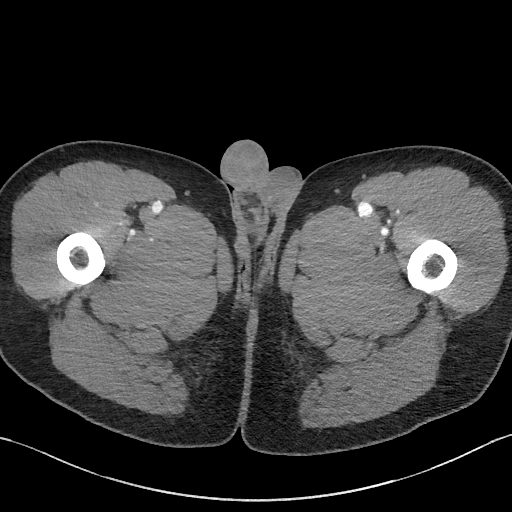
[im 19/347  bone]
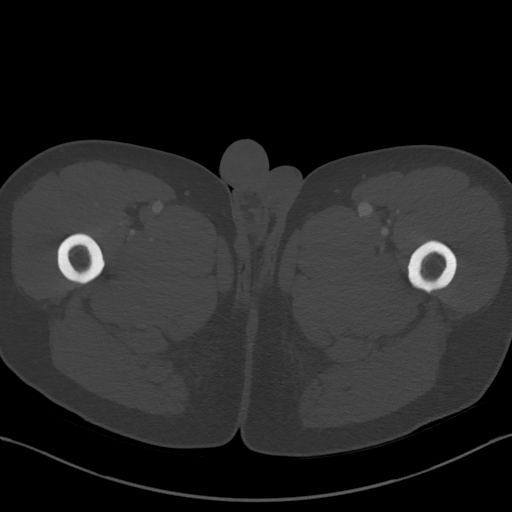
[im 55/347  soft-tissue]
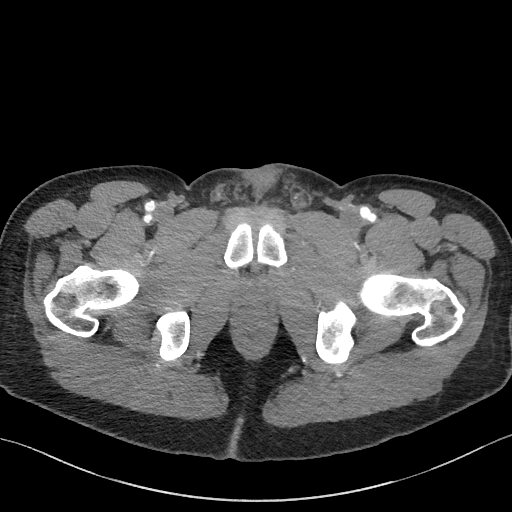
[im 92/347  soft-tissue]
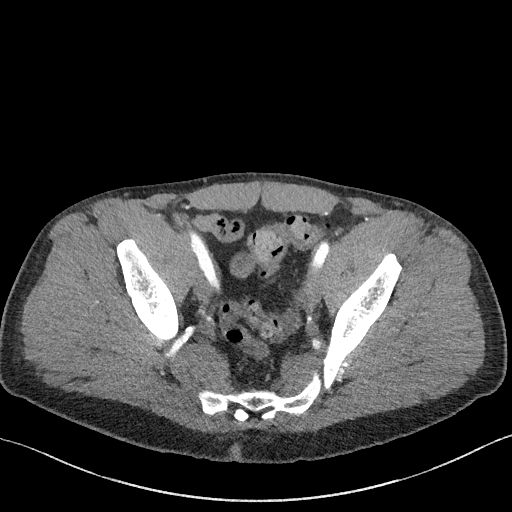
[im 128/347  soft-tissue]
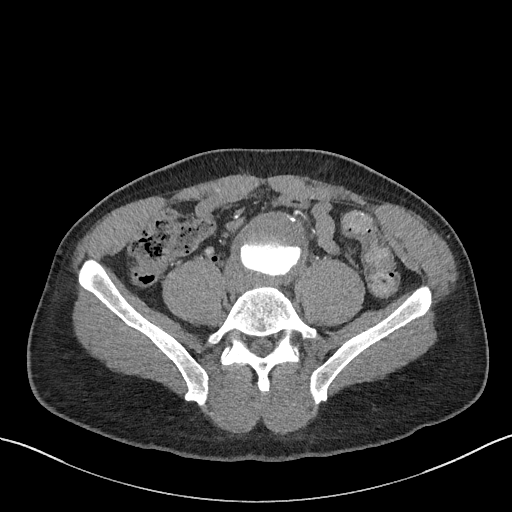
[im 164/347  soft-tissue]
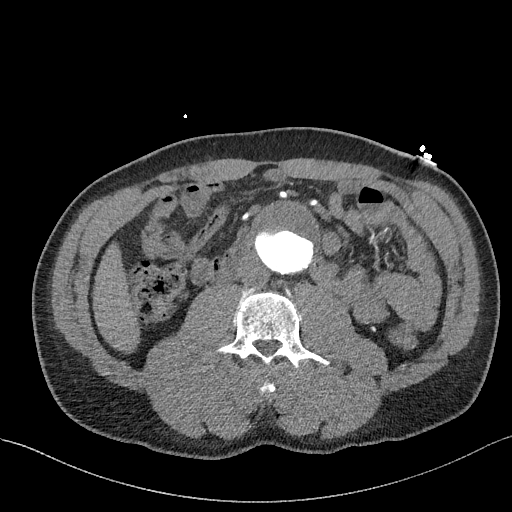
[im 183/347  soft-tissue]
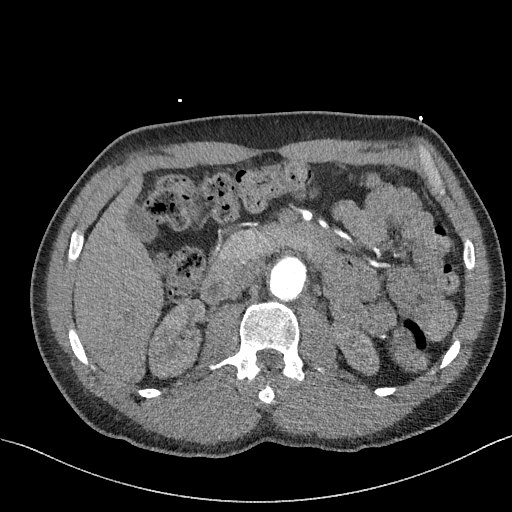
[im 219/347  soft-tissue]
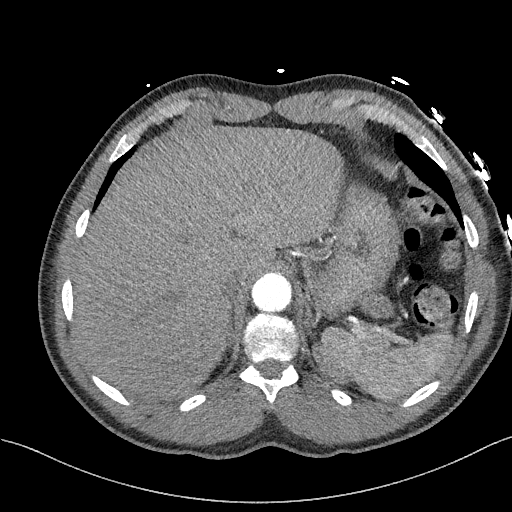
[im 255/347  soft-tissue]
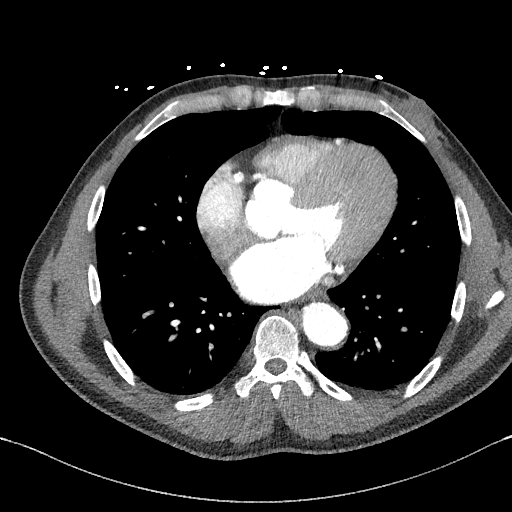
[im 292/347  soft-tissue]
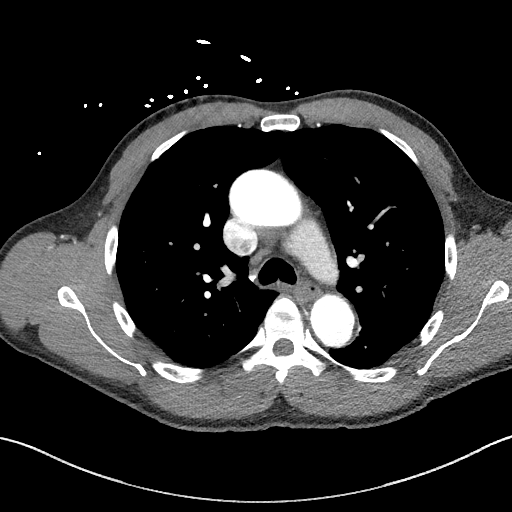
[im 292/347  bone]
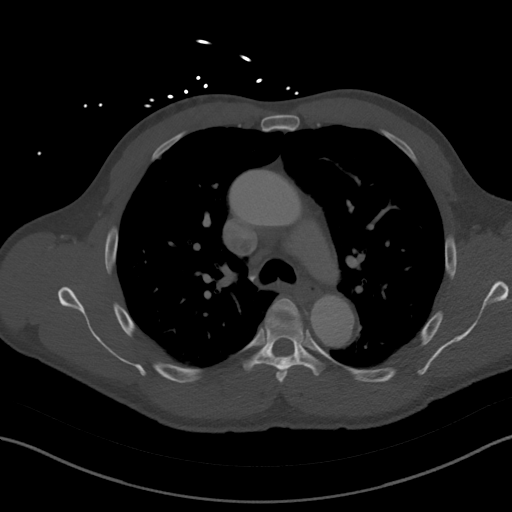
[im 328/347  soft-tissue]
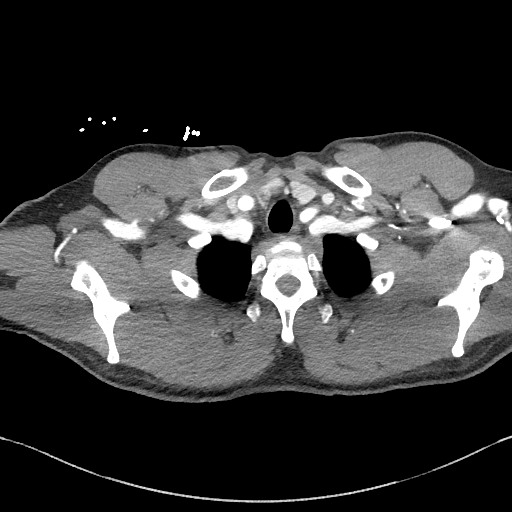

[Series 10: dissection 2mm cor · coronal · 0.95mm/px · 3 of 150 slices shown]
[im 38/150  soft-tissue]
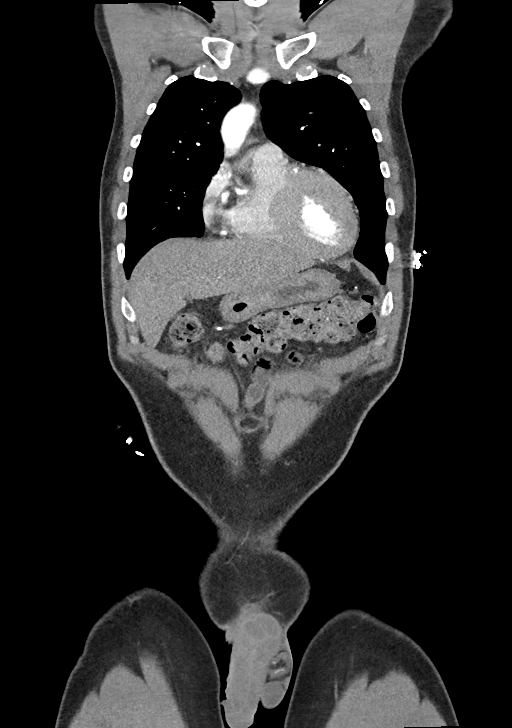
[im 75/150  soft-tissue]
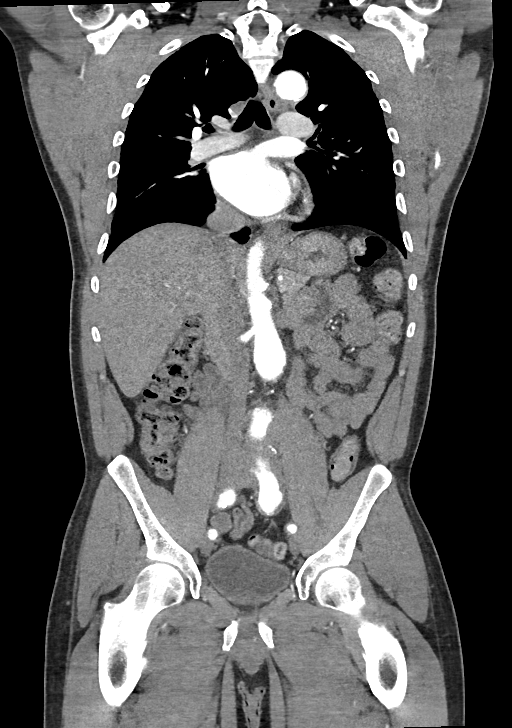
[im 112/150  soft-tissue]
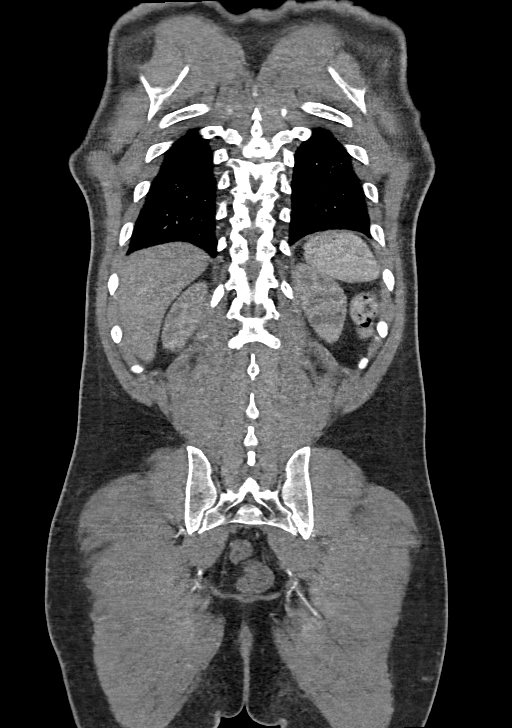

[13 of 46 positions shown; findings below may reference images not displayed]

Multidetector CT imaging through the chest, abdomen and pelvis was
performed using the standard protocol during bolus administration of
intravenous contrast. Multiplanar reconstructed images and MIPs were
obtained and reviewed to evaluate the vascular anatomy.

RADIATION DOSE REDUCTION: This exam was performed according to the
departmental dose-optimization program which includes automated
exposure control, adjustment of the mA and/or kV according to
patient size and/or use of iterative reconstruction technique.

CONTRAST:  100mL OMNIPAQUE IOHEXOL 350 MG/ML SOLN
FINDINGS: CTA CHEST FINDINGS

Cardiovascular: Normal heart size. No pericardial effusion.
Three-vessel coronary artery calcifications. No filling defects of
the central pulmonary arteries.

Mediastinum/Nodes: Esophagus and thyroid are unremarkable. No
pathologically enlarged lymph nodes seen in the chest.

Lungs/Pleura: Central airways are patent. Centrilobular and
paraseptal emphysema. Bibasilar atelectasis. No consolidation,
pleural effusion or pneumothorax.

Musculoskeletal: No chest wall abnormality. No acute or significant
osseous findings.

Review of the MIP images confirms the above findings.

CTA ABDOMEN AND PELVIS FINDINGS

VASCULAR

Aorta: Moderate atherosclerotic disease of the aorta consisting of
calcified and noncalcified plaque. Tricuspid aortic valve with
normal caliber aortic root. Dilated ascending thoracic aorta,
measuring 4.2 x 4.1 cm at the level of the main pulmonary artery.
Remainder of the thoracic aorta is normal in caliber. Major arch
vessels are widely patent. Normal caliber suprarenal abdominal
aorta. Abdominal aortic aneurysm with associated mural thrombus of
the infrarenal abdominal aorta measuring 6.2 x 5.7 cm in maximum
axial dimension and approximately 13.0 cm in length. Previously
measured 5.6 x 5.2 cm on prior CT of the abdomen and pelvis dated
December 04, 2019.

Celiac: Normal in caliber with no significant stenosis.

SMA: Normal in caliber with no significant stenosis.

Renals: Normal in caliber with no significant stenosis.

IMA: Occluded at the origin with distal reconstitution of flow via
collaterals.

Inflow: Dilated bilateral common iliac arteries. Right common iliac
artery measures 2.0 x 2.0 cm. Left common iliac artery measures
x 2.6 cm. Multifocal moderate to severe narrowing of the bilateral
internal iliac arteries. Bilateral external iliac arteries normal in
caliber with moderate atherosclerotic disease causing mild
multifocal narrowing.

Veins: No obvious venous abnormality within the limitations of this
arterial phase study.

Review of the MIP images confirms the above findings.

NON-VASCULAR

Hepatobiliary: No focal liver abnormality is seen. No gallstones,
gallbladder wall thickening, or biliary dilatation.

Pancreas: Unremarkable. No pancreatic ductal dilatation or
surrounding inflammatory changes.

Spleen: Normal in size without focal abnormality.

Adrenals/Urinary Tract: Adrenal glands are unremarkable. Kidneys are
normal, without renal calculi, focal lesion, or hydronephrosis.
Bladder is unremarkable.

Stomach/Bowel: Stomach is within normal limits. Appendix appears
normal. No evidence of bowel wall thickening, distention, or
inflammatory changes.

Lymphatic: No enlarged lymph nodes in the abdomen or pelvis.

Reproductive: Prostate is unremarkable.

Other: No abdominal wall hernia or abnormality. No abdominopelvic
ascites.

Musculoskeletal: No acute or significant osseous findings.

Review of the MIP images confirms the above findings.
IMPRESSION: 1. Fusiform infrarenal abdominal aortic aneurysm measuring 6.3 cm in
greatest axial dimension, previously measured up to 5.6 cm on prior
5158 exam. No evidence of impending rupture. Recommend referral to a
vascular specialist. This recommendation follows ACR consensus
guidelines: White Paper of the ACR Incidental Findings Committee II
on Vascular Findings. [HOSPITAL] 7246; [DATE].
2. Dilated bilateral common iliac arteries measuring up to 2.0 cm on
the right and 2.6 cm on the left.
3. Dilated ascending thoracic aorta measuring up to 4.2 cm.
Recommend annual imaging followup by CTA or MRA. This recommendation
follows 7373 ACCF/AHA/AATS/ACR/ASA/SCA/GEOVANNY/PO/NURTALAP/SHIMMY Guidelines
for the Diagnosis and Management of Patients with Thoracic Aortic
Disease. Circulation. 7373; 121: E266-e369. Aortic aneurysm NOS
(UKK27-J4L.I)
4. Aortic Atherosclerosis (UKK27-GWD.D) and Emphysema (UKK27-MMD.A).
